# Patient Record
Sex: Female | Born: 1940 | Race: White | Hispanic: No | Smoking: Former smoker
Health system: Southern US, Community
[De-identification: ages and names within clinical notes are randomized; demographics above are authoritative.]

## PROBLEM LIST (undated history)

## (undated) DIAGNOSIS — I071 Rheumatic tricuspid insufficiency: Secondary | ICD-10-CM

## (undated) DIAGNOSIS — R059 Cough, unspecified: Secondary | ICD-10-CM

## (undated) DIAGNOSIS — J42 Unspecified chronic bronchitis: Secondary | ICD-10-CM

## (undated) DIAGNOSIS — I4819 Other persistent atrial fibrillation: Secondary | ICD-10-CM

## (undated) DIAGNOSIS — M1990 Unspecified osteoarthritis, unspecified site: Secondary | ICD-10-CM

## (undated) DIAGNOSIS — R05 Cough: Secondary | ICD-10-CM

## (undated) DIAGNOSIS — M810 Age-related osteoporosis without current pathological fracture: Secondary | ICD-10-CM

## (undated) DIAGNOSIS — R0600 Dyspnea, unspecified: Secondary | ICD-10-CM

## (undated) DIAGNOSIS — J449 Chronic obstructive pulmonary disease, unspecified: Secondary | ICD-10-CM

## (undated) DIAGNOSIS — T7840XA Allergy, unspecified, initial encounter: Secondary | ICD-10-CM

## (undated) DIAGNOSIS — J8410 Pulmonary fibrosis, unspecified: Secondary | ICD-10-CM

## (undated) DIAGNOSIS — I10 Essential (primary) hypertension: Secondary | ICD-10-CM

## (undated) HISTORY — DX: Allergy, unspecified, initial encounter: T78.40XA

## (undated) HISTORY — DX: Pulmonary fibrosis, unspecified: J84.10

## (undated) HISTORY — DX: Essential (primary) hypertension: I10

## (undated) HISTORY — DX: Age-related osteoporosis without current pathological fracture: M81.0

## (undated) HISTORY — PX: VARICOSE VEIN SURGERY: SHX832

## (undated) HISTORY — DX: Other persistent atrial fibrillation: I48.19

## (undated) HISTORY — DX: Rheumatic tricuspid insufficiency: I07.1

## (undated) HISTORY — PX: EYE SURGERY: SHX253

## (undated) HISTORY — PX: CAPSULOTOMY: SHX379

## (undated) HISTORY — PX: DILATION AND CURETTAGE OF UTERUS: SHX78

## (undated) HISTORY — PX: BREAST SURGERY: SHX581

---

## 1994-10-01 HISTORY — PX: BREAST EXCISIONAL BIOPSY: SUR124

## 2001-06-17 ENCOUNTER — Other Ambulatory Visit: Admission: RE | Admit: 2001-06-17 | Discharge: 2001-06-17 | Payer: Self-pay | Admitting: Family Medicine

## 2006-02-15 ENCOUNTER — Ambulatory Visit: Payer: Self-pay | Admitting: General Surgery

## 2006-02-15 HISTORY — PX: COLONOSCOPY: SHX174

## 2006-02-19 ENCOUNTER — Ambulatory Visit: Payer: Self-pay | Admitting: Family Medicine

## 2006-09-11 ENCOUNTER — Ambulatory Visit: Payer: Self-pay | Admitting: Specialist

## 2007-10-29 ENCOUNTER — Ambulatory Visit: Payer: Self-pay | Admitting: Internal Medicine

## 2007-11-13 ENCOUNTER — Ambulatory Visit: Payer: Self-pay | Admitting: Internal Medicine

## 2007-11-28 ENCOUNTER — Ambulatory Visit: Payer: Self-pay | Admitting: Internal Medicine

## 2008-01-26 ENCOUNTER — Ambulatory Visit: Payer: Self-pay | Admitting: Internal Medicine

## 2008-03-09 ENCOUNTER — Ambulatory Visit: Payer: Self-pay | Admitting: Family Medicine

## 2008-07-27 ENCOUNTER — Ambulatory Visit: Payer: Self-pay | Admitting: Internal Medicine

## 2009-04-28 ENCOUNTER — Ambulatory Visit: Payer: Self-pay | Admitting: Family Medicine

## 2010-09-05 ENCOUNTER — Ambulatory Visit: Payer: Self-pay | Admitting: Family Medicine

## 2011-10-05 DIAGNOSIS — M25519 Pain in unspecified shoulder: Secondary | ICD-10-CM | POA: Diagnosis not present

## 2011-10-05 DIAGNOSIS — M533 Sacrococcygeal disorders, not elsewhere classified: Secondary | ICD-10-CM | POA: Diagnosis not present

## 2011-10-05 DIAGNOSIS — M719 Bursopathy, unspecified: Secondary | ICD-10-CM | POA: Diagnosis not present

## 2011-10-05 DIAGNOSIS — M67919 Unspecified disorder of synovium and tendon, unspecified shoulder: Secondary | ICD-10-CM | POA: Diagnosis not present

## 2011-10-05 DIAGNOSIS — S20229A Contusion of unspecified back wall of thorax, initial encounter: Secondary | ICD-10-CM | POA: Diagnosis not present

## 2011-10-15 ENCOUNTER — Ambulatory Visit: Payer: Self-pay | Admitting: Family Medicine

## 2011-10-15 DIAGNOSIS — Z1231 Encounter for screening mammogram for malignant neoplasm of breast: Secondary | ICD-10-CM | POA: Diagnosis not present

## 2011-10-25 DIAGNOSIS — R05 Cough: Secondary | ICD-10-CM | POA: Diagnosis not present

## 2011-10-25 DIAGNOSIS — J309 Allergic rhinitis, unspecified: Secondary | ICD-10-CM | POA: Diagnosis not present

## 2011-10-25 DIAGNOSIS — J8410 Pulmonary fibrosis, unspecified: Secondary | ICD-10-CM | POA: Diagnosis not present

## 2011-10-31 DIAGNOSIS — R0602 Shortness of breath: Secondary | ICD-10-CM | POA: Diagnosis not present

## 2011-11-02 DIAGNOSIS — M6281 Muscle weakness (generalized): Secondary | ICD-10-CM | POA: Diagnosis not present

## 2011-11-02 DIAGNOSIS — M67919 Unspecified disorder of synovium and tendon, unspecified shoulder: Secondary | ICD-10-CM | POA: Diagnosis not present

## 2011-11-02 DIAGNOSIS — M7530 Calcific tendinitis of unspecified shoulder: Secondary | ICD-10-CM | POA: Diagnosis not present

## 2011-11-02 DIAGNOSIS — M25519 Pain in unspecified shoulder: Secondary | ICD-10-CM | POA: Diagnosis not present

## 2011-11-09 DIAGNOSIS — M719 Bursopathy, unspecified: Secondary | ICD-10-CM | POA: Diagnosis not present

## 2011-11-09 DIAGNOSIS — M25519 Pain in unspecified shoulder: Secondary | ICD-10-CM | POA: Diagnosis not present

## 2011-11-09 DIAGNOSIS — M6281 Muscle weakness (generalized): Secondary | ICD-10-CM | POA: Diagnosis not present

## 2011-11-09 DIAGNOSIS — M25619 Stiffness of unspecified shoulder, not elsewhere classified: Secondary | ICD-10-CM | POA: Diagnosis not present

## 2011-11-13 DIAGNOSIS — J019 Acute sinusitis, unspecified: Secondary | ICD-10-CM | POA: Diagnosis not present

## 2011-11-13 DIAGNOSIS — R05 Cough: Secondary | ICD-10-CM | POA: Diagnosis not present

## 2011-11-13 DIAGNOSIS — J309 Allergic rhinitis, unspecified: Secondary | ICD-10-CM | POA: Diagnosis not present

## 2011-11-16 DIAGNOSIS — M6281 Muscle weakness (generalized): Secondary | ICD-10-CM | POA: Diagnosis not present

## 2011-11-16 DIAGNOSIS — M25519 Pain in unspecified shoulder: Secondary | ICD-10-CM | POA: Diagnosis not present

## 2011-11-16 DIAGNOSIS — M25619 Stiffness of unspecified shoulder, not elsewhere classified: Secondary | ICD-10-CM | POA: Diagnosis not present

## 2011-11-20 DIAGNOSIS — M67919 Unspecified disorder of synovium and tendon, unspecified shoulder: Secondary | ICD-10-CM | POA: Diagnosis not present

## 2011-11-20 DIAGNOSIS — M25519 Pain in unspecified shoulder: Secondary | ICD-10-CM | POA: Diagnosis not present

## 2011-11-20 DIAGNOSIS — M6281 Muscle weakness (generalized): Secondary | ICD-10-CM | POA: Diagnosis not present

## 2011-11-20 DIAGNOSIS — M25619 Stiffness of unspecified shoulder, not elsewhere classified: Secondary | ICD-10-CM | POA: Diagnosis not present

## 2011-11-20 DIAGNOSIS — M719 Bursopathy, unspecified: Secondary | ICD-10-CM | POA: Diagnosis not present

## 2011-11-23 DIAGNOSIS — M25619 Stiffness of unspecified shoulder, not elsewhere classified: Secondary | ICD-10-CM | POA: Diagnosis not present

## 2011-11-23 DIAGNOSIS — M67919 Unspecified disorder of synovium and tendon, unspecified shoulder: Secondary | ICD-10-CM | POA: Diagnosis not present

## 2011-11-23 DIAGNOSIS — M25519 Pain in unspecified shoulder: Secondary | ICD-10-CM | POA: Diagnosis not present

## 2011-11-23 DIAGNOSIS — M6281 Muscle weakness (generalized): Secondary | ICD-10-CM | POA: Diagnosis not present

## 2011-11-30 DIAGNOSIS — J309 Allergic rhinitis, unspecified: Secondary | ICD-10-CM | POA: Diagnosis not present

## 2011-11-30 DIAGNOSIS — J069 Acute upper respiratory infection, unspecified: Secondary | ICD-10-CM | POA: Diagnosis not present

## 2011-11-30 DIAGNOSIS — R05 Cough: Secondary | ICD-10-CM | POA: Diagnosis not present

## 2011-12-11 DIAGNOSIS — J309 Allergic rhinitis, unspecified: Secondary | ICD-10-CM | POA: Diagnosis not present

## 2011-12-12 DIAGNOSIS — M7530 Calcific tendinitis of unspecified shoulder: Secondary | ICD-10-CM | POA: Diagnosis not present

## 2011-12-12 DIAGNOSIS — M6281 Muscle weakness (generalized): Secondary | ICD-10-CM | POA: Diagnosis not present

## 2011-12-12 DIAGNOSIS — M25519 Pain in unspecified shoulder: Secondary | ICD-10-CM | POA: Diagnosis not present

## 2011-12-12 DIAGNOSIS — M719 Bursopathy, unspecified: Secondary | ICD-10-CM | POA: Diagnosis not present

## 2011-12-12 DIAGNOSIS — M67919 Unspecified disorder of synovium and tendon, unspecified shoulder: Secondary | ICD-10-CM | POA: Diagnosis not present

## 2012-01-24 DIAGNOSIS — J309 Allergic rhinitis, unspecified: Secondary | ICD-10-CM | POA: Diagnosis not present

## 2012-01-24 DIAGNOSIS — J8410 Pulmonary fibrosis, unspecified: Secondary | ICD-10-CM | POA: Diagnosis not present

## 2012-01-24 DIAGNOSIS — R05 Cough: Secondary | ICD-10-CM | POA: Diagnosis not present

## 2012-02-14 DIAGNOSIS — J309 Allergic rhinitis, unspecified: Secondary | ICD-10-CM | POA: Diagnosis not present

## 2012-03-12 DIAGNOSIS — J309 Allergic rhinitis, unspecified: Secondary | ICD-10-CM | POA: Diagnosis not present

## 2012-03-31 DIAGNOSIS — R05 Cough: Secondary | ICD-10-CM | POA: Diagnosis not present

## 2012-03-31 DIAGNOSIS — J309 Allergic rhinitis, unspecified: Secondary | ICD-10-CM | POA: Diagnosis not present

## 2012-03-31 DIAGNOSIS — J019 Acute sinusitis, unspecified: Secondary | ICD-10-CM | POA: Diagnosis not present

## 2012-04-08 DIAGNOSIS — J019 Acute sinusitis, unspecified: Secondary | ICD-10-CM | POA: Diagnosis not present

## 2012-04-08 DIAGNOSIS — R05 Cough: Secondary | ICD-10-CM | POA: Diagnosis not present

## 2012-04-24 DIAGNOSIS — J309 Allergic rhinitis, unspecified: Secondary | ICD-10-CM | POA: Diagnosis not present

## 2012-05-06 DIAGNOSIS — J309 Allergic rhinitis, unspecified: Secondary | ICD-10-CM | POA: Diagnosis not present

## 2012-06-27 DIAGNOSIS — Z23 Encounter for immunization: Secondary | ICD-10-CM | POA: Diagnosis not present

## 2012-08-20 DIAGNOSIS — J309 Allergic rhinitis, unspecified: Secondary | ICD-10-CM | POA: Diagnosis not present

## 2012-08-20 DIAGNOSIS — J8410 Pulmonary fibrosis, unspecified: Secondary | ICD-10-CM | POA: Diagnosis not present

## 2012-09-11 DIAGNOSIS — M25559 Pain in unspecified hip: Secondary | ICD-10-CM | POA: Diagnosis not present

## 2012-09-11 DIAGNOSIS — G5600 Carpal tunnel syndrome, unspecified upper limb: Secondary | ICD-10-CM | POA: Diagnosis not present

## 2012-09-11 DIAGNOSIS — M545 Low back pain: Secondary | ICD-10-CM | POA: Diagnosis not present

## 2012-09-11 DIAGNOSIS — M19049 Primary osteoarthritis, unspecified hand: Secondary | ICD-10-CM | POA: Diagnosis not present

## 2012-10-14 DIAGNOSIS — J309 Allergic rhinitis, unspecified: Secondary | ICD-10-CM | POA: Diagnosis not present

## 2012-10-14 DIAGNOSIS — R05 Cough: Secondary | ICD-10-CM | POA: Diagnosis not present

## 2012-10-14 DIAGNOSIS — J069 Acute upper respiratory infection, unspecified: Secondary | ICD-10-CM | POA: Diagnosis not present

## 2012-10-16 DIAGNOSIS — H35379 Puckering of macula, unspecified eye: Secondary | ICD-10-CM | POA: Diagnosis not present

## 2012-10-31 DIAGNOSIS — M545 Low back pain: Secondary | ICD-10-CM | POA: Diagnosis not present

## 2012-10-31 DIAGNOSIS — M25549 Pain in joints of unspecified hand: Secondary | ICD-10-CM | POA: Diagnosis not present

## 2012-10-31 DIAGNOSIS — G5600 Carpal tunnel syndrome, unspecified upper limb: Secondary | ICD-10-CM | POA: Diagnosis not present

## 2012-11-07 DIAGNOSIS — H35379 Puckering of macula, unspecified eye: Secondary | ICD-10-CM | POA: Diagnosis not present

## 2012-11-20 ENCOUNTER — Encounter: Payer: Self-pay | Admitting: Rheumatology

## 2012-11-20 ENCOUNTER — Ambulatory Visit: Payer: Self-pay | Admitting: Family Medicine

## 2012-11-20 DIAGNOSIS — Z1231 Encounter for screening mammogram for malignant neoplasm of breast: Secondary | ICD-10-CM | POA: Diagnosis not present

## 2012-11-28 DIAGNOSIS — M19049 Primary osteoarthritis, unspecified hand: Secondary | ICD-10-CM | POA: Diagnosis not present

## 2012-11-29 ENCOUNTER — Encounter: Payer: Self-pay | Admitting: Rheumatology

## 2013-02-09 DIAGNOSIS — H35379 Puckering of macula, unspecified eye: Secondary | ICD-10-CM | POA: Diagnosis not present

## 2013-02-12 DIAGNOSIS — H35379 Puckering of macula, unspecified eye: Secondary | ICD-10-CM | POA: Diagnosis not present

## 2013-02-25 ENCOUNTER — Ambulatory Visit: Payer: Self-pay | Admitting: Internal Medicine

## 2013-02-25 DIAGNOSIS — R918 Other nonspecific abnormal finding of lung field: Secondary | ICD-10-CM | POA: Diagnosis not present

## 2013-02-25 DIAGNOSIS — J8410 Pulmonary fibrosis, unspecified: Secondary | ICD-10-CM | POA: Diagnosis not present

## 2013-02-25 DIAGNOSIS — J309 Allergic rhinitis, unspecified: Secondary | ICD-10-CM | POA: Diagnosis not present

## 2013-02-25 DIAGNOSIS — R062 Wheezing: Secondary | ICD-10-CM | POA: Diagnosis not present

## 2013-02-25 DIAGNOSIS — J069 Acute upper respiratory infection, unspecified: Secondary | ICD-10-CM | POA: Diagnosis not present

## 2013-03-04 DIAGNOSIS — J020 Streptococcal pharyngitis: Secondary | ICD-10-CM | POA: Diagnosis not present

## 2013-03-04 DIAGNOSIS — J069 Acute upper respiratory infection, unspecified: Secondary | ICD-10-CM | POA: Diagnosis not present

## 2013-03-25 DIAGNOSIS — M779 Enthesopathy, unspecified: Secondary | ICD-10-CM | POA: Diagnosis not present

## 2013-03-25 DIAGNOSIS — M79609 Pain in unspecified limb: Secondary | ICD-10-CM | POA: Diagnosis not present

## 2013-04-02 DIAGNOSIS — M24873 Other specific joint derangements of unspecified ankle, not elsewhere classified: Secondary | ICD-10-CM | POA: Diagnosis not present

## 2013-04-02 DIAGNOSIS — M24876 Other specific joint derangements of unspecified foot, not elsewhere classified: Secondary | ICD-10-CM | POA: Diagnosis not present

## 2013-04-02 DIAGNOSIS — M76829 Posterior tibial tendinitis, unspecified leg: Secondary | ICD-10-CM | POA: Diagnosis not present

## 2013-04-02 DIAGNOSIS — M79609 Pain in unspecified limb: Secondary | ICD-10-CM | POA: Diagnosis not present

## 2013-04-16 DIAGNOSIS — M659 Synovitis and tenosynovitis, unspecified: Secondary | ICD-10-CM | POA: Diagnosis not present

## 2013-04-16 DIAGNOSIS — M79609 Pain in unspecified limb: Secondary | ICD-10-CM | POA: Diagnosis not present

## 2013-05-27 DIAGNOSIS — Z23 Encounter for immunization: Secondary | ICD-10-CM | POA: Diagnosis not present

## 2013-05-27 DIAGNOSIS — Z0000 Encounter for general adult medical examination without abnormal findings: Secondary | ICD-10-CM | POA: Diagnosis not present

## 2013-06-08 DIAGNOSIS — I10 Essential (primary) hypertension: Secondary | ICD-10-CM | POA: Diagnosis not present

## 2013-06-08 DIAGNOSIS — H35379 Puckering of macula, unspecified eye: Secondary | ICD-10-CM | POA: Diagnosis not present

## 2013-06-08 DIAGNOSIS — J309 Allergic rhinitis, unspecified: Secondary | ICD-10-CM | POA: Diagnosis not present

## 2013-06-08 DIAGNOSIS — M810 Age-related osteoporosis without current pathological fracture: Secondary | ICD-10-CM | POA: Diagnosis not present

## 2013-06-09 ENCOUNTER — Ambulatory Visit: Payer: Self-pay | Admitting: Family Medicine

## 2013-06-09 DIAGNOSIS — M810 Age-related osteoporosis without current pathological fracture: Secondary | ICD-10-CM | POA: Diagnosis not present

## 2013-06-18 LAB — LIPID PANEL
Cholesterol: 179 mg/dL (ref 0–200)
HDL: 66 mg/dL (ref 35–70)
LDL Cholesterol: 94 mg/dL
LDL/HDL RATIO: 1.4
TRIGLYCERIDES: 93 mg/dL (ref 40–160)

## 2013-06-18 LAB — BASIC METABOLIC PANEL
BUN: 13 mg/dL (ref 4–21)
Creatinine: 0.7 mg/dL (ref 0.5–1.1)
Glucose: 90 mg/dL
POTASSIUM: 4.6 mmol/L (ref 3.4–5.3)
Sodium: 137 mmol/L (ref 137–147)

## 2013-06-18 LAB — CBC AND DIFFERENTIAL
HCT: 45 % (ref 36–46)
HEMOGLOBIN: 15.1 g/dL (ref 12.0–16.0)
Neutrophils Absolute: 6 /uL
Platelets: 300 10*3/uL (ref 150–399)
WBC: 9.1 10^3/mL

## 2013-06-18 LAB — HEPATIC FUNCTION PANEL
ALK PHOS: 35 U/L (ref 25–125)
ALT: 8 U/L (ref 7–35)
AST: 18 U/L (ref 13–35)
BILIRUBIN, TOTAL: 1 mg/dL

## 2013-06-18 LAB — TSH: TSH: 2.15 u[IU]/mL (ref 0.41–5.90)

## 2013-07-16 ENCOUNTER — Ambulatory Visit: Payer: Self-pay | Admitting: Ophthalmology

## 2013-07-16 DIAGNOSIS — Z79899 Other long term (current) drug therapy: Secondary | ICD-10-CM | POA: Diagnosis not present

## 2013-07-16 DIAGNOSIS — Z01812 Encounter for preprocedural laboratory examination: Secondary | ICD-10-CM | POA: Diagnosis not present

## 2013-07-16 DIAGNOSIS — Z01810 Encounter for preprocedural cardiovascular examination: Secondary | ICD-10-CM | POA: Diagnosis not present

## 2013-07-16 DIAGNOSIS — I10 Essential (primary) hypertension: Secondary | ICD-10-CM | POA: Diagnosis not present

## 2013-07-16 DIAGNOSIS — Z23 Encounter for immunization: Secondary | ICD-10-CM | POA: Diagnosis not present

## 2013-07-16 LAB — POTASSIUM: Potassium: 4.4 mmol/L (ref 3.5–5.1)

## 2013-07-29 ENCOUNTER — Ambulatory Visit: Payer: Self-pay | Admitting: Ophthalmology

## 2013-07-29 DIAGNOSIS — H35379 Puckering of macula, unspecified eye: Secondary | ICD-10-CM | POA: Diagnosis not present

## 2013-07-29 DIAGNOSIS — M810 Age-related osteoporosis without current pathological fracture: Secondary | ICD-10-CM | POA: Diagnosis not present

## 2013-07-29 DIAGNOSIS — M129 Arthropathy, unspecified: Secondary | ICD-10-CM | POA: Diagnosis not present

## 2013-07-29 DIAGNOSIS — Z79899 Other long term (current) drug therapy: Secondary | ICD-10-CM | POA: Diagnosis not present

## 2013-07-29 DIAGNOSIS — I10 Essential (primary) hypertension: Secondary | ICD-10-CM | POA: Diagnosis not present

## 2013-07-29 DIAGNOSIS — H3581 Retinal edema: Secondary | ICD-10-CM | POA: Diagnosis not present

## 2013-07-29 DIAGNOSIS — J8410 Pulmonary fibrosis, unspecified: Secondary | ICD-10-CM | POA: Diagnosis not present

## 2013-08-07 DIAGNOSIS — H35379 Puckering of macula, unspecified eye: Secondary | ICD-10-CM | POA: Diagnosis not present

## 2013-09-07 DIAGNOSIS — J309 Allergic rhinitis, unspecified: Secondary | ICD-10-CM | POA: Diagnosis not present

## 2013-09-07 DIAGNOSIS — J8410 Pulmonary fibrosis, unspecified: Secondary | ICD-10-CM | POA: Diagnosis not present

## 2013-09-14 DIAGNOSIS — H35379 Puckering of macula, unspecified eye: Secondary | ICD-10-CM | POA: Diagnosis not present

## 2013-10-14 DIAGNOSIS — J309 Allergic rhinitis, unspecified: Secondary | ICD-10-CM | POA: Diagnosis not present

## 2013-10-14 DIAGNOSIS — R05 Cough: Secondary | ICD-10-CM | POA: Diagnosis not present

## 2013-10-14 DIAGNOSIS — R059 Cough, unspecified: Secondary | ICD-10-CM | POA: Diagnosis not present

## 2013-10-14 DIAGNOSIS — J069 Acute upper respiratory infection, unspecified: Secondary | ICD-10-CM | POA: Diagnosis not present

## 2013-10-14 DIAGNOSIS — J42 Unspecified chronic bronchitis: Secondary | ICD-10-CM | POA: Diagnosis not present

## 2013-11-13 DIAGNOSIS — H353190 Nonexudative age-related macular degeneration, unspecified eye, stage unspecified: Secondary | ICD-10-CM | POA: Diagnosis not present

## 2014-01-13 DIAGNOSIS — M1990 Unspecified osteoarthritis, unspecified site: Secondary | ICD-10-CM | POA: Diagnosis not present

## 2014-01-13 DIAGNOSIS — J309 Allergic rhinitis, unspecified: Secondary | ICD-10-CM | POA: Diagnosis not present

## 2014-02-16 DIAGNOSIS — H3581 Retinal edema: Secondary | ICD-10-CM | POA: Diagnosis not present

## 2014-03-03 ENCOUNTER — Ambulatory Visit: Payer: Self-pay | Admitting: Family Medicine

## 2014-03-03 DIAGNOSIS — Z1231 Encounter for screening mammogram for malignant neoplasm of breast: Secondary | ICD-10-CM | POA: Diagnosis not present

## 2014-03-08 DIAGNOSIS — J309 Allergic rhinitis, unspecified: Secondary | ICD-10-CM | POA: Diagnosis not present

## 2014-03-08 DIAGNOSIS — J8410 Pulmonary fibrosis, unspecified: Secondary | ICD-10-CM | POA: Diagnosis not present

## 2014-03-12 DIAGNOSIS — H2510 Age-related nuclear cataract, unspecified eye: Secondary | ICD-10-CM | POA: Diagnosis not present

## 2014-07-08 DIAGNOSIS — J209 Acute bronchitis, unspecified: Secondary | ICD-10-CM | POA: Diagnosis not present

## 2014-07-08 DIAGNOSIS — J301 Allergic rhinitis due to pollen: Secondary | ICD-10-CM | POA: Diagnosis not present

## 2014-07-08 DIAGNOSIS — J8410 Pulmonary fibrosis, unspecified: Secondary | ICD-10-CM | POA: Diagnosis not present

## 2014-07-19 ENCOUNTER — Ambulatory Visit: Payer: Self-pay | Admitting: Internal Medicine

## 2014-07-19 DIAGNOSIS — R0602 Shortness of breath: Secondary | ICD-10-CM | POA: Diagnosis not present

## 2014-07-19 DIAGNOSIS — J209 Acute bronchitis, unspecified: Secondary | ICD-10-CM | POA: Diagnosis not present

## 2014-07-19 DIAGNOSIS — R918 Other nonspecific abnormal finding of lung field: Secondary | ICD-10-CM | POA: Diagnosis not present

## 2014-07-19 DIAGNOSIS — J8410 Pulmonary fibrosis, unspecified: Secondary | ICD-10-CM | POA: Diagnosis not present

## 2014-07-19 DIAGNOSIS — R05 Cough: Secondary | ICD-10-CM | POA: Diagnosis not present

## 2014-07-26 DIAGNOSIS — J209 Acute bronchitis, unspecified: Secondary | ICD-10-CM | POA: Diagnosis not present

## 2014-07-26 DIAGNOSIS — J8410 Pulmonary fibrosis, unspecified: Secondary | ICD-10-CM | POA: Diagnosis not present

## 2014-07-26 DIAGNOSIS — Z23 Encounter for immunization: Secondary | ICD-10-CM | POA: Diagnosis not present

## 2014-07-26 DIAGNOSIS — J181 Lobar pneumonia, unspecified organism: Secondary | ICD-10-CM | POA: Diagnosis not present

## 2014-08-09 ENCOUNTER — Ambulatory Visit: Payer: Self-pay | Admitting: Internal Medicine

## 2014-08-09 DIAGNOSIS — M8588 Other specified disorders of bone density and structure, other site: Secondary | ICD-10-CM | POA: Diagnosis not present

## 2014-08-09 DIAGNOSIS — J189 Pneumonia, unspecified organism: Secondary | ICD-10-CM | POA: Diagnosis not present

## 2014-08-09 DIAGNOSIS — R062 Wheezing: Secondary | ICD-10-CM | POA: Diagnosis not present

## 2014-08-19 DIAGNOSIS — H2513 Age-related nuclear cataract, bilateral: Secondary | ICD-10-CM | POA: Diagnosis not present

## 2014-08-19 DIAGNOSIS — H3581 Retinal edema: Secondary | ICD-10-CM | POA: Diagnosis not present

## 2014-10-04 DIAGNOSIS — J301 Allergic rhinitis due to pollen: Secondary | ICD-10-CM | POA: Diagnosis not present

## 2014-10-04 DIAGNOSIS — R05 Cough: Secondary | ICD-10-CM | POA: Diagnosis not present

## 2014-10-04 DIAGNOSIS — J8410 Pulmonary fibrosis, unspecified: Secondary | ICD-10-CM | POA: Diagnosis not present

## 2014-10-21 DIAGNOSIS — Z23 Encounter for immunization: Secondary | ICD-10-CM | POA: Diagnosis not present

## 2014-10-21 DIAGNOSIS — R0602 Shortness of breath: Secondary | ICD-10-CM | POA: Diagnosis not present

## 2014-10-21 DIAGNOSIS — R05 Cough: Secondary | ICD-10-CM | POA: Diagnosis not present

## 2014-10-21 DIAGNOSIS — J181 Lobar pneumonia, unspecified organism: Secondary | ICD-10-CM | POA: Diagnosis not present

## 2014-11-04 DIAGNOSIS — J8410 Pulmonary fibrosis, unspecified: Secondary | ICD-10-CM | POA: Diagnosis not present

## 2014-11-04 DIAGNOSIS — R05 Cough: Secondary | ICD-10-CM | POA: Diagnosis not present

## 2014-11-04 DIAGNOSIS — R0602 Shortness of breath: Secondary | ICD-10-CM | POA: Diagnosis not present

## 2015-01-05 DIAGNOSIS — J8410 Pulmonary fibrosis, unspecified: Secondary | ICD-10-CM | POA: Diagnosis not present

## 2015-01-05 DIAGNOSIS — J0191 Acute recurrent sinusitis, unspecified: Secondary | ICD-10-CM | POA: Diagnosis not present

## 2015-01-05 DIAGNOSIS — J301 Allergic rhinitis due to pollen: Secondary | ICD-10-CM | POA: Diagnosis not present

## 2015-01-10 ENCOUNTER — Ambulatory Visit
Admit: 2015-01-10 | Disposition: A | Discharge status: Home / Self Care | Payer: Self-pay | Attending: Internal Medicine | Admitting: Internal Medicine

## 2015-01-10 DIAGNOSIS — R0602 Shortness of breath: Secondary | ICD-10-CM | POA: Diagnosis not present

## 2015-01-10 DIAGNOSIS — R918 Other nonspecific abnormal finding of lung field: Secondary | ICD-10-CM | POA: Diagnosis not present

## 2015-01-10 DIAGNOSIS — J8410 Pulmonary fibrosis, unspecified: Secondary | ICD-10-CM | POA: Diagnosis not present

## 2015-01-10 DIAGNOSIS — R05 Cough: Secondary | ICD-10-CM | POA: Diagnosis not present

## 2015-01-17 DIAGNOSIS — J8410 Pulmonary fibrosis, unspecified: Secondary | ICD-10-CM | POA: Diagnosis not present

## 2015-01-17 DIAGNOSIS — J181 Lobar pneumonia, unspecified organism: Secondary | ICD-10-CM | POA: Diagnosis not present

## 2015-01-17 DIAGNOSIS — J301 Allergic rhinitis due to pollen: Secondary | ICD-10-CM | POA: Diagnosis not present

## 2015-01-17 DIAGNOSIS — R0602 Shortness of breath: Secondary | ICD-10-CM | POA: Diagnosis not present

## 2015-01-21 NOTE — Op Note (Signed)
PATIENT NAME:  Karen Henson, Karen Henson MR#:  [id number] DATE OF BIRTH:  1941/05/12  DATE OF PROCEDURE:  07/29/2013  PROCEDURE PERFORMED: 1.  Pars plana vitrectomy of the left eye.  2.  Internal limiting membrane peel of the left eye.   PREOPERATIVE DIAGNOSES: 1.  Epiretinal membrane.  2.  Retinal edema.   POSTOPERATIVE DIAGNOSES: 1.  Epiretinal membrane.  2.  Retinal edema.    PRIMARY SURGEON: Teresa Pelton. Starling Manns, M.D.   ANESTHESIA: Retrobulbar block of the left eye with monitored anesthesia care.   COMPLICATIONS: None.   ESTIMATED BLOOD LOSS: Less than 1 mL.   INDICATIONS FOR PROCEDURE: The patient presented to my office with slowly decreasing vision in the left eye to 20/50 or worse. The patient noted increasing difficulty with reading. Examination revealed an epiretinal membrane with associated retinal edema. Risks, benefits, and alternatives of the above procedure were discussed and the patient wished to proceed.   DETAILS OF PROCEDURE: After informed consent was obtained the patient was brought into the operative suite at [facility]. The patient was placed in supine position. A small dose of Alfenta and was given to the patient by anesthesia. A retrobulbar block was performed on the left eye by the primary surgeon without any complications. The left eye was prepped and draped in sterile manner. After lid speculum was inserted, a 25-gauge trocar was placed inferotemporally through displaced conjunctiva 4 mm beyond the limbus in an oblique fashion. The infusion cannula was turned on and inserted through the trocar and secured in position with Steri-Strips. Two more trocars were placed in a similar fashion superotemporally and superonasally. The vitreous cutter and light pipe were introduced into the eye and a core vitrectomy was performed. Vitreous face was engaged using suction and elevated off of the retina. The vitreous face was removed and the peripheral vitreous was  trimmed for 360 degrees indocyanine green was injected onto the posterior pole and removed within 30 seconds. An internal limiting membrane peel was performed for 360 degrees around the fovea for a total diameter of two disk diameters. A scleral depressed exam was performed for 360 degrees and no signs of any breaks, tears, or retinal detachment could be identified. A complete air-fluid exchange was performed. The trocars were removed and the wounds were noted to be airtight. Pressure in the eye was confirmed to be approximately 15 mmHg 5 mg of dexamethasone was given into the inferior fornix and the lid speculum was removed. The eye was cleaned and TobraDex was placed on the eye. A patch and shield were placed over the eye and the patient was taken to postanesthesia care with instructions to remain face down for today and then head up after that.    ____________________________ Teresa Pelton. Starling Manns, MD mfa:dp D: 07/29/2013 07:59:34 ET T: 07/29/2013 08:08:21 ET JOB#: [id number]  cc: Teresa Pelton. Starling Manns, MD, <Dictator> Coralee Rud MD ELECTRONICALLY SIGNED 09/02/2013 8:36

## 2015-02-02 DIAGNOSIS — R0602 Shortness of breath: Secondary | ICD-10-CM | POA: Diagnosis not present

## 2015-03-21 DIAGNOSIS — H2513 Age-related nuclear cataract, bilateral: Secondary | ICD-10-CM | POA: Diagnosis not present

## 2015-03-21 DIAGNOSIS — H35372 Puckering of macula, left eye: Secondary | ICD-10-CM | POA: Diagnosis not present

## 2015-05-14 DIAGNOSIS — I10 Essential (primary) hypertension: Secondary | ICD-10-CM | POA: Diagnosis not present

## 2015-05-14 DIAGNOSIS — W19XXXA Unspecified fall, initial encounter: Secondary | ICD-10-CM | POA: Diagnosis not present

## 2015-05-14 DIAGNOSIS — Z6823 Body mass index (BMI) 23.0-23.9, adult: Secondary | ICD-10-CM | POA: Diagnosis not present

## 2015-05-14 DIAGNOSIS — M7989 Other specified soft tissue disorders: Secondary | ICD-10-CM | POA: Diagnosis not present

## 2015-05-14 DIAGNOSIS — S6991XA Unspecified injury of right wrist, hand and finger(s), initial encounter: Secondary | ICD-10-CM | POA: Diagnosis not present

## 2015-05-14 DIAGNOSIS — J8410 Pulmonary fibrosis, unspecified: Secondary | ICD-10-CM | POA: Diagnosis not present

## 2015-05-14 DIAGNOSIS — M79641 Pain in right hand: Secondary | ICD-10-CM | POA: Diagnosis not present

## 2015-06-16 DIAGNOSIS — J8410 Pulmonary fibrosis, unspecified: Secondary | ICD-10-CM | POA: Diagnosis not present

## 2015-06-16 DIAGNOSIS — R0602 Shortness of breath: Secondary | ICD-10-CM | POA: Diagnosis not present

## 2015-08-01 DIAGNOSIS — Z23 Encounter for immunization: Secondary | ICD-10-CM | POA: Diagnosis not present

## 2015-08-22 ENCOUNTER — Other Ambulatory Visit: Payer: Self-pay | Admitting: Family Medicine

## 2015-08-31 DIAGNOSIS — J8410 Pulmonary fibrosis, unspecified: Secondary | ICD-10-CM | POA: Diagnosis not present

## 2015-08-31 DIAGNOSIS — J301 Allergic rhinitis due to pollen: Secondary | ICD-10-CM | POA: Diagnosis not present

## 2015-08-31 DIAGNOSIS — J209 Acute bronchitis, unspecified: Secondary | ICD-10-CM | POA: Diagnosis not present

## 2015-09-28 DIAGNOSIS — J8410 Pulmonary fibrosis, unspecified: Secondary | ICD-10-CM | POA: Diagnosis not present

## 2015-09-28 DIAGNOSIS — R0602 Shortness of breath: Secondary | ICD-10-CM | POA: Diagnosis not present

## 2015-11-09 DIAGNOSIS — M810 Age-related osteoporosis without current pathological fracture: Secondary | ICD-10-CM | POA: Insufficient documentation

## 2015-11-09 DIAGNOSIS — J309 Allergic rhinitis, unspecified: Secondary | ICD-10-CM | POA: Insufficient documentation

## 2015-11-09 DIAGNOSIS — I10 Essential (primary) hypertension: Secondary | ICD-10-CM | POA: Insufficient documentation

## 2015-11-09 DIAGNOSIS — J8410 Pulmonary fibrosis, unspecified: Secondary | ICD-10-CM | POA: Insufficient documentation

## 2015-11-09 DIAGNOSIS — M1990 Unspecified osteoarthritis, unspecified site: Secondary | ICD-10-CM | POA: Insufficient documentation

## 2015-11-09 DIAGNOSIS — K5790 Diverticulosis of intestine, part unspecified, without perforation or abscess without bleeding: Secondary | ICD-10-CM | POA: Insufficient documentation

## 2015-11-28 DIAGNOSIS — R0602 Shortness of breath: Secondary | ICD-10-CM | POA: Diagnosis not present

## 2015-11-28 DIAGNOSIS — J301 Allergic rhinitis due to pollen: Secondary | ICD-10-CM | POA: Diagnosis not present

## 2015-11-28 DIAGNOSIS — R05 Cough: Secondary | ICD-10-CM | POA: Diagnosis not present

## 2015-11-28 DIAGNOSIS — J0191 Acute recurrent sinusitis, unspecified: Secondary | ICD-10-CM | POA: Diagnosis not present

## 2015-12-01 ENCOUNTER — Other Ambulatory Visit: Payer: Self-pay | Admitting: Internal Medicine

## 2015-12-01 ENCOUNTER — Ambulatory Visit
Admission: RE | Admit: 2015-12-01 | Discharge: 2015-12-01 | Disposition: A | Discharge status: Home / Self Care | Payer: Medicare Other | Source: Ambulatory Visit | Attending: Internal Medicine | Admitting: Internal Medicine

## 2015-12-01 ENCOUNTER — Ambulatory Visit: Admission: RE | Admit: 2015-12-01 | Payer: Medicare Other | Source: Ambulatory Visit

## 2015-12-01 DIAGNOSIS — J209 Acute bronchitis, unspecified: Secondary | ICD-10-CM | POA: Diagnosis not present

## 2015-12-01 DIAGNOSIS — R059 Cough, unspecified: Secondary | ICD-10-CM

## 2015-12-01 DIAGNOSIS — R05 Cough: Secondary | ICD-10-CM

## 2015-12-08 DIAGNOSIS — R0602 Shortness of breath: Secondary | ICD-10-CM | POA: Diagnosis not present

## 2015-12-08 DIAGNOSIS — J301 Allergic rhinitis due to pollen: Secondary | ICD-10-CM | POA: Diagnosis not present

## 2015-12-08 DIAGNOSIS — J8410 Pulmonary fibrosis, unspecified: Secondary | ICD-10-CM | POA: Diagnosis not present

## 2015-12-08 DIAGNOSIS — J209 Acute bronchitis, unspecified: Secondary | ICD-10-CM | POA: Diagnosis not present

## 2015-12-20 DIAGNOSIS — J8410 Pulmonary fibrosis, unspecified: Secondary | ICD-10-CM | POA: Diagnosis not present

## 2015-12-20 DIAGNOSIS — J301 Allergic rhinitis due to pollen: Secondary | ICD-10-CM | POA: Diagnosis not present

## 2015-12-20 DIAGNOSIS — J069 Acute upper respiratory infection, unspecified: Secondary | ICD-10-CM | POA: Diagnosis not present

## 2015-12-20 DIAGNOSIS — R0602 Shortness of breath: Secondary | ICD-10-CM | POA: Diagnosis not present

## 2015-12-30 ENCOUNTER — Encounter: Payer: Self-pay | Admitting: Family Medicine

## 2015-12-30 DIAGNOSIS — R05 Cough: Secondary | ICD-10-CM | POA: Diagnosis not present

## 2016-01-03 DIAGNOSIS — R0602 Shortness of breath: Secondary | ICD-10-CM | POA: Diagnosis not present

## 2016-01-03 DIAGNOSIS — J8410 Pulmonary fibrosis, unspecified: Secondary | ICD-10-CM | POA: Diagnosis not present

## 2016-01-03 DIAGNOSIS — J301 Allergic rhinitis due to pollen: Secondary | ICD-10-CM | POA: Diagnosis not present

## 2016-01-30 ENCOUNTER — Ambulatory Visit (INDEPENDENT_AMBULATORY_CARE_PROVIDER_SITE_OTHER): Payer: Medicare Other | Admitting: Family Medicine

## 2016-01-30 ENCOUNTER — Encounter: Payer: Self-pay | Admitting: Family Medicine

## 2016-01-30 VITALS — BP 120/86 | HR 68 | Temp 98.1°F | Resp 16 | Ht 62.0 in | Wt 132.0 lb

## 2016-01-30 DIAGNOSIS — Z0000 Encounter for general adult medical examination without abnormal findings: Secondary | ICD-10-CM

## 2016-01-30 DIAGNOSIS — N3001 Acute cystitis with hematuria: Secondary | ICD-10-CM

## 2016-01-30 DIAGNOSIS — J302 Other seasonal allergic rhinitis: Secondary | ICD-10-CM

## 2016-01-30 DIAGNOSIS — Z1231 Encounter for screening mammogram for malignant neoplasm of breast: Secondary | ICD-10-CM

## 2016-01-30 DIAGNOSIS — I10 Essential (primary) hypertension: Secondary | ICD-10-CM

## 2016-01-30 DIAGNOSIS — Z1211 Encounter for screening for malignant neoplasm of colon: Secondary | ICD-10-CM | POA: Diagnosis not present

## 2016-01-30 DIAGNOSIS — Z780 Asymptomatic menopausal state: Secondary | ICD-10-CM

## 2016-01-30 NOTE — Progress Notes (Signed)
Patient ID: Karen Henson, female   DOB: 1941/03/18, 75 y.o.   MRN: [id number]       Patient: Karen Henson, Female    DOB: June 10, 1941, 70 y.o.   MRN: [id number] Visit Date: 01/30/2016  Today's Provider: Margarita Rana, MD   Chief Complaint  Patient presents with  . Medicare Wellness  . Urinary Tract Infection   Subjective:    Annual wellness visit Karen Henson is a 75 y.o. female. She feels well. She reports exercising daily. She reports she is sleeping well.  05/27/13 AWE 03/03/14 Mammogram-BI-RADS 1 06/09/13 BMD-osteoporosis 02/15/06 Colonoscopy-diverticulosis  ----------------------------------------------------------- Urinary Tract Infection: Patient complains of burning with urination She has had symptoms for 3 days. Patient also complains of none. Patient denies back pain, fever, stomach ache and vaginal discharge. Patient does not have a history of recurrent UTI.  Patient does not have a history of pyelonephritis.  Patient reports she took OTC AZO and reports mild improvement with symptoms.  Review of Systems  Constitutional: Negative.   Eyes: Negative.   Respiratory: Positive for cough and shortness of breath.   Cardiovascular: Negative.   Gastrointestinal: Negative.   Endocrine: Negative.   Genitourinary: Positive for dysuria and urgency.  Musculoskeletal: Negative.   Skin: Negative.   Allergic/Immunologic: Negative.   Neurological: Negative.   Hematological: Negative.   Psychiatric/Behavioral: Negative.     Social History   Social History  . Marital Status: Widowed    Spouse Name: N/A  . Number of Children: N/A  . Years of Education: N/A   Occupational History  . Not on file.   Social History Main Topics  . Smoking status: Never Smoker   . Smokeless tobacco: Never Used  . Alcohol Use: Yes     Comment: ocassional  . Drug Use: No  . Sexual Activity: Not on file   Other Topics Concern  . Not on file   Social History Narrative    History  reviewed. No pertinent past medical history.   Patient Active Problem List   Diagnosis Date Noted  . Allergic rhinitis 11/09/2015  . 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 BP (high blood pressure) 11/09/2015  . 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 OP (osteoporosis) 11/09/2015    Past Surgical History  Procedure Laterality Date  . Dilation and curettage of uterus    . Varicose vein surgery    . Breast lumpectomy Left     Her family history includes Emphysema in her father; Hypertension in her mother; Stroke in her brother and sister.    Previous Medications   LISINOPRIL-HYDROCHLOROTHIAZIDE (PRINZIDE,ZESTORETIC) 10-12.5 MG TABLET    TAKE 1 TABLET BY MOUTH ONCE DAILY.   LORATADINE (CLARITIN) 10 MG TABLET    Take by mouth.    Patient Care Team: Margarita Rana, MD as PCP - General (Family Medicine)     Objective:   Vitals: BP 120/86 mmHg  Pulse 68  Temp(Src) 98.1 F (36.7 C) (Oral)  Resp 16  Ht 5\' 2"  (1.575 m)  Wt 132 lb (59.875 kg)  BMI 24.14 kg/m2  SpO2 96%  Physical Exam  Constitutional: She is oriented to person, place, and time. She appears well-developed and well-nourished.  HENT:  Head: Normocephalic and atraumatic.  Right Ear: Tympanic membrane, external ear and ear canal normal.  Left Ear: Tympanic membrane, external ear and ear canal normal.  Nose: Nose normal.  Mouth/Throat: Uvula is midline, oropharynx is clear and moist and mucous membranes are normal.  Eyes: Conjunctivae, EOM and lids are normal. Pupils are equal, round, and reactive to light.  Neck: Trachea normal and normal range of motion. Neck supple. Carotid bruit is not present. No thyroid mass and no thyromegaly present.  Cardiovascular: Normal rate, regular rhythm and normal heart sounds.   Pulmonary/Chest: Effort normal and breath sounds normal.  Abdominal: Soft. Normal appearance and bowel sounds are normal. There is no hepatosplenomegaly. There is no  tenderness.  Genitourinary: No breast swelling, tenderness or discharge.  Musculoskeletal: Normal range of motion.  Lymphadenopathy:    She has no cervical adenopathy.    She has no axillary adenopathy.  Neurological: She is alert and oriented to person, place, and time. She has normal strength. No cranial nerve deficit.  Skin: Skin is warm, dry and intact.  Psychiatric: She has a normal mood and affect. Her speech is normal and behavior is normal. Judgment and thought content normal. Cognition and memory are normal.    Activities of Daily Living In your present state of health, do you have any difficulty performing the following activities: 01/30/2016  Hearing? N  Vision? N  Difficulty concentrating or making decisions? N  Walking or climbing stairs? N  Dressing or bathing? N  Doing errands, shopping? N    Fall Risk Assessment Fall Risk  01/30/2016  Falls in the past year? No     Depression Screen PHQ 2/9 Scores 01/30/2016  PHQ - 2 Score 0    Cognitive Testing - 6-CIT  Correct? Score   What year is it? yes 0 0 or 4  What month is it? yes 0 0 or 3  Memorize:    Pia Mau,  43,  [address],  [place],      What time is it? (within 1 hour) yes 0 0 or 3  Count backwards from 20 yes 0 0, 2, or 4  Name the months of the year yes 0 0, 2, or 4  Repeat name & address above yes 2 0, 2, 4, 6, 8, or 10       TOTAL SCORE  2/28   Interpretation:  Normal  Normal (0-7) Abnormal (8-28)       Assessment & Plan:     Annual Wellness Visit  Reviewed patient's Family Medical History Reviewed and updated list of patient's medical providers Assessment of cognitive impairment was done Assessed patient's functional ability Established a written schedule for health screening [place] Completed and Reviewed  Exercise Activities and Dietary recommendations Goals    None      Immunization History  Administered Date(s) Administered  . Influenza-Unspecified  08/01/2015  . Pneumococcal Conjugate-13 08/01/2015  . Td 02/26/2008  . Zoster 03/17/2009        1. Medicare annual wellness visit, subsequent Stable. Patient advised to continue eating healthy and exercise daily.  2. Encounter for screening mammogram for breast cancer - MM DIGITAL SCREENING BILATERAL; Future  3. Essential hypertension - Comprehensive metabolic panel - TSH  4. Other seasonal allergic rhinitis - CBC with Differential/Platelet  5. Acute cystitis with hematuria New problem. Patient advised to continue Azo. F/U pending lab report. - Urine culture  6. Colon cancer screening - Ambulatory referral to Gastroenterology  7. Postmenopausal - DG Bone Density; Future   Patient seen and examined by Dr. Jerrell Belfast, and note scribed by Philbert Riser. Dimas, CMA.   I have reviewed the document for accuracy and completeness and I agree with above. Jerrell Belfast, MD  Margarita Rana, MD     ------------------------------------------------------------------------------------------------------------

## 2016-01-31 ENCOUNTER — Encounter: Payer: Self-pay | Admitting: *Deleted

## 2016-02-01 ENCOUNTER — Telehealth: Payer: Self-pay

## 2016-02-01 DIAGNOSIS — N3090 Cystitis, unspecified without hematuria: Secondary | ICD-10-CM

## 2016-02-01 LAB — CBC WITH DIFFERENTIAL/PLATELET
BASOS ABS: 0 10*3/uL (ref 0.0–0.2)
Basos: 0 %
EOS (ABSOLUTE): 0.1 10*3/uL (ref 0.0–0.4)
Eos: 2 %
HEMATOCRIT: 42 % (ref 34.0–46.6)
HEMOGLOBIN: 13.7 g/dL (ref 11.1–15.9)
Immature Grans (Abs): 0 10*3/uL (ref 0.0–0.1)
Immature Granulocytes: 0 %
LYMPHS ABS: 2.2 10*3/uL (ref 0.7–3.1)
Lymphs: 26 %
MCH: 31.5 pg (ref 26.6–33.0)
MCHC: 32.6 g/dL (ref 31.5–35.7)
MCV: 97 fL (ref 79–97)
MONOS ABS: 1 10*3/uL — AB (ref 0.1–0.9)
Monocytes: 12 %
NEUTROS PCT: 60 %
Neutrophils Absolute: 5.1 10*3/uL (ref 1.4–7.0)
Platelets: 337 10*3/uL (ref 150–379)
RBC: 4.35 x10E6/uL (ref 3.77–5.28)
RDW: 14.6 % (ref 12.3–15.4)
WBC: 8.5 10*3/uL (ref 3.4–10.8)

## 2016-02-01 LAB — COMPREHENSIVE METABOLIC PANEL
A/G RATIO: 1.8 (ref 1.2–2.2)
ALBUMIN: 4.3 g/dL (ref 3.5–4.8)
ALK PHOS: 42 IU/L (ref 39–117)
ALT: 6 IU/L (ref 0–32)
AST: 15 IU/L (ref 0–40)
BILIRUBIN TOTAL: 0.4 mg/dL (ref 0.0–1.2)
BUN / CREAT RATIO: 15 (ref 12–28)
BUN: 12 mg/dL (ref 8–27)
CHLORIDE: 97 mmol/L (ref 96–106)
CO2: 24 mmol/L (ref 18–29)
Calcium: 9.5 mg/dL (ref 8.7–10.3)
Creatinine, Ser: 0.78 mg/dL (ref 0.57–1.00)
GFR calc Af Amer: 87 mL/min/{1.73_m2} (ref >59)
GFR calc non Af Amer: 75 mL/min/{1.73_m2} (ref >59)
GLOBULIN, TOTAL: 2.4 g/dL (ref 1.5–4.5)
Glucose: 95 mg/dL (ref 65–99)
POTASSIUM: 3.9 mmol/L (ref 3.5–5.2)
SODIUM: 138 mmol/L (ref 134–144)
Total Protein: 6.7 g/dL (ref 6.0–8.5)

## 2016-02-01 LAB — URINE CULTURE

## 2016-02-01 LAB — TSH: TSH: 1.76 u[IU]/mL (ref 0.450–4.500)

## 2016-02-01 NOTE — Telephone Encounter (Signed)
LMTCB 02/01/2016  Thanks,   -Mickel Baas

## 2016-02-01 NOTE — Telephone Encounter (Signed)
-----   Message from Margarita Rana, MD sent at 02/01/2016  1:59 PM EDT ----- Does have infection. Sensitive to [place]. Please notify patient and send in rx to pharmacy of choice.   Macrobid 100 mg bid for 7 days. Thanks.

## 2016-02-01 NOTE — Telephone Encounter (Signed)
Pt is returning call.  CB#[phone]/MW

## 2016-02-01 NOTE — Telephone Encounter (Signed)
-----   Message from Margarita Rana, MD sent at 02/01/2016  9:45 AM EDT ----- Labs stable.  Awaiting urine culture. Thanks.

## 2016-02-02 MED ORDER — NITROFURANTOIN MONOHYD MACRO 100 MG PO CAPS: 100.0000 mg | ORAL_CAPSULE | Freq: Two times a day (BID) | ORAL | Status: DC

## 2016-02-15 ENCOUNTER — Encounter: Payer: Self-pay | Admitting: General Surgery

## 2016-02-16 ENCOUNTER — Encounter: Payer: Self-pay | Admitting: General Surgery

## 2016-02-16 ENCOUNTER — Ambulatory Visit (INDEPENDENT_AMBULATORY_CARE_PROVIDER_SITE_OTHER): Payer: Medicare Other | Admitting: General Surgery

## 2016-02-16 VITALS — BP 132/66 | HR 78 | Resp 12 | Ht 62.0 in | Wt 132.0 lb

## 2016-02-16 DIAGNOSIS — Z1211 Encounter for screening for malignant neoplasm of colon: Secondary | ICD-10-CM | POA: Diagnosis not present

## 2016-02-16 MED ORDER — POLYETHYLENE GLYCOL 3350 17 GM/SCOOP PO POWD
ORAL | Status: DC
Start: 2016-02-16 — End: 2016-03-21

## 2016-02-16 NOTE — Patient Instructions (Addendum)
Colonoscopy A colonoscopy is an exam to look at the entire large intestine (colon). This exam can help find problems such as tumors, polyps, inflammation, and areas of bleeding. The exam takes about 1 hour.  LET [facility] CARE PROVIDER KNOW ABOUT:   Any allergies you have.  All medicines you are taking, including vitamins, herbs, eye drops, creams, and over-the-counter medicines.  Previous problems you or members of your family have had with the use of anesthetics.  Any blood disorders you have.  Previous surgeries you have had.  Medical conditions you have. RISKS AND COMPLICATIONS  Generally, this is a safe procedure. However, as with any procedure, complications can occur. Possible complications include:  Bleeding.  Tearing or rupture of the colon wall.  Reaction to medicines given during the exam.  Infection (rare). BEFORE THE PROCEDURE   Ask your health care provider about changing or stopping your regular medicines.  You may be prescribed an oral bowel prep. This involves drinking a large amount of medicated liquid, starting the day before your procedure. The liquid will cause you to have multiple loose stools until your stool is almost clear or light green. This cleans out your colon in preparation for the procedure.  Do not eat or drink anything else once you have started the bowel prep, unless your health care provider tells you it is safe to do so.  Arrange for someone to drive you home after the procedure. PROCEDURE   You will be given medicine to help you relax (sedative).  You will lie on your side with your knees bent.  A long, flexible tube with a light and camera on the end (colonoscope) will be inserted through the rectum and into the colon. The camera sends video back to a computer screen as it moves through the colon. The colonoscope also releases carbon dioxide gas to inflate the colon. This helps your health care provider see the area better.  During  the exam, your health care provider may take a small tissue sample (biopsy) to be examined under a microscope if any abnormalities are found.  The exam is finished when the entire colon has been viewed. AFTER THE PROCEDURE   Do not drive for 24 hours after the exam.  You may have a small amount of blood in your stool.  You may pass moderate amounts of gas and have mild abdominal cramping or bloating. This is caused by the gas used to inflate your colon during the exam.  Ask when your test results will be ready and how you will get your results. Make sure you get your test results.   This information is not intended to replace advice given to you by your health care provider. Make sure you discuss any questions you have with your health care provider.   Document Released: 09/14/2000 Document Revised: 07/08/2013 Document Reviewed: 05/25/2013 Elsevier Interactive Patient Education [employer].  Patient has been scheduled for a colonoscopy on 03-21-16 at [facility].

## 2016-02-16 NOTE — Progress Notes (Signed)
Patient ID: LUN BREIT, female   DOB: Jul 07, 1941, 75 y.o.   MRN: [id number]  Chief Complaint  Patient presents with  . Colonoscopy    HPI Karen Henson is a 75 y.o. female here today for a evaluation of a screening colonoscopy. Patient last colonoscopy was done in 02/15/2006. She states no GI problems at this time. Moves her bowels daily.   The patient has been widowed since her last visit. HPI  Past Medical History  Diagnosis Date  . 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 Hypertension     Past Surgical History  Procedure Laterality Date  . Dilation and curettage of uterus    . Varicose vein surgery    . Breast lumpectomy Left   . Colonoscopy  02/15/2006    Family History  Problem Relation Age of Onset  . Hypertension Mother   . Emphysema Father   . Stroke Sister   . Stroke Brother     Social History Social History  Substance Use Topics  . Smoking status: Never Smoker   . Smokeless tobacco: Never Used  . Alcohol Use: Yes     Comment: ocassional    No Known Allergies  Current Outpatient Prescriptions  Medication Sig Dispense Refill  . lisinopril-hydrochlorothiazide (PRINZIDE,ZESTORETIC) 10-12.5 MG tablet TAKE 1 TABLET BY MOUTH ONCE DAILY. 30 tablet 11  . loratadine (CLARITIN) 10 MG tablet Take by mouth.    . polyethylene glycol powder (GLYCOLAX/MIRALAX) powder 255 grams one bottle for colonoscopy prep 255 g 0   No current facility-administered medications for this visit.    Review of Systems Review of Systems  Constitutional: Negative.   Respiratory: Negative.   Cardiovascular: Negative.     Blood pressure 132/66, pulse 78, resp. rate 12, height 5\' 2"  (1.575 m), weight 132 lb (59.875 kg).  Physical Exam Physical Exam  Constitutional: She is oriented to person, place, and time. She appears well-developed and well-nourished.  Eyes: Conjunctivae are normal. No scleral icterus.  Neck: Neck supple.  Cardiovascular: Normal rate, regular rhythm and normal heart  sounds.   Pulmonary/Chest: Effort normal and breath sounds normal.  Abdominal: Soft. Bowel sounds are normal. There is no tenderness.  Lymphadenopathy:    She has no cervical adenopathy.  Neurological: She is alert and oriented to person, place, and time.  Skin: Skin is warm and dry.    Data Reviewed 01/30/2016 laboratory studies reviewed. CBC and comp and some metabolic panel normal. Urinalysis showed Escherichia coli UTI treated with Macrobid. She shouldn't presently reporting no symptoms.  Assessment    Candidate for screening colonoscopy.    Plan        Colonoscopy with possible biopsy/polypectomy prn: Information regarding the procedure, including its potential risks and complications (including but not limited to perforation of the bowel, which may require emergency surgery to repair, and bleeding) was verbally given to the patient. Educational information regarding lower intestinal endoscopy was given to the patient. Written instructions for how to complete the bowel prep using Miralax were provided. The importance of drinking ample fluids to avoid dehydration as a result of the prep emphasized.  Patient has been scheduled for a colonoscopy on 03-21-16 at [facility].   PCP:  Margarita Rana This information has been scribed by Gaspar Cola CMA.    Karen Henson 02/17/2016, 7:08 AM

## 2016-02-17 DIAGNOSIS — Z1211 Encounter for screening for malignant neoplasm of colon: Secondary | ICD-10-CM | POA: Insufficient documentation

## 2016-02-20 ENCOUNTER — Ambulatory Visit
Admission: RE | Admit: 2016-02-20 | Discharge: 2016-02-20 | Disposition: A | Discharge status: Home / Self Care | Payer: Medicare Other | Source: Ambulatory Visit | Attending: Family Medicine | Admitting: Family Medicine

## 2016-02-20 ENCOUNTER — Other Ambulatory Visit: Payer: Self-pay | Admitting: Family Medicine

## 2016-02-20 DIAGNOSIS — M818 Other osteoporosis without current pathological fracture: Secondary | ICD-10-CM | POA: Insufficient documentation

## 2016-02-20 DIAGNOSIS — Z1231 Encounter for screening mammogram for malignant neoplasm of breast: Secondary | ICD-10-CM

## 2016-02-20 DIAGNOSIS — M810 Age-related osteoporosis without current pathological fracture: Secondary | ICD-10-CM | POA: Diagnosis not present

## 2016-02-20 DIAGNOSIS — Z780 Asymptomatic menopausal state: Secondary | ICD-10-CM | POA: Insufficient documentation

## 2016-03-02 ENCOUNTER — Ambulatory Visit (INDEPENDENT_AMBULATORY_CARE_PROVIDER_SITE_OTHER): Payer: Medicare Other | Admitting: Family Medicine

## 2016-03-02 ENCOUNTER — Encounter: Payer: Self-pay | Admitting: Family Medicine

## 2016-03-02 VITALS — BP 110/70 | HR 60 | Temp 97.6°F | Resp 16 | Ht 62.0 in | Wt 131.0 lb

## 2016-03-02 DIAGNOSIS — M810 Age-related osteoporosis without current pathological fracture: Secondary | ICD-10-CM | POA: Diagnosis not present

## 2016-03-02 MED ORDER — ALENDRONATE SODIUM 70 MG PO TABS: 70.0000 mg | ORAL_TABLET | ORAL | Status: DC

## 2016-03-02 NOTE — Progress Notes (Signed)
Patient ID: Karen Henson, female   DOB: 24-Aug-1941, 75 y.o.   MRN: [id number]       Patient: Karen Henson Female    DOB: 1940-11-06   75 y.o.   MRN: [id number] Visit Date: 03/02/2016  Today's Provider: Margarita Rana, MD   Chief Complaint  Patient presents with  . Osteoporosis   Subjective:    HPI Patient here to follow up on dexa scan result. BMD was done 02/20/2016.  Osteoporosis: Patient complains of osteoporosis. She was diagnosed with osteoporosis by bone density scan in 02/20/2016. Patient denies history of fracture.The cause of osteoporosis is felt to be due to thin, post-menopausal.    She is not currently being treated with calcium and vitamin D supplementation.  She is not currently being treated with bisphosphonates  Osteoporosis Risk Factors  Nonmodifiable Personal Hx of fracture as an adult: no Hx of fracture in first-degree relative: yes - mother Caucasian race: yes Advanced age: yes Female sex: yes Dementia: no Poor health/frailty: no  Potentially modifiable: Tobacco use: no Low body weight (<127 lbs): no Estrogen deficiency  early menopause (age <45) or bilateral ovariectomy: yes  prolonged premenopausal amenorrhea (>1 yr): no Low calcium intake (lifelong): no Alcoholism: no Recurrent falls: no Inadequate physical activity: no  Current calcium and Vit D intake: Dietary sources: well balanced diet. Supplements: none    No Known Allergies Previous Medications   LISINOPRIL-HYDROCHLOROTHIAZIDE (PRINZIDE,ZESTORETIC) 10-12.5 MG TABLET    TAKE 1 TABLET BY MOUTH ONCE DAILY.   LORATADINE (CLARITIN) 10 MG TABLET    Take by mouth.   POLYETHYLENE GLYCOL POWDER (GLYCOLAX/MIRALAX) POWDER    255 grams one bottle for colonoscopy prep    Review of Systems  Constitutional: Negative.   Cardiovascular: Negative.   Musculoskeletal: Negative.     Social History  Substance Use Topics  . Smoking status: Never Smoker   . Smokeless tobacco: Never Used  .  Alcohol Use: Yes     Comment: ocassional   Objective:   BP 110/70 mmHg  Pulse 60  Temp(Src) 97.6 F (36.4 C) (Oral)  Resp 16  Ht 5\' 2"  (1.575 m)  Wt 131 lb (59.421 kg)  BMI 23.95 kg/m2  Physical Exam  Constitutional: She is oriented to person, place, and time. She appears well-developed and well-nourished.  Neurological: She is alert and oriented to person, place, and time.  Psychiatric: She has a normal mood and affect. Her behavior is normal. Judgment and thought content normal.      Assessment & Plan:     1. OP (osteoporosis) Recurrent problem. Patient started on alendronate 70 mg as below. Discussed risks and benefits of the medication.   Has been on previously years ago and tolerated it years ago.   - alendronate (FOSAMAX) 70 MG tablet; Take 1 tablet (70 mg total) by mouth every 7 (seven) days. Take with a full glass of water on an empty stomach.  Dispense: 4 tablet; Refill: 11    Patient seen and examined by Dr. Jerrell Belfast, and note scribed by Philbert Riser. Dimas, CMA.  I have reviewed the document for accuracy and completeness and I agree with above. - Jerrell Belfast, MD   Margarita Rana, MD  [facility]

## 2016-03-14 ENCOUNTER — Telehealth: Payer: Self-pay | Admitting: *Deleted

## 2016-03-14 NOTE — Telephone Encounter (Signed)
Patient contacted today and medication list was reviewed with the patient. This patient states she has not started on the Fosamax yet but Dr. Venia Minks did prescribe to her. All other medications are the same.  Also, patient reports she has picked Miralax prescription.   We will proceed with colonoscopy as scheduled for 03-21-16 at [facility].   Patient instructed to call the office should she have further questions.

## 2016-03-21 ENCOUNTER — Encounter: Payer: Self-pay | Admitting: *Deleted

## 2016-03-21 ENCOUNTER — Ambulatory Visit
Admission: RE | Admit: 2016-03-21 | Discharge: 2016-03-21 | Disposition: A | Discharge status: Home / Self Care | Payer: Medicare Other | Source: Ambulatory Visit | Attending: General Surgery | Admitting: General Surgery

## 2016-03-21 ENCOUNTER — Encounter
Admission: RE | Disposition: A | Discharge status: Home / Self Care | Payer: Self-pay | Source: Ambulatory Visit | Attending: General Surgery

## 2016-03-21 ENCOUNTER — Ambulatory Visit: Payer: Medicare Other | Admitting: Anesthesiology

## 2016-03-21 DIAGNOSIS — J8410 Pulmonary fibrosis, unspecified: Secondary | ICD-10-CM | POA: Diagnosis not present

## 2016-03-21 DIAGNOSIS — D123 Benign neoplasm of transverse colon: Secondary | ICD-10-CM | POA: Insufficient documentation

## 2016-03-21 DIAGNOSIS — K635 Polyp of colon: Secondary | ICD-10-CM | POA: Diagnosis not present

## 2016-03-21 DIAGNOSIS — Z79899 Other long term (current) drug therapy: Secondary | ICD-10-CM | POA: Insufficient documentation

## 2016-03-21 DIAGNOSIS — Z1211 Encounter for screening for malignant neoplasm of colon: Secondary | ICD-10-CM | POA: Insufficient documentation

## 2016-03-21 DIAGNOSIS — Z9889 Other specified postprocedural states: Secondary | ICD-10-CM | POA: Diagnosis not present

## 2016-03-21 DIAGNOSIS — K5730 Diverticulosis of large intestine without perforation or abscess without bleeding: Secondary | ICD-10-CM | POA: Diagnosis not present

## 2016-03-21 DIAGNOSIS — I10 Essential (primary) hypertension: Secondary | ICD-10-CM | POA: Diagnosis not present

## 2016-03-21 DIAGNOSIS — K5790 Diverticulosis of intestine, part unspecified, without perforation or abscess without bleeding: Secondary | ICD-10-CM | POA: Diagnosis not present

## 2016-03-21 HISTORY — PX: COLONOSCOPY WITH PROPOFOL: SHX5780

## 2016-03-21 SURGERY — COLONOSCOPY WITH PROPOFOL
Anesthesia: General

## 2016-03-21 MED ORDER — PROPOFOL 10 MG/ML IV BOLUS
INTRAVENOUS | Status: DC | PRN
  Administered 2016-03-21: 60 mg via INTRAVENOUS

## 2016-03-21 MED ORDER — PROPOFOL 500 MG/50ML IV EMUL
INTRAVENOUS | Status: DC | PRN
  Administered 2016-03-21: 150 ug/kg/min via INTRAVENOUS

## 2016-03-21 MED ORDER — SODIUM CHLORIDE 0.9 % IV SOLN
INTRAVENOUS | Status: DC
  Administered 2016-03-21: 1000 mL via INTRAVENOUS

## 2016-03-21 MED ORDER — EPHEDRINE SULFATE 50 MG/ML IJ SOLN
INTRAMUSCULAR | Status: DC | PRN
  Administered 2016-03-21: 10 mg via INTRAVENOUS

## 2016-03-21 MED ORDER — LIDOCAINE HCL (CARDIAC) 20 MG/ML IV SOLN
INTRAVENOUS | Status: DC | PRN
  Administered 2016-03-21: 30 mg via INTRAVENOUS

## 2016-03-21 NOTE — Transfer of Care (Signed)
Immediate Anesthesia Transfer of Care Note  Patient: Karen Henson  Procedure(s) Performed: Procedure(s): COLONOSCOPY WITH PROPOFOL (N/A)  Patient Location: Endoscopy Unit  Anesthesia Type:General  Level of Consciousness: sedated  Airway & Oxygen Therapy: Patient Spontanous Breathing and Patient connected to nasal cannula oxygen  Post-op Assessment: Report given to RN and Post -op Vital signs reviewed and stable  Post vital signs: Reviewed and stable  Last Vitals:  Filed Vitals:   03/21/16 1005 03/21/16 1107  BP: 123/93 81/55  Pulse: 70 86  Temp: 36.1 C 35.8 C  Resp: 16 15    Last Pain: There were no vitals filed for this visit.       Complications: No apparent anesthesia complications

## 2016-03-21 NOTE — Anesthesia Preprocedure Evaluation (Signed)
Anesthesia Evaluation  Patient identified by MRN, date of birth, ID band Patient awake    Reviewed: Allergy & Precautions, H&P , NPO status , Patient's Chart, lab work & pertinent test results, reviewed documented beta blocker date and time   History of Anesthesia Complications Negative for: history of anesthetic complications  Airway Mallampati: III  TM Distance: >3 FB Neck ROM: full    Dental no notable dental hx. (+) Teeth Intact Permanent bridge:   Pulmonary neg shortness of breath, neg sleep apnea, neg COPD, neg recent URI,  Pulmonary fibrosis   Pulmonary exam normal breath sounds clear to auscultation       Cardiovascular Exercise Tolerance: Good hypertension, (-) angina(-) CAD, (-) Past MI, (-) Cardiac Stents and (-) CABG Normal cardiovascular exam(-) dysrhythmias (-) Valvular Problems/Murmurs Rhythm:regular Rate:Normal     Neuro/Psych negative neurological ROS  negative psych ROS   GI/Hepatic negative GI ROS, Neg liver ROS,   Endo/Other  negative endocrine ROS  Renal/GU negative Renal ROS  negative genitourinary   Musculoskeletal   Abdominal   Peds  Hematology negative hematology ROS (+)   Anesthesia Other Findings Past Medical History:   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[facility])                                          Hypertension                                                 Reproductive/Obstetrics negative OB ROS                             Anesthesia Physical Anesthesia Plan  ASA: II  Anesthesia Plan: General   Post-op Pain Management:    Induction:   Airway Management Planned:   Additional Equipment:   Intra-op Plan:   Post-operative Plan:   Informed Consent: I have reviewed the patients History and Physical, chart, labs and discussed the procedure including the risks, benefits and alternatives for the proposed anesthesia with the patient or authorized representative who has  indicated his/her understanding and acceptance.   Dental Advisory Given  Plan Discussed with: Anesthesiologist, CRNA and Surgeon  Anesthesia Plan Comments:         Anesthesia Quick Evaluation

## 2016-03-21 NOTE — Op Note (Addendum)
[facility] Gastroenterology Patient Name: Karen Henson Procedure Date: 03/21/2016 10:29 AM MRN: [id number] Account #: [id number] Date of Birth: 09/18/1941 Admit Type: Outpatient Age: 75 Room: [facility] ENDO ROOM 1 Gender: Female Note Status: Finalized Procedure:            Colonoscopy Indications:          Screening for colorectal malignant neoplasm Providers:            Robert Bellow, MD Referring MD:         Jerrell Belfast, MD (Referring MD) Medicines:            Monitored Anesthesia Care Complications:        No immediate complications. Procedure:            Pre-Anesthesia Assessment:                       - Prior to the procedure, a History and Physical was                        performed, and patient medications, allergies and                        sensitivities were reviewed. The patient's tolerance of                        previous anesthesia was reviewed.                       - The risks and benefits of the procedure and the                        sedation options and risks were discussed with the                        patient. All questions were answered and informed                        consent was obtained.                       After obtaining informed consent, the colonoscope was                        passed under direct vision. Throughout the procedure,                        the patient's blood pressure, pulse, and oxygen                        saturations were monitored continuously. The                        Colonoscope was introduced through the anus and                        advanced to the the cecum, identified by appendiceal                        orifice and ileocecal valve. The colonoscopy was  performed without difficulty. The patient tolerated the                        procedure well. The quality of the bowel preparation                        was excellent. Findings:      Many medium-mouthed  diverticula were found in the sigmoid colon.      A 6 mm polyp was found in the transverse colon. The polyp was sessile.       Biopsies were taken with a cold forceps for histology.      The retroflexed view of the distal rectum and anal verge was normal and       showed no anal or rectal abnormalities. Impression:           - Diverticulosis in the sigmoid colon.                       - One 6 mm polyp in the transverse colon. Biopsied.                       - The distal rectum and anal verge are normal on                        retroflexion view. Recommendation:       - Telephone endoscopist for pathology results in 1 week. Procedure Code(s):    --- Professional ---                       [id number], Colonoscopy, flexible; with biopsy, single or                        multiple Diagnosis Code(s):    --- Professional ---                       Z12.11, Encounter for screening for malignant neoplasm                        of colon                       D12.3, Benign neoplasm of transverse colon (hepatic                        flexure or splenic flexure)                       K57.30, Diverticulosis of large intestine without                        perforation or abscess without bleeding CPT copyright 2016 American Medical Association. All rights reserved. The codes documented in this report are preliminary and upon coder review may  be revised to meet current compliance requirements. Robert Bellow, MD 03/21/2016 11:07:41 AM This report has been signed electronically. Number of Addenda: 0 Note Initiated On: 03/21/2016 10:29 AM Scope Withdrawal Time: 0 hours 18 minutes 22 seconds  Total Procedure Duration: 0 hours 28 minutes 19 seconds       [facility] [place]

## 2016-03-21 NOTE — H&P (Signed)
Karen Henson [id number] [birth year]     HPI: 76 y/o woman for screening colonoscopy. Reports feeling well. Tolerated prep without difficulty.   Prescriptions prior to admission  Medication Sig Dispense Refill Last Dose  . lisinopril-hydrochlorothiazide (PRINZIDE,ZESTORETIC) 10-12.5 MG tablet TAKE 1 TABLET BY MOUTH ONCE DAILY. 30 tablet 11 03/21/2016 at 0600  . alendronate (FOSAMAX) 70 MG tablet Take 1 tablet (70 mg total) by mouth every 7 (seven) days. Take with a full glass of water on an empty stomach. 4 tablet 11   . loratadine (CLARITIN) 10 MG tablet Take by mouth.   Taking  . polyethylene glycol powder (GLYCOLAX/MIRALAX) powder 255 grams one bottle for colonoscopy prep 255 g 0 Taking   No Known Allergies Past Medical History  Diagnosis Date  . 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 Hypertension    Past Surgical History  Procedure Laterality Date  . Dilation and curettage of uterus    . Varicose vein surgery    . Breast lumpectomy Left   . Colonoscopy  02/15/2006  . Breast biopsy Left 1996    neg   Social History   Social History  . Marital Status: Widowed    Spouse Name: N/A  . Number of Children: N/A  . Years of Education: N/A   Occupational History  . Not on file.   Social History Main Topics  . Smoking status: Never Smoker   . Smokeless tobacco: Never Used  . Alcohol Use: Yes     Comment: ocassional  . Drug Use: No  . Sexual Activity: Not on file   Other Topics Concern  . Not on file   Social History Narrative   Social History   Social History Narrative     ROS: Negative.     PE: HEENT: Negative. Lungs: Clear. Cardio: RR.  Assessment/Plan:  Proceed with planned endoscopy.  Robert Bellow 03/21/2016

## 2016-03-22 ENCOUNTER — Encounter: Payer: Self-pay | Admitting: General Surgery

## 2016-03-22 NOTE — Anesthesia Postprocedure Evaluation (Signed)
Anesthesia Post Note  Patient: Karen Henson  Procedure(s) Performed: Procedure(s) (LRB): COLONOSCOPY WITH PROPOFOL (N/A)  Patient location during evaluation: PACU Anesthesia Type: General Level of consciousness: awake and alert Pain management: pain level controlled Vital Signs Assessment: post-procedure vital signs reviewed and stable Respiratory status: spontaneous breathing, nonlabored ventilation, respiratory function stable and patient connected to nasal cannula oxygen Cardiovascular status: blood pressure returned to baseline and stable Postop Assessment: no signs of nausea or vomiting Anesthetic complications: no    Last Vitals:  Filed Vitals:   03/21/16 1120 03/21/16 1130  BP: 94/65 117/74  Pulse: 73 66  Temp:    Resp: 16 15    Last Pain:  Filed Vitals:   03/22/16 0815  PainSc: 0-No pain                 Martha Clan

## 2016-04-24 LAB — SURGICAL PATHOLOGY

## 2016-04-25 DIAGNOSIS — J309 Allergic rhinitis, unspecified: Secondary | ICD-10-CM | POA: Diagnosis not present

## 2016-04-25 DIAGNOSIS — J8410 Pulmonary fibrosis, unspecified: Secondary | ICD-10-CM | POA: Diagnosis not present

## 2016-05-02 DIAGNOSIS — R0602 Shortness of breath: Secondary | ICD-10-CM | POA: Diagnosis not present

## 2016-05-30 ENCOUNTER — Encounter: Payer: Self-pay | Admitting: Family Medicine

## 2016-05-30 DIAGNOSIS — Z86010 Personal history of colonic polyps: Secondary | ICD-10-CM | POA: Insufficient documentation

## 2016-06-20 DIAGNOSIS — H35372 Puckering of macula, left eye: Secondary | ICD-10-CM | POA: Diagnosis not present

## 2016-07-31 DIAGNOSIS — Z23 Encounter for immunization: Secondary | ICD-10-CM | POA: Diagnosis not present

## 2016-09-26 ENCOUNTER — Other Ambulatory Visit: Payer: Self-pay | Admitting: *Deleted

## 2016-09-26 MED ORDER — LISINOPRIL-HYDROCHLOROTHIAZIDE 10-12.5 MG PO TABS: 1.0000 | ORAL_TABLET | Freq: Every day | ORAL | 6 refills | Status: DC

## 2016-10-30 ENCOUNTER — Ambulatory Visit (INDEPENDENT_AMBULATORY_CARE_PROVIDER_SITE_OTHER): Payer: Medicare Other | Admitting: Physician Assistant

## 2016-10-30 ENCOUNTER — Encounter: Payer: Self-pay | Admitting: Physician Assistant

## 2016-10-30 VITALS — BP 110/70 | HR 67 | Resp 16 | Wt 132.0 lb

## 2016-10-30 DIAGNOSIS — J0110 Acute frontal sinusitis, unspecified: Secondary | ICD-10-CM

## 2016-10-30 DIAGNOSIS — N766 Ulceration of vulva: Secondary | ICD-10-CM

## 2016-10-30 MED ORDER — AMOXICILLIN-POT CLAVULANATE 875-125 MG PO TABS: 1.0000 | ORAL_TABLET | Freq: Two times a day (BID) | ORAL | 0 refills | Status: AC

## 2016-10-30 NOTE — Progress Notes (Signed)
Patient: Karen Henson Female    DOB: 03-Sep-1941   76 y.o.   MRN: [id number] Visit Date: 10/30/2016  Today's Provider: Trinna Post, PA-C   Chief Complaint  Patient presents with  . genital sore  . URI   Subjective:    HPI Patient is here today with c/o of a lesion in genital area for six months now. She reports that it is uncomfortable with her clothes and sitting. It itches and burns, worse with urination. No history of STIs, HPV. Not sexually active currently, was with husband who passed away three years ago. No new lotions or creams. No fever, chills, swollen lymph nodes. No drainage or fever. No vaginal discharge or bleeding. Treatments tried OTC ointment and Vaseline.  Upper Respiratory Infection: Patient complains of symptoms of a URI. Symptoms include congestion and cough. Onset of symptoms was last week, unchanged since that time. She also c/o nasal congestion, no  fever and post nasal drip, sneezing,postnasal drip, sinus pressure for the past 1 week . No ear pain,SOB, chest tightness, or wheezing, sore throat, vomiting or diarrhea. 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       No Known Allergies   Current Outpatient Prescriptions:  .  lisinopril-hydrochlorothiazide (PRINZIDE,ZESTORETIC) 10-12.5 MG tablet, Take 1 tablet by mouth daily., Disp: 30 tablet, Rfl: 6 .  loratadine (CLARITIN) 10 MG tablet, Take by mouth., Disp: , Rfl:  .  alendronate (FOSAMAX) 70 MG tablet, Take 1 tablet (70 mg total) by mouth every 7 (seven) days. Take with a full glass of water on an empty stomach. (Patient not taking: Reported on 10/30/2016), Disp: 4 tablet, Rfl: 11  Review of Systems  HENT: Positive for congestion, postnasal drip, rhinorrhea, sinus pain, sinus pressure and sneezing. Negative for ear pain and sore throat.   Respiratory: Positive for cough. Negative for chest tightness, shortness of breath and wheezing.   Cardiovascular: Negative for chest pain,  palpitations and leg swelling.  Gastrointestinal: Negative for diarrhea and vomiting.  Genitourinary: Positive for genital sores.  Neurological: Negative for headaches.    Social History  Substance Use Topics  . Smoking status: Never Smoker  . Smokeless tobacco: Never Used  . Alcohol use Yes     Comment: ocassional   Objective:   BP 110/70 (BP Location: Left Arm, Patient Position: Sitting, Cuff Size: Normal)   Pulse 67   Resp 16   Wt 132 lb (59.9 kg)   BMI 24.14 kg/m   Physical Exam  Constitutional: She is oriented to person, place, and time. She appears well-developed and well-nourished. No distress.  HENT:  Right Ear: External ear normal.  Left Ear: External ear normal.  Nose: Right sinus exhibits maxillary sinus tenderness and frontal sinus tenderness. Left sinus exhibits maxillary sinus tenderness and frontal sinus tenderness.  Mouth/Throat: Oropharynx is clear and moist. No oropharyngeal exudate, posterior oropharyngeal edema or posterior oropharyngeal erythema.  Tms opaque bilaterally   Eyes: Conjunctivae are normal. Right eye exhibits no discharge. Left eye exhibits no discharge.  Neck: Neck supple.  Cardiovascular: Normal rate and regular rhythm.   Pulmonary/Chest: Effort normal and breath sounds normal.  Genitourinary:    No labial fusion. There is rash, tenderness and lesion on the right labia. There is no injury on the right labia. There is rash, tenderness and lesion on the left labia. There is no injury on the left labia. No erythema, tenderness or bleeding in the vagina. No foreign body in the vagina.  No signs of injury around the vagina. No vaginal discharge found.  Lymphadenopathy:    She has cervical adenopathy.       Right: No inguinal adenopathy present.       Left: No inguinal adenopathy present.  Neurological: She is alert and oriented to person, place, and time.  Skin: Skin is warm and dry. She is not diaphoretic.  Psychiatric: She has a normal mood  and affect. Her behavior is normal.        Assessment & Plan:     1. Ulcer of genital labia  Present constantly for six months. Does not appear vesicular, appears more irritative but 2/2 length of symptoms will culture as below. Not single unified lesion so did not test for syphilis. If negative, will refer to gynecology.  - Herpes simplex virus culture  2. Acute non-recurrent frontal sinusitis  Treat as below.  - amoxicillin-clavulanate (AUGMENTIN) 875-125 MG tablet; Take 1 tablet by mouth 2 (two) times daily.  Dispense: 20 tablet; Refill: 0  Return if symptoms worsen or fail to improve.   Patient Instructions  Sinusitis, Adult Sinusitis is soreness and inflammation of your sinuses. Sinuses are hollow spaces in the bones around your face. They are located:  Around your eyes.  In the middle of your forehead.  Behind your nose.  In your cheekbones. Your sinuses and nasal passages are lined with a stringy fluid (mucus). Mucus normally drains out of your sinuses. When your nasal tissues get inflamed or swollen, the mucus can get trapped or blocked so air cannot flow through your sinuses. This lets bacteria, viruses, and funguses grow, and that leads to infection. Follow these instructions at home: Medicines  Take, use, or apply over-the-counter and prescription medicines only as told by your doctor. These may include nasal sprays.  If you were prescribed an antibiotic medicine, take it as told by your doctor. Do not stop taking the antibiotic even if you start to feel better. Hydrate and Humidify  Drink enough water to keep your pee (urine) clear or pale yellow.  Use a cool mist humidifier to keep the humidity level in your home above 50%.  Breathe in steam for 10-15 minutes, 3-4 times a day or as told by your doctor. You can do this in the bathroom while a hot shower is running.  Try not to spend time in cool or dry air. Rest  Rest as much as possible.  Sleep with  your head raised (elevated).  Make sure to get enough sleep each night. General instructions  Put a warm, moist washcloth on your face 3-4 times a day or as told by your doctor. This will help with discomfort.  Wash your hands often with soap and water. If there is no soap and water, use hand sanitizer.  Do not smoke. Avoid being around people who are smoking (secondhand smoke).  Keep all follow-up visits as told by your doctor. This is important. Contact a doctor if:  You have a fever.  Your symptoms get worse.  Your symptoms do not get better within 10 days. Get help right away if:  You have a very bad headache.  You cannot stop throwing up (vomiting).  You have pain or swelling around your face or eyes.  You have trouble seeing.  You feel confused.  Your neck is stiff.  You have trouble breathing. This information is not intended to replace advice given to you by your health care provider. Make sure you discuss any questions  you have with your health care provider. Document Released: 03/05/2008 Document Revised: 05/13/2016 Document Reviewed: 07/13/2015 Elsevier Interactive Patient Education  2017 [employer].   The entirety of the information documented in the History of Present Illness, Review of Systems and Physical Exam were personally obtained by me. Portions of this information were initially documented by Lakeeta Dobosz and reviewed by me for thoroughness and accuracy.            Trinna Post, PA-C  [facility]

## 2016-10-30 NOTE — Patient Instructions (Signed)

## 2016-11-02 ENCOUNTER — Telehealth: Payer: Self-pay

## 2016-11-02 DIAGNOSIS — N898 Other specified noninflammatory disorders of vagina: Secondary | ICD-10-CM

## 2016-11-02 LAB — HERPES SIMPLEX VIRUS CULTURE

## 2016-11-02 NOTE — Telephone Encounter (Signed)
-----   Message from Trinna Post, [place] sent at 11/02/2016  8:29 AM EST ----- Herpes Simplex culture was negative, would like patient to see gynecology for further evaluation 2/2 length of symptoms. Does she prefer anybody specific or is the [facility] system okay>

## 2016-11-02 NOTE — Telephone Encounter (Signed)
LMTCB 11/02/2016  Thanks,   -Mickel Baas

## 2016-11-02 NOTE — Telephone Encounter (Signed)
Have sent in gynecology referral within [place] system. She should hear from Parke Poisson about this in about one week.

## 2016-11-02 NOTE — Telephone Encounter (Signed)
Pt advised.  She was okay with who you recommend.   Thanks,   -Mickel Baas

## 2016-11-06 DIAGNOSIS — R0602 Shortness of breath: Secondary | ICD-10-CM | POA: Diagnosis not present

## 2016-11-06 DIAGNOSIS — R05 Cough: Secondary | ICD-10-CM | POA: Diagnosis not present

## 2016-11-06 DIAGNOSIS — J0191 Acute recurrent sinusitis, unspecified: Secondary | ICD-10-CM | POA: Diagnosis not present

## 2016-12-04 ENCOUNTER — Ambulatory Visit (INDEPENDENT_AMBULATORY_CARE_PROVIDER_SITE_OTHER): Payer: Medicare Other | Admitting: Obstetrics and Gynecology

## 2016-12-04 ENCOUNTER — Encounter: Payer: Self-pay | Admitting: Obstetrics and Gynecology

## 2016-12-04 VITALS — BP 125/82 | HR 83 | Ht 62.0 in | Wt 128.0 lb

## 2016-12-04 DIAGNOSIS — L292 Pruritus vulvae: Secondary | ICD-10-CM | POA: Diagnosis not present

## 2016-12-04 DIAGNOSIS — N905 Atrophy of vulva: Secondary | ICD-10-CM | POA: Diagnosis not present

## 2016-12-04 DIAGNOSIS — N763 Subacute and chronic vulvitis: Secondary | ICD-10-CM | POA: Diagnosis not present

## 2016-12-04 NOTE — Patient Instructions (Addendum)
1. Vulvar biopsy is taken today 2. Return in 1 week for follow-up on results  VULVAR BIOPSY POST-PROCEDURE INSTRUCTIONS  1. You may take Ibuprofen, Aleve or Tylenol for pain if needed.    2. You may have a small amount of spotting.  You should wear a mini pad for the next few days.  3. You may use some topical Neosporin ointment if you would like (over the counter is fine).  4. You need to call if you have redness around the biopsy site, if there is any unusual draining, if the bleeding is heavy, or if you are concerned.  5. Shower or bathe as normal. May use hair dryer to blow dry perineum after showering  We will discuss the results at your follow-up appointment if needed.

## 2016-12-04 NOTE — Progress Notes (Signed)
GYN ENCOUNTER NOTE  Subjective:       Karen Henson is a 76 y.o. G53P2002 female is here for gynecologic evaluation of the following issues:  1. Vulvar lesion  Patient presents in referral from [facility] family practice for evaluation of vulvar lesion with itching. Symptoms began approximately 6 months ago. Patient has used over-the-counter antibiotic ointment as well as Vaseline without relief of symptoms. She feels that it is intermittently red and burning in nature. Bowel and bladder function are normal.  Patient is postmenopausal; no history of HRT therapy. No history of abnormal Pap smears. Patient is not sexually active; husband of 67 years is deceased 3 years..     Gynecologic History No LMP recorded. Patient is postmenopausal. Contraception: abstinence and post menopausal status   Obstetric History OB History  Gravida Para Term Preterm AB Living  2 2 2     2   SAB TAB Ectopic Multiple Live Births          2    # Outcome Date GA Lbr Len/2nd Weight Sex Delivery Anes PTL Lv  2 Term 1961   7 lb 4 oz (3.289 kg) F Vag-Spont   LIV  1 Term 1959   7 lb 8 oz (3.402 kg) F Vag-Spont   LIV      Past Medical History:  Diagnosis Date  . Hypertension   . Lung fibrosis [facility])     Past Surgical History:  Procedure Laterality Date  . BREAST BIOPSY Left 1996   neg  . BREAST LUMPECTOMY Left   . COLONOSCOPY  02/15/2006  . COLONOSCOPY WITH PROPOFOL N/A 03/21/2016   Procedure: COLONOSCOPY WITH PROPOFOL;  Surgeon: Robert Bellow, MD;  Location: [facility] ENDOSCOPY;  Service: Endoscopy;  Laterality: N/A;  . DILATION AND CURETTAGE OF UTERUS    . VARICOSE VEIN SURGERY      Current Outpatient Prescriptions on File Prior to Visit  Medication Sig Dispense Refill  . lisinopril-hydrochlorothiazide (PRINZIDE,ZESTORETIC) 10-12.5 MG tablet Take 1 tablet by mouth daily. 30 tablet 6  . loratadine (CLARITIN) 10 MG tablet Take by mouth.     No current facility-administered medications on file prior  to visit.     No Known Allergies  Social History   Social History  . Marital status: Widowed    Spouse name: N/A  . Number of children: N/A  . Years of education: N/A   Occupational History  . Not on file.   Social History Main Topics  . Smoking status: Never Smoker  . Smokeless tobacco: Never Used  . Alcohol use Yes     Comment: ocassional  . Drug use: No  . Sexual activity: No   Other Topics Concern  . Not on file   Social History Narrative  . No narrative on file    Family History  Problem Relation Age of Onset  . Hypertension Mother   . Emphysema Father   . Stroke Sister   . Stroke Brother   . Diabetes Maternal Aunt   . Diabetes Maternal Grandmother   . Breast cancer Neg Hx   . Ovarian cancer Neg Hx   . Colon cancer Neg Hx     The following portions of the patient's history were reviewed and updated as appropriate: allergies, current medications, past family history, past medical history, past social history, past surgical history and problem list.  Review of Systems Review of Systems - Per history of present illness  Objective:   BP 125/82   Pulse 83  Ht 5\' 2"  (1.575 m)   Wt 128 lb (58.1 kg)   BMI 23.41 kg/m  CONSTITUTIONAL: Well-developed, well-nourished female in no acute distress.  HENT:  Normocephalic, atraumatic.  NECK: Not examined SKIN: Skin is warm and dry. No rash noted. Not diaphoretic. No erythema. No pallor. [place]: Alert and oriented to person, place, and time. PSYCHIATRIC: Normal mood and affect. Normal behavior. Normal judgment and thought content. CARDIOVASCULAR:Not Examined RESPIRATORY: Not Examined BREASTS: Not Examined ABDOMEN: Soft, non distended; Non tender.  No Organomegaly. 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 No tenderness.  No cyanosis, clubbing, or edema.  PROCEDURE: Vulvar biopsy Verbal consent is obtained. The left labia majora biopsy site is cleansed with Betadine. 2 cc of 1% lidocaine without epinephrine is injected locally for anesthesia. 3.5 mm punch biopsy is taken; specimen sent to pathology. Figure-of-eight 3-0 Vicryl repeat suture is used for hemostasis area and blood loss was minimal. Complications are none. Procedure was well-tolerated.   Assessment:   1. Vulvar itching  2. Vulvar atrophy     Plan:   1. Vulvar biopsy-3.5 mm punch 2. Local wound care instructions given 3. Return in 1 week for follow-up and further management planning  Brayton Mars, MD  Note: This dictation was prepared with Dragon dictation along with smaller phrase technology. Any transcriptional errors that result from this process are unintentional.

## 2016-12-07 LAB — PATHOLOGY

## 2016-12-12 ENCOUNTER — Encounter: Payer: Self-pay | Admitting: Obstetrics and Gynecology

## 2016-12-12 ENCOUNTER — Ambulatory Visit (INDEPENDENT_AMBULATORY_CARE_PROVIDER_SITE_OTHER): Payer: Medicare Other | Admitting: Obstetrics and Gynecology

## 2016-12-12 VITALS — BP 122/76 | HR 77 | Ht 62.0 in | Wt 128.8 lb

## 2016-12-12 DIAGNOSIS — N905 Atrophy of vulva: Secondary | ICD-10-CM

## 2016-12-12 DIAGNOSIS — L292 Pruritus vulvae: Secondary | ICD-10-CM | POA: Diagnosis not present

## 2016-12-12 DIAGNOSIS — N763 Subacute and chronic vulvitis: Secondary | ICD-10-CM | POA: Diagnosis not present

## 2016-12-12 MED ORDER — CLOBETASOL PROPIONATE 0.05 % EX OINT: 1.0000 "application " | TOPICAL_OINTMENT | Freq: Two times a day (BID) | CUTANEOUS | 1 refills | Status: DC

## 2016-12-12 NOTE — Progress Notes (Signed)
GYN ENCOUNTER NOTE  Subjective:       Karen Henson is a 76 y.o. G60P2002 female is here for gynecologic evaluation of the following issues:  1. Follow-up s/p left labial biopsy on 12/04/16.  2. Vulvar pruritus.  Patient presents with continued vulvar itching and to review results of left labial punch biopsy performed on 12/04/16. Patient continues to use Vaseline without relief of symptoms. Patient experiences a "raw and burning" sensation in biopsy site. Patient has not needed analgesics for pain relief. Denies internal pain and symptoms of bladder/bowel dysfunction.  Patient is postmenopausal; no history of HRT or abnormal pap smears. Patient is not sexually active; widow x3 years.   Gynecologic History No LMP recorded. Patient is postmenopausal. Contraception: abstinence; postmenopausal.   Obstetric History OB History  Gravida Para Term Preterm AB Living  2 2 2     2   SAB TAB Ectopic Multiple Live Births          2    # Outcome Date GA Lbr Len/2nd Weight Sex Delivery Anes PTL Lv  2 Term 1961   7 lb 4 oz (3.289 kg) F Vag-Spont   LIV  1 Term 1959   7 lb 8 oz (3.402 kg) F Vag-Spont   LIV      Past Medical History:  Diagnosis Date  . Hypertension   . 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 BREAST BIOPSY Left 1996   neg  . BREAST LUMPECTOMY Left   . COLONOSCOPY  02/15/2006  . COLONOSCOPY WITH PROPOFOL N/A 03/21/2016   Procedure: COLONOSCOPY WITH PROPOFOL;  Surgeon: Robert Bellow, MD;  Location: [facility] ENDOSCOPY;  Service: Endoscopy;  Laterality: N/A;  . DILATION AND CURETTAGE OF UTERUS    . VARICOSE VEIN SURGERY     ALSO ON AUGMENTIN for dental abscess.  Current Outpatient Prescriptions on File Prior to Visit  Medication Sig Dispense Refill  . lisinopril-hydrochlorothiazide (PRINZIDE,ZESTORETIC) 10-12.5 MG tablet Take 1 tablet by mouth daily. 30 tablet 6  . loratadine (CLARITIN) 10 MG tablet Take by mouth.     No current  facility-administered medications on file prior to visit.     No Known Allergies  Social History   Social History  . Marital status: Widowed    Spouse name: N/A  . Number of children: N/A  . Years of education: N/A   Occupational History  . Not on file.   Social History Main Topics  . Smoking status: Never Smoker  . Smokeless tobacco: Never Used  . Alcohol use Yes     Comment: ocassional  . Drug use: No  . Sexual activity: No   Other Topics Concern  . Not on file   Social History Narrative  . No narrative on file    Family History  Problem Relation Age of Onset  . Hypertension Mother   . Emphysema Father   . Stroke Sister   . Stroke Brother   . Diabetes Maternal Aunt   . Diabetes Maternal Grandmother   . Breast cancer Neg Hx   . Ovarian cancer Neg Hx   . Colon cancer Neg Hx     The following portions of the patient's history were reviewed and updated as appropriate: allergies, current medications, past family history, past medical history, past social history, past surgical history and problem list.  Review of Systems Review of Systems - General ROS: Positive for occasional hot flashes; Negative for - chills, fatigue, fever,  malaise or night sweats Hematological and Lymphatic ROS: Negative for - bleeding problems Pulmonary: Positive for chronic, occasional nonproductive cough; Negative for- Shortness of breath, chest pain Gastrointestinal ROS: negative for - abdominal pain, blood in stools, change in bowel habits and nausea/vomiting Musculoskeletal ROS: negative for - joint pain, muscle pain or muscular weakness Genito-Urinary ROS: Positive for - vulvar itching, raw and burning sensation at biopsy site; Negative for pelvic pain, genital discharge, dysuria, hematuria, incontinence  Objective:   BP 122/76   Pulse 77   Ht 5\' 2"  (1.575 m)   Wt 128 lb 12.8 oz (58.4 kg)   BMI 23.56 kg/m  CONSTITUTIONAL: Well-developed, well-nourished female in no acute  distress.  HENT:  Normocephalic, atraumatic. PERRLA. Oropharynx without erythema, inflammation, exudates. Uvula midline.    NECK: Anterior cervical lymphadenopathy. Normal range of motion, supple, no masses.  Normal thyroid.  SKIN: Skin is warm and dry. No rash noted. Not diaphoretic. No erythema. No pallor. [place]: Alert and oriented to person, place, and time. PSYCHIATRIC: Normal mood and affect. Normal behavior. Normal judgment and thought content. CARDIOVASCULAR: RRR RESPIRATORY: Clear to auscultation bilaterally BREASTS: Not Examined ABDOMEN: Soft, non distended; Non tender.  No Organomegaly. PELVIC:  External Genitalia: Atrophy present. Biopsy site with residual suture; no significant induration or erythema or drainage  BUS: Normal  Vagina: Atrophy present  Cervix: Normal  Uterus: Not examined  Adnexa: Normal  RV: Normal   Bladder: Nontender MUSCULOSKELETAL: Normal range of motion. No tenderness.  No cyanosis, clubbing, or edema.     Assessment:   1. Vulvar atrophy  2. Vulvar itching  3. Chronic vulvitis, biopsy verified without definitive pathology seen   Plan:   1. Counseled patient about left labial punch biopsy result: "benign vulvar tissue with scant chronic inflammation."  2. Prescribed Temovate .05% ointment BID x2 weeks, then once daily x4 weeks. Counseled patient to thoroughly massage ointment into sites of vulvar irritation.   3. Patient to return to office for follow-up in 6 weeks.   A total of 15 minutes were spent face-to-face with the patient during this encounter and over half of that time dealt with counseling and coordination of care.  Lynann Beaver, PA-S [employer] Brayton Mars, MD    I have seen, interviewed, and examined the patient in conjunction with the [facility].A. student and affirm the  physical exam findings,  Diagnosis, and management plan. Sterling Ucci A. Naydeen Speirs, MD, FACOG   Note: This dictation was prepared with  Dragon dictation along with smaller phrase technology. Any transcriptional errors that result from this process are unintentional.

## 2016-12-12 NOTE — Patient Instructions (Signed)
1. Apply Temovate ointment 0.05% topically twice a day for 2 weeks, then once a day for 4 weeks 2. Return in 6 weeks for follow-up 3. Consider blow drying the perineum after bathing with a hair dryer

## 2017-01-15 DIAGNOSIS — J069 Acute upper respiratory infection, unspecified: Secondary | ICD-10-CM | POA: Diagnosis not present

## 2017-01-15 DIAGNOSIS — J309 Allergic rhinitis, unspecified: Secondary | ICD-10-CM | POA: Diagnosis not present

## 2017-01-23 ENCOUNTER — Encounter: Payer: Medicare Other | Admitting: Obstetrics and Gynecology

## 2017-02-05 DIAGNOSIS — J301 Allergic rhinitis due to pollen: Secondary | ICD-10-CM | POA: Diagnosis not present

## 2017-02-05 DIAGNOSIS — R05 Cough: Secondary | ICD-10-CM | POA: Diagnosis not present

## 2017-02-05 DIAGNOSIS — R0602 Shortness of breath: Secondary | ICD-10-CM | POA: Diagnosis not present

## 2017-02-05 DIAGNOSIS — J8410 Pulmonary fibrosis, unspecified: Secondary | ICD-10-CM | POA: Diagnosis not present

## 2017-02-06 ENCOUNTER — Encounter: Payer: Medicare Other | Admitting: Obstetrics and Gynecology

## 2017-02-12 ENCOUNTER — Other Ambulatory Visit: Payer: Self-pay | Admitting: Obstetrics and Gynecology

## 2017-02-12 DIAGNOSIS — Z1231 Encounter for screening mammogram for malignant neoplasm of breast: Secondary | ICD-10-CM

## 2017-02-13 ENCOUNTER — Ambulatory Visit (INDEPENDENT_AMBULATORY_CARE_PROVIDER_SITE_OTHER): Payer: Medicare Other | Admitting: Obstetrics and Gynecology

## 2017-02-13 ENCOUNTER — Encounter: Payer: Self-pay | Admitting: Obstetrics and Gynecology

## 2017-02-13 VITALS — BP 128/81 | HR 72 | Ht 62.0 in | Wt 125.6 lb

## 2017-02-13 DIAGNOSIS — N763 Subacute and chronic vulvitis: Secondary | ICD-10-CM

## 2017-02-13 DIAGNOSIS — L292 Pruritus vulvae: Secondary | ICD-10-CM

## 2017-02-13 NOTE — Progress Notes (Signed)
Chief complaint: 1. Chronic vulvitis  Patient presents for 6 week follow-up. She has now completed a course of Temovate ointment 0.05% topically twice a day for 2 weeks, followed by once a day for 4 weeks. She now is only using the medication on occasion. Symptomatology has completely resolved.  Biopsy previously showed chronic inflammation, slight  Past medical history, past surgical history, problem list, medications, and allergies are reviewed  OBJECTIVE: BP 128/81   Pulse 72   Ht 5\' 2"  (1.575 m)   Wt 125 lb 9.6 oz (57 kg)   BMI 22.97 kg/m  Pleasant female in no acute distress Pelvic exam:   External Genitalia: Vulvar atrophy with agglutination of labia minora to the labia majora; minimal hyperemia noted along the right labia majora; no leukoplakia; no epithelial skin breakdown; no ulceration. Biopsy site not identifiable             BUS: Normal             Vagina: Atrophy present  ASSESSMENT: 1. Chronic vulvitis, symptoms resolved following steroid therapy  PLAN: 1. May use Temovate ointment 0.05% topically once or twice a week if vulvar itching recurs. 2. Should symptoms return significant irritation, return for follow-up  A total of 15 minutes were spent face-to-face with the patient during this encounter and over half of that time dealt with counseling and coordination of care.  Brayton Mars, MD  Note: This dictation was prepared with Dragon dictation along with smaller phrase technology. Any transcriptional errors that result from this process are unintentional.

## 2017-02-13 NOTE — Patient Instructions (Signed)
1. May continue to use Temovate ointment once or twice a week topically to the vulva as needed if symptoms recur. 2. Follow-up as needed

## 2017-03-12 ENCOUNTER — Ambulatory Visit
Admission: RE | Admit: 2017-03-12 | Discharge: 2017-03-12 | Disposition: A | Discharge status: Home / Self Care | Payer: Medicare Other | Source: Ambulatory Visit | Attending: Obstetrics and Gynecology | Admitting: Obstetrics and Gynecology

## 2017-03-12 DIAGNOSIS — Z1231 Encounter for screening mammogram for malignant neoplasm of breast: Secondary | ICD-10-CM | POA: Diagnosis not present

## 2017-03-22 ENCOUNTER — Other Ambulatory Visit: Payer: Self-pay | Admitting: Family Medicine

## 2017-05-01 DIAGNOSIS — R0602 Shortness of breath: Secondary | ICD-10-CM | POA: Diagnosis not present

## 2017-06-05 ENCOUNTER — Encounter: Payer: Self-pay | Admitting: Physician Assistant

## 2017-07-15 ENCOUNTER — Encounter: Payer: Medicare Other | Admitting: Family Medicine

## 2017-07-23 ENCOUNTER — Ambulatory Visit (INDEPENDENT_AMBULATORY_CARE_PROVIDER_SITE_OTHER): Payer: Medicare Other

## 2017-07-23 VITALS — BP 128/86 | HR 68 | Temp 97.9°F | Ht 62.0 in | Wt 128.8 lb

## 2017-07-23 DIAGNOSIS — Z23 Encounter for immunization: Secondary | ICD-10-CM | POA: Diagnosis not present

## 2017-07-23 DIAGNOSIS — Z0000 Encounter for general adult medical examination without abnormal findings: Secondary | ICD-10-CM

## 2017-07-23 NOTE — Patient Instructions (Signed)
Karen Henson , Thank you for taking time to come for your Medicare Wellness Visit. I appreciate your ongoing commitment to your health goals. Please review the following plan we discussed and let me know if I can assist you in the future.   Screening recommendations/referrals: Colonoscopy: Up to date Mammogram: Up to date Bone Density: Up to date Recommended yearly ophthalmology/optometry visit for glaucoma screening and checkup Recommended yearly dental visit for hygiene and checkup  Vaccinations: Influenza vaccine: completed Pneumococcal vaccine: completed series Tdap vaccine: Up to date Shingles vaccine: completed 03/2009  Advanced directives: Please bring a copy of your POA (Power of [place]) and/or Living Will to your next appointment.   Conditions/risks identified: None.  Next appointment: 08/05/17 @ 9:00 AM   Preventive Care 65 Years and Older, Female Preventive care refers to lifestyle choices and visits with your health care provider that can promote health and wellness. What does preventive care include?  A yearly physical exam. This is also called an annual well check.  Dental exams once or twice a year.  Routine eye exams. Ask your health care provider how often you should have your eyes checked.  Personal lifestyle choices, including:  Daily care of your teeth and gums.  Regular physical activity.  Eating a healthy diet.  Avoiding tobacco and drug use.  Limiting alcohol use.  Practicing safe sex.  Taking low-dose aspirin every day.  Taking vitamin and mineral supplements as recommended by your health care provider. What happens during an annual well check? The services and screenings done by your health care provider during your annual well check will depend on your age, overall health, lifestyle risk factors, and family history of disease. Counseling  Your health care provider may ask you questions about your:  Alcohol use.  Tobacco use.  Drug  use.  Emotional well-being.  Home and relationship well-being.  Sexual activity.  Eating habits.  History of falls.  Memory and ability to understand (cognition).  Work and work Statistician.  Reproductive health. Screening  You may have the following tests or measurements:  Height, weight, and BMI.  Blood pressure.  Lipid and cholesterol levels. These may be checked every 5 years, or more frequently if you are over 57 years old.  Skin check.  Lung cancer screening. You may have this screening every year starting at age 54 if you have a 30-pack-year history of smoking and currently smoke or have quit within the past 15 years.  Fecal occult blood test (FOBT) of the stool. You may have this test every year starting at age 68.  Flexible sigmoidoscopy or colonoscopy. You may have a sigmoidoscopy every 5 years or a colonoscopy every 10 years starting at age 69.  Hepatitis C blood test.  Hepatitis B blood test.  Sexually transmitted disease (STD) testing.  Diabetes screening. This is done by checking your blood sugar (glucose) after you have not eaten for a while (fasting). You may have this done every 1-3 years.  Bone density scan. This is done to screen for osteoporosis. You may have this done starting at age 40.  Mammogram. This may be done every 1-2 years. Talk to your health care provider about how often you should have regular mammograms. Talk with your health care provider about your test results, treatment options, and if necessary, the need for more tests. Vaccines  Your health care provider may recommend certain vaccines, such as:  Influenza vaccine. This is recommended every year.  Tetanus, diphtheria, and acellular pertussis (Tdap,  Td) vaccine. You may need a Td booster every 10 years.  Zoster vaccine. You may need this after age 50.  Pneumococcal 13-valent conjugate (PCV13) vaccine. One dose is recommended after age 66.  Pneumococcal polysaccharide  (PPSV23) vaccine. One dose is recommended after age 37. Talk to your health care provider about which screenings and vaccines you need and how often you need them. This information is not intended to replace advice given to you by your health care provider. Make sure you discuss any questions you have with your health care provider. Document Released: 10/14/2015 Document Revised: 06/06/2016 Document Reviewed: 07/19/2015 Elsevier Interactive Patient Education  2017 [place] Prevention in the Home Falls can cause injuries. They can happen to people of all ages. There are many things you can do to make your home safe and to help prevent falls. What can I do on the outside of my home?  Regularly fix the edges of walkways and driveways and fix any cracks.  Remove anything that might make you trip as you walk through a door, such as a raised step or threshold.  Trim any bushes or trees on the path to your home.  Use bright outdoor lighting.  Clear any walking paths of anything that might make someone trip, such as rocks or tools.  Regularly check to see if handrails are loose or broken. Make sure that both sides of any steps have handrails.  Any raised decks and porches should have guardrails on the edges.  Have any leaves, snow, or ice cleared regularly.  Use sand or salt on walking paths during winter.  Clean up any spills in your garage right away. This includes oil or grease spills. What can I do in the bathroom?  Use night lights.  Install grab bars by the toilet and in the tub and shower. Do not use towel bars as grab bars.  Use non-skid mats or decals in the tub or shower.  If you need to sit down in the shower, use a plastic, non-slip stool.  Keep the floor dry. Clean up any water that spills on the floor as soon as it happens.  Remove soap buildup in the tub or shower regularly.  Attach bath mats securely with double-sided non-slip rug tape.  Do not have  throw rugs and other things on the floor that can make you trip. What can I do in the bedroom?  Use night lights.  Make sure that you have a light by your bed that is easy to reach.  Do not use any sheets or blankets that are too big for your bed. They should not hang down onto the floor.  Have a firm chair that has side arms. You can use this for support while you get dressed.  Do not have throw rugs and other things on the floor that can make you trip. What can I do in the kitchen?  Clean up any spills right away.  Avoid walking on wet floors.  Keep items that you use a lot in easy-to-reach places.  If you need to reach something above you, use a strong step stool that has a grab bar.  Keep electrical cords out of the way.  Do not use floor polish or wax that makes floors slippery. If you must use wax, use non-skid floor wax.  Do not have throw rugs and other things on the floor that can make you trip. What can I do with my stairs?  Do not  leave any items on the stairs.  Make sure that there are handrails on both sides of the stairs and use them. Fix handrails that are broken or loose. Make sure that handrails are as long as the stairways.  Check any carpeting to make sure that it is firmly attached to the stairs. Fix any carpet that is loose or worn.  Avoid having throw rugs at the top or bottom of the stairs. If you do have throw rugs, attach them to the floor with carpet tape.  Make sure that you have a light switch at the top of the stairs and the bottom of the stairs. If you do not have them, ask someone to add them for you. What else can I do to help prevent falls?  Wear shoes that:  Do not have high heels.  Have rubber bottoms.  Are comfortable and fit you well.  Are closed at the toe. Do not wear sandals.  If you use a stepladder:  Make sure that it is fully opened. Do not climb a closed stepladder.  Make sure that both sides of the stepladder are  locked into place.  Ask someone to hold it for you, if possible.  Clearly mark and make sure that you can see:  Any grab bars or handrails.  First and last steps.  Where the edge of each step is.  Use tools that help you move around (mobility aids) if they are needed. These include:  Canes.  Walkers.  Scooters.  Crutches.  Turn on the lights when you go into a dark area. Replace any light bulbs as soon as they burn out.  Set up your furniture so you have a clear path. Avoid moving your furniture around.  If any of your floors are uneven, fix them.  If there are any pets around you, be aware of where they are.  Review your medicines with your doctor. Some medicines can make you feel dizzy. This can increase your chance of falling. Ask your doctor what other things that you can do to help prevent falls. This information is not intended to replace advice given to you by your health care provider. Make sure you discuss any questions you have with your health care provider. Document Released: 07/14/2009 Document Revised: 02/23/2016 Document Reviewed: 10/22/2014 Elsevier Interactive Patient Education  2017 [employer].

## 2017-07-23 NOTE — Progress Notes (Signed)
Subjective:   Karen Henson is a 76 y.o. female who presents for Medicare Annual (Subsequent) preventive examination.  Review of Systems:  N/A  Cardiac Risk Factors include: advanced age (>83men, >54 women);hypertension     Objective:     Vitals: BP 128/86 (BP Location: Left Arm)   Pulse 68   Temp 97.9 F (36.6 C) (Oral)   Ht 5\' 2"  (1.575 m)   Wt 128 lb 12.8 oz (58.4 kg)   BMI 23.56 kg/m   Body mass index is 23.56 kg/m.   Tobacco History  Smoking Status  . Never Smoker  Smokeless Tobacco  . Never Used     Counseling given: Not Answered   Past Medical History:  Diagnosis Date  . Hypertension   . Lung fibrosis [facility])    Past Surgical History:  Procedure Laterality Date  . BREAST BIOPSY Left 1996   neg  . BREAST LUMPECTOMY Left   . COLONOSCOPY  02/15/2006  . COLONOSCOPY WITH PROPOFOL N/A 03/21/2016   Procedure: COLONOSCOPY WITH PROPOFOL;  Surgeon: Robert Bellow, MD;  Location: [facility] ENDOSCOPY;  Service: Endoscopy;  Laterality: N/A;  . DILATION AND CURETTAGE OF UTERUS    . VARICOSE VEIN SURGERY     Family History  Problem Relation Age of Onset  . Hypertension Mother   . Emphysema Father   . Stroke Sister   . Stroke Brother   . Diabetes Maternal Aunt   . Diabetes Maternal Grandmother   . Lung cancer Grandchild   . Breast cancer Neg Hx   . Ovarian cancer Neg Hx   . Colon cancer Neg Hx    History  Sexual Activity  . Sexual activity: No    Outpatient Encounter Prescriptions as of 07/23/2017  Medication Sig  . lisinopril-hydrochlorothiazide (PRINZIDE,ZESTORETIC) 10-12.5 MG tablet TAKE 1 TABLET BY MOUTH ONCE DAILY  . loratadine (CLARITIN) 10 MG tablet Take 10 mg by mouth daily.   . clobetasol ointment (TEMOVATE) 7.35 % Apply 1 application topically 2 (two) times daily. (Patient not taking: Reported on 07/23/2017)   No facility-administered encounter medications on file as of 07/23/2017.     Activities of Daily Living In your present state of  health, do you have any difficulty performing the following activities: 07/23/2017  Hearing? N  Vision? N  Difficulty concentrating or making decisions? N  Walking or climbing stairs? N  Dressing or bathing? N  Doing errands, shopping? N  Preparing Food and eating ? N  Using the Toilet? N  In the past six months, have you accidently leaked urine? N  Do you have problems with loss of bowel control? N  Managing your Medications? N  Managing your Finances? N  Housekeeping or managing your Housekeeping? N  Some recent data might be hidden    Patient Care Team: Virginia Crews, MD as PCP - General (Family Medicine) Bary Castilla, Forest Gleason, MD (General Surgery) Allyne Gee, MD as Consulting Physician (Internal Medicine)    Assessment:     Exercise Activities and Dietary recommendations Current Exercise Habits: Home exercise routine, Type of exercise: walking, Time (Minutes): 30, Frequency (Times/Week): 3, Weekly Exercise (Minutes/Week): 90, Intensity: Mild  Goals    None     Fall Risk Fall Risk  07/23/2017 01/30/2016  Falls in the past year? No No   Depression Screen PHQ 2/9 Scores 07/23/2017 01/30/2016  PHQ - 2 Score 0 0     Cognitive Function: Pt declined screening today.  Immunization History  Administered Date(s) Administered  . Influenza, High Dose Seasonal PF 07/23/2017  . Influenza-Unspecified 08/01/2015  . Pneumococcal Conjugate-13 08/01/2015  . Pneumococcal Polysaccharide-23 07/26/2014  . Td 02/26/2008  . Zoster 03/17/2009   Screening Tests Health Maintenance  Topic Date Due  . TETANUS/TDAP  02/25/2018  . COLONOSCOPY  03/22/2021  . INFLUENZA VACCINE  Completed  . DEXA SCAN  Completed  . PNA vac Low Risk Adult  Completed      Plan:  I have personally reviewed and addressed the Medicare Annual Wellness questionnaire and have noted the following in the patient's chart:  A. Medical and social history B. Use of alcohol, tobacco or illicit drugs    C. Current medications and supplements D. Functional ability and status E.  Nutritional status F.  Physical activity G. Advance directives H. List of other physicians I.  Hospitalizations, surgeries, and ER visits in previous 12 months J.  [place] such as hearing and vision if needed, cognitive and depression L. Referrals and appointments - none  In addition, I have reviewed and discussed with patient certain preventive protocols, quality metrics, and best practice recommendations. A written personalized care plan for preventive services as well as general preventive health recommendations were provided to patient.  See attached scanned questionnaire for additional information.   Signed,  Fabio Neighbors, LPN Nurse Health Advisor   MD Recommendations: None.

## 2017-07-24 ENCOUNTER — Other Ambulatory Visit: Payer: Self-pay | Admitting: Family Medicine

## 2017-08-05 ENCOUNTER — Other Ambulatory Visit: Payer: Self-pay | Admitting: Family Medicine

## 2017-08-05 ENCOUNTER — Encounter: Payer: Self-pay | Admitting: Family Medicine

## 2017-08-05 ENCOUNTER — Ambulatory Visit (INDEPENDENT_AMBULATORY_CARE_PROVIDER_SITE_OTHER): Payer: Medicare Other | Admitting: Family Medicine

## 2017-08-05 VITALS — BP 124/74 | HR 66 | Temp 97.4°F | Resp 16 | Ht 62.0 in | Wt 127.8 lb

## 2017-08-05 DIAGNOSIS — M810 Age-related osteoporosis without current pathological fracture: Secondary | ICD-10-CM | POA: Diagnosis not present

## 2017-08-05 DIAGNOSIS — I10 Essential (primary) hypertension: Secondary | ICD-10-CM | POA: Diagnosis not present

## 2017-08-05 DIAGNOSIS — R053 Chronic cough: Secondary | ICD-10-CM

## 2017-08-05 DIAGNOSIS — Z0000 Encounter for general adult medical examination without abnormal findings: Secondary | ICD-10-CM

## 2017-08-05 DIAGNOSIS — R05 Cough: Secondary | ICD-10-CM | POA: Diagnosis not present

## 2017-08-05 DIAGNOSIS — Z86010 Personal history of colonic polyps: Secondary | ICD-10-CM

## 2017-08-05 LAB — CBC WITH DIFFERENTIAL/PLATELET
BASOS ABS: 18 {cells}/uL (ref 0–200)
Basophils Relative: 0.3 %
Eosinophils Absolute: 189 cells/uL (ref 15–500)
Eosinophils Relative: 3.1 %
HEMATOCRIT: 42.2 % (ref 35.0–45.0)
Hemoglobin: 14.2 g/dL (ref 11.7–15.5)
LYMPHS ABS: 1354 {cells}/uL (ref 850–3900)
MCH: 31.5 pg (ref 27.0–33.0)
MCHC: 33.6 g/dL (ref 32.0–36.0)
MCV: 93.6 fL (ref 80.0–100.0)
MPV: 10.3 fL (ref 7.5–12.5)
Monocytes Relative: 13.2 %
NEUTROS PCT: 61.2 %
Neutro Abs: 3733 cells/uL (ref 1500–7800)
PLATELETS: 297 10*3/uL (ref 140–400)
RBC: 4.51 10*6/uL (ref 3.80–5.10)
RDW: 13.2 % (ref 11.0–15.0)
TOTAL LYMPHOCYTE: 22.2 %
WBC: 6.1 10*3/uL (ref 3.8–10.8)
WBCMIX: 805 {cells}/uL (ref 200–950)

## 2017-08-05 LAB — LIPID PANEL
CHOLESTEROL: 180 mg/dL (ref <200)
HDL: 60 mg/dL (ref >50)
LDL CHOLESTEROL (CALC): 98 mg/dL
Non-HDL Cholesterol (Calc): 120 mg/dL (calc) (ref <130)
TRIGLYCERIDES: 119 mg/dL (ref <150)
Total CHOL/HDL Ratio: 3 (calc) (ref <5.0)

## 2017-08-05 LAB — COMPLETE METABOLIC PANEL WITH GFR
AG Ratio: 1.9 (calc) (ref 1.0–2.5)
ALBUMIN MSPROF: 4.4 g/dL (ref 3.6–5.1)
ALKALINE PHOSPHATASE (APISO): 27 U/L — AB (ref 33–130)
ALT: 6 U/L (ref 6–29)
AST: 13 U/L (ref 10–35)
BILIRUBIN TOTAL: 0.8 mg/dL (ref 0.2–1.2)
BUN: 16 mg/dL (ref 7–25)
CHLORIDE: 99 mmol/L (ref 98–110)
CO2: 29 mmol/L (ref 20–32)
CREATININE: 0.77 mg/dL (ref 0.60–0.93)
Calcium: 10.1 mg/dL (ref 8.6–10.4)
GFR, EST AFRICAN AMERICAN: 88 mL/min/{1.73_m2} (ref >=60)
GFR, Est Non African American: 76 mL/min/{1.73_m2} (ref >=60)
GLOBULIN: 2.3 g/dL (ref 1.9–3.7)
GLUCOSE: 87 mg/dL (ref 65–99)
Potassium: 4.7 mmol/L (ref 3.5–5.3)
SODIUM: 135 mmol/L (ref 135–146)
TOTAL PROTEIN: 6.7 g/dL (ref 6.1–8.1)

## 2017-08-05 MED ORDER — LOSARTAN POTASSIUM-HCTZ 50-12.5 MG PO TABS: 1.0000 | ORAL_TABLET | Freq: Every day | ORAL | 3 refills | Status: DC

## 2017-08-05 NOTE — Addendum Note (Signed)
Addended by: Brennan Bailey on: 08/05/2017 01:20 PM   Modules accepted: Orders

## 2017-08-05 NOTE — Assessment & Plan Note (Signed)
Switch lisinopril to losartan as above to see if this helps

## 2017-08-05 NOTE — Assessment & Plan Note (Signed)
Next colonoscopy in 2022

## 2017-08-05 NOTE — Assessment & Plan Note (Signed)
Discussed osteoporosis precautions, including regular Ca/Vit D supplement, regular weight bearing exercise, maintaining a healthy weight, avoiding tobacco, fall precautions Discussed other non-bisphosphonate medication options, patient not interested at this time Recheck BMD next year

## 2017-08-05 NOTE — Assessment & Plan Note (Signed)
Well controlled Switch lisinopril to losartan in setting of chronic cough to see if this helps at all (though if it is all related to pulmonary fibrosis, this may not help) F/u in 6 months Check CMP today

## 2017-08-05 NOTE — Progress Notes (Signed)
Patient: Karen Henson, Female    DOB: 1941-04-05, 76 y.o.   MRN: [id number] Visit Date: 08/05/2017  Today's Provider: Lavon Paganini, MD   Chief Complaint  Patient presents with  . Annual Exam   Subjective:    Annual physical exam Karen Henson is a 76 y.o. female who presents today for health maintenance and complete physical. She feels well. She reports exercising about 2 days a week for about 30 minutes. She reports she is sleeping well.  ----------------------------------------------------------------- Colonoscopy- 03/22/2016 tubular adenoma repeat 5 years Mammogram- 03/12/2017 normal BMD- 02/20/2016 osteoporosis - patient previously took Fosamax but had to discontinue after developing hip pain, gastritis.  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   HENT: Negative.   Eyes: Negative.   Respiratory: Negative.   Cardiovascular: Negative.   Gastrointestinal: Negative.   Endocrine: Negative.   Genitourinary: Negative.   Musculoskeletal: Negative.   Skin: Negative.   Allergic/Immunologic: Negative.   Neurological: Negative.   Hematological: Negative.   Psychiatric/Behavioral: Negative.     Social History      She  reports that she has quit smoking. Her smoking use included cigarettes. She smoked 0.25 packs per day. she has never used smokeless tobacco. She reports that she drinks alcohol. She reports that she does not use drugs.       Social History   Socioeconomic History  . Marital status: Widowed    Spouse  name: None  . Number of children: 2  . Years of education: None  . Highest education level: None  Social Needs  . Financial resource strain: None  . Food insecurity - worry: None  . Food insecurity - inability: None  . Transportation needs - medical: None  . Transportation needs - non-medical: None  Occupational History  . Occupation: retired  Tobacco Use  . Smoking status: Former Smoker    Packs/day: 0.25    Types: Cigarettes  . Smokeless tobacco: Never Used  . Tobacco comment: smoked a pack a week, quit about 30 years ago  Substance and Sexual Activity  . Alcohol use: Yes    Comment: ocassional- monthly  . Drug use: No  . Sexual activity: No  Other Topics Concern  . None  Social History Narrative  . None    Past Medical History:  Diagnosis Date  . Hypertension   . Lung fibrosis [facility])      Patient Active Problem List   Diagnosis Date Noted  . 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 History of adenomatous polyp of colon 05/30/2016  . Allergic rhinitis 11/09/2015  . 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 BP (high blood pressure) 11/09/2015  . Fibrosis lung ([place]) 11/09/2015  . 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 OP (osteoporosis) 11/09/2015    Past Surgical History:  Procedure Laterality Date  . BREAST BIOPSY Left 1996   neg  . BREAST LUMPECTOMY Left   . COLONOSCOPY  02/15/2006  . DILATION AND CURETTAGE OF UTERUS    . VARICOSE VEIN SURGERY  Family History        Family Status  Relation Name Status  . Mother  Deceased at age 67  . Father  Deceased at age 72  . Sister  Deceased  . Brother  Deceased  . Mat Aunt  (Not Specified)  . [employer]  (Not Specified)  . [employer]  . Neg Hx  (Not Specified)        Her family history includes Diabetes in her maternal aunt and maternal grandmother; Emphysema in her father; Hypertension in her mother; Lung cancer in her grandchild; Stroke in her brother and sister.     No Known  Allergies   Current Outpatient Medications:  .  lisinopril-hydrochlorothiazide (PRINZIDE,ZESTORETIC) 10-12.5 MG tablet, TAKE 1 TABLET BY MOUTH ONCE DAILY, Disp: 30 tablet, Rfl: 1 .  loratadine (CLARITIN) 10 MG tablet, Take 10 mg by mouth daily. , Disp: , Rfl:  .  clobetasol ointment (TEMOVATE) 2.95 %, Apply 1 application topically 2 (two) times daily. (Patient not taking: Reported on 07/23/2017), Disp: 30 g, Rfl: 1 .  montelukast (SINGULAIR) 10 MG tablet, , Disp: , Rfl:    Patient Care Team: Virginia Crews, MD as PCP - General (Family Medicine) Bary Castilla, Forest Gleason, MD (General Surgery) Allyne Gee, MD as Consulting Physician (Internal Medicine)      Objective:   Vitals: BP 124/74 (BP Location: Left Arm, Patient Position: Sitting, Cuff Size: Normal)   Pulse 66   Temp (!) 97.4 F (36.3 C) (Oral)   Resp 16   Ht 5\' 2"  (1.575 m)   Wt 127 lb 12.8 oz (58 kg)   BMI 23.37 kg/m    Vitals:   08/05/17 0920  BP: 124/74  Pulse: 66  Resp: 16  Temp: (!) 97.4 F (36.3 C)  TempSrc: Oral  Weight: 127 lb 12.8 oz (58 kg)  Height: 5\' 2"  (1.575 m)     Physical Exam  Constitutional: She is oriented to person, place, and time. She appears well-developed and well-nourished.  HENT:  Head: Normocephalic and atraumatic.  Right Ear: External ear normal.  Left Ear: External ear normal.  Nose: Nose normal.  Mouth/Throat: Oropharynx is clear and moist. No oropharyngeal exudate.  Eyes: Conjunctivae and EOM are normal. Pupils are equal, round, and reactive to light.  Neck: Neck supple. No thyromegaly present.  Cardiovascular: Normal rate, regular rhythm, normal heart sounds and intact distal pulses.  No murmur heard. Pulmonary/Chest: Effort normal and breath sounds normal. No respiratory distress. She has no wheezes. She has no rales.  Breasts: not examined, given recent normal mammogram.  Abdominal: Soft. Bowel sounds are normal. She exhibits no distension. There is no tenderness. There is  no rebound and no guarding.  Musculoskeletal: She exhibits no edema or deformity.  Lymphadenopathy:    She has no cervical adenopathy.  Neurological: She is alert and oriented to person, place, and time. No cranial nerve deficit.  Skin: Skin is warm and dry. No rash noted.  Psychiatric: She has a normal mood and affect. Her behavior is normal.  Vitals reviewed.    Depression Screen PHQ 2/9 Scores 07/23/2017 01/30/2016  PHQ - 2 Score 0 0      Assessment & Plan:     Routine Health Maintenance and Physical Exam  Exercise Activities and Dietary recommendations Goals    None      Immunization History  Administered Date(s) Administered  . Influenza, High Dose Seasonal PF 07/23/2017  . Influenza-Unspecified 08/01/2015  . Pneumococcal Conjugate-13 08/01/2015  .  Pneumococcal Polysaccharide-23 07/26/2014  . Td 02/26/2008  . Zoster 03/17/2009    Health Maintenance  Topic Date Due  . Samul Dada  02/25/2018  . COLONOSCOPY  03/22/2021  . INFLUENZA VACCINE  Completed  . DEXA SCAN  Completed  . PNA vac Low Risk Adult  Completed     Discussed health benefits of physical activity, and encouraged her to engage in regular exercise appropriate for her age and condition.    Problem List Items Addressed This Visit      Cardiovascular and Mediastinum   BP (high blood pressure)    Well controlled Switch lisinopril to losartan in setting of chronic cough to see if this helps at all (though if it is all related to pulmonary fibrosis, this may not help) F/u in 6 months Check CMP today      Relevant Medications   losartan-hydrochlorothiazide (HYZAAR) 50-12.5 MG tablet   Other Relevant Orders   COMPLETE METABOLIC PANEL WITH GFR     Musculoskeletal and Integument   OP (osteoporosis)    Discussed osteoporosis precautions, including regular Ca/Vit D supplement, regular weight bearing exercise, maintaining a healthy weight, avoiding tobacco, fall precautions Discussed other  non-bisphosphonate medication options, patient not interested at this time Recheck BMD next year        Other   History of adenomatous polyp of colon    Next colonoscopy in 2022      Chronic cough    Switch lisinopril to losartan as above to see if this helps       Other Visit Diagnoses    Healthcare maintenance    -  Primary   Relevant Orders   Lipid panel   COMPLETE METABOLIC PANEL WITH GFR   CBC w/Diff/Platelet      Return in about 6 months (around 02/02/2018) for BP f/u.  --------------------------------------------------------------------  The entirety of the information documented in the History of Present Illness, Review of Systems and Physical Exam were personally obtained by me. Portions of this information were initially documented by [place], [place] and reviewed by me for thoroughness and accuracy.    Lavon Paganini, MD  [facility]

## 2017-08-05 NOTE — Patient Instructions (Signed)
Preventive Care 65 Years and Older, Female Preventive care refers to lifestyle choices and visits with your health care provider that can promote health and wellness. What does preventive care include?  A yearly physical exam. This is also called an annual well check.  Dental exams once or twice a year.  Routine eye exams. Ask your health care provider how often you should have your eyes checked.  Personal lifestyle choices, including: ? Daily care of your teeth and gums. ? Regular physical activity. ? Eating a healthy diet. ? Avoiding tobacco and drug use. ? Limiting alcohol use. ? Practicing safe sex. ? Taking low-dose aspirin every day. ? Taking vitamin and mineral supplements as recommended by your health care provider. What happens during an annual well check? The services and screenings done by your health care provider during your annual well check will depend on your age, overall health, lifestyle risk factors, and family history of disease. Counseling Your health care provider may ask you questions about your:  Alcohol use.  Tobacco use.  Drug use.  Emotional well-being.  Home and relationship well-being.  Sexual activity.  Eating habits.  History of falls.  Memory and ability to understand (cognition).  Work and work environment.  Reproductive health.  Screening You may have the following tests or measurements:  Height, weight, and BMI.  Blood pressure.  Lipid and cholesterol levels. These may be checked every 5 years, or more frequently if you are over 50 years old.  Skin check.  Lung cancer screening. You may have this screening every year starting at age 55 if you have a 30-pack-year history of smoking and currently smoke or have quit within the past 15 years.  Fecal occult blood test (FOBT) of the stool. You may have this test every year starting at age 50.  Flexible sigmoidoscopy or colonoscopy. You may have a sigmoidoscopy every 5 years or  a colonoscopy every 10 years starting at age 50.  Hepatitis C blood test.  Hepatitis B blood test.  Sexually transmitted disease (STD) testing.  Diabetes screening. This is done by checking your blood sugar (glucose) after you have not eaten for a while (fasting). You may have this done every 1-3 years.  Bone density scan. This is done to screen for osteoporosis. You may have this done starting at age 75.  Mammogram. This may be done every 1-2 years. Talk to your health care provider about how often you should have regular mammograms.  Talk with your health care provider about your test results, treatment options, and if necessary, the need for more tests. Vaccines Your health care provider may recommend certain vaccines, such as:  Influenza vaccine. This is recommended every year.  Tetanus, diphtheria, and acellular pertussis (Tdap, Td) vaccine. You may need a Td booster every 10 years.  Varicella vaccine. You may need this if you have not been vaccinated.  Zoster vaccine. You may need this after age 60.  Measles, mumps, and rubella (MMR) vaccine. You may need at least one dose of MMR if you were born in 1957 or later. You may also need a second dose.  Pneumococcal 13-valent conjugate (PCV13) vaccine. One dose is recommended after age 75.  Pneumococcal polysaccharide (PPSV23) vaccine. One dose is recommended after age 75.  Meningococcal vaccine. You may need this if you have certain conditions.  Hepatitis A vaccine. You may need this if you have certain conditions or if you travel or work in places where you may be exposed to hepatitis   A.  Hepatitis B vaccine. You may need this if you have certain conditions or if you travel or work in places where you may be exposed to hepatitis B.  Haemophilus influenzae type b (Hib) vaccine. You may need this if you have certain conditions.  Talk to your health care provider about which screenings and vaccines you need and how often you  need them. This information is not intended to replace advice given to you by your health care provider. Make sure you discuss any questions you have with your health care provider. Document Released: 10/14/2015 Document Revised: 06/06/2016 Document Reviewed: 07/19/2015 Elsevier Interactive Patient Education  2017 [employer].

## 2017-08-06 ENCOUNTER — Telehealth: Payer: Self-pay

## 2017-08-06 MED ORDER — LOSARTAN POTASSIUM-HCTZ 50-12.5 MG PO TABS: 1.0000 | ORAL_TABLET | Freq: Every day | ORAL | 3 refills | Status: DC

## 2017-08-06 NOTE — Telephone Encounter (Signed)
Pt advised of results. 

## 2017-08-06 NOTE — Telephone Encounter (Signed)
-----   Message from Virginia Crews, MD sent at 08/06/2017  8:08 AM EST ----- Cholesterol is at goal.  Continue Zocor.  Normal kidney function, liver function, electrolytes, Blood counts.  Virginia Crews, MD, MPH [facility] 08/06/2017 8:08 AM

## 2017-08-06 NOTE — Addendum Note (Signed)
Addended by: Brennan Bailey on: 08/06/2017 08:03 AM   Modules accepted: Orders

## 2017-08-26 DIAGNOSIS — H2513 Age-related nuclear cataract, bilateral: Secondary | ICD-10-CM | POA: Diagnosis not present

## 2017-10-03 ENCOUNTER — Ambulatory Visit (INDEPENDENT_AMBULATORY_CARE_PROVIDER_SITE_OTHER): Payer: Medicare Other | Admitting: Internal Medicine

## 2017-10-03 ENCOUNTER — Other Ambulatory Visit: Payer: Self-pay

## 2017-10-03 ENCOUNTER — Other Ambulatory Visit: Payer: Self-pay | Admitting: Nurse Practitioner

## 2017-10-03 ENCOUNTER — Encounter: Payer: Self-pay | Admitting: Internal Medicine

## 2017-10-03 VITALS — BP 132/82 | HR 70 | Resp 16 | Ht 62.0 in | Wt 130.0 lb

## 2017-10-03 DIAGNOSIS — R05 Cough: Secondary | ICD-10-CM

## 2017-10-03 DIAGNOSIS — J301 Allergic rhinitis due to pollen: Secondary | ICD-10-CM

## 2017-10-03 DIAGNOSIS — J84112 Idiopathic pulmonary fibrosis: Secondary | ICD-10-CM | POA: Diagnosis not present

## 2017-10-03 DIAGNOSIS — J209 Acute bronchitis, unspecified: Secondary | ICD-10-CM

## 2017-10-03 DIAGNOSIS — J440 Chronic obstructive pulmonary disease with acute lower respiratory infection: Secondary | ICD-10-CM | POA: Diagnosis not present

## 2017-10-03 DIAGNOSIS — R059 Cough, unspecified: Secondary | ICD-10-CM

## 2017-10-03 MED ORDER — HYDROCOD POLST-CPM POLST ER 10-8 MG/5ML PO SUER: 5.0000 mL | Freq: Two times a day (BID) | ORAL | 0 refills | Status: DC | PRN

## 2017-10-03 MED ORDER — PREDNISONE 10 MG PO TABS: ORAL_TABLET | ORAL | 0 refills | Status: DC

## 2017-10-03 MED ORDER — HYDROCOD POLST-CPM POLST ER 10-8 MG/5ML PO SUER: 5.0000 mL | Freq: Two times a day (BID) | ORAL | Status: DC

## 2017-10-03 MED ORDER — BUDESONIDE-FORMOTEROL FUMARATE 80-4.5 MCG/ACT IN AERO: 2.0000 | INHALATION_SPRAY | Freq: Two times a day (BID) | RESPIRATORY_TRACT | 3 refills | Status: DC

## 2017-10-03 MED ORDER — LEVOFLOXACIN 500 MG PO TABS: 500.0000 mg | ORAL_TABLET | Freq: Every day | ORAL | 0 refills | Status: DC

## 2017-10-03 NOTE — Progress Notes (Signed)
[facility] [place], [place] [address]  Pulmonary Sleep Medicine  Office Visit Note  Patient Name: Karen Henson DOB: [birth year] MRN [id number]  Date of Service: 10/03/2017     Complaints/HPI:  She presents with cough which is been going on for several days.  Patient has noted some sputum production.  There is no hemoptysis noted pressure she denies having any chest pain.  There is no congestion noted.  Patient has had no fevers but has felt feverish.  Patient states that she has no nausea no vomiting or diarrhea.  She has noted postnasal drip.  Her cough is worse than usual.  She states that typically it is a dry cough now it is more weight.  She was on lisinopril and her primary care physician had changed that to losartan.  Current Medication: Outpatient Encounter Medications as of 10/03/2017  Medication Sig Note  . loratadine (CLARITIN) 10 MG tablet Take 10 mg by mouth daily.  11/09/2015: Received from: [facility]  . losartan-hydrochlorothiazide (HYZAAR) 50-12.5 MG tablet Take 1 tablet daily by mouth.   . montelukast (SINGULAIR) 10 MG tablet    . clobetasol ointment (TEMOVATE) 0.96 % Apply 1 application topically 2 (two) times daily. (Patient not taking: Reported on 07/23/2017)    No facility-administered encounter medications on file as of 10/03/2017.     Surgical History: Past Surgical History:  Procedure Laterality Date  . BREAST BIOPSY Left 1996   neg  . BREAST LUMPECTOMY Left   . COLONOSCOPY  02/15/2006  . COLONOSCOPY WITH PROPOFOL N/A 03/21/2016   Procedure: COLONOSCOPY WITH PROPOFOL;  Surgeon: Robert Bellow, MD;  Location: [facility] ENDOSCOPY;  Service: Endoscopy;  Laterality: N/A;  . DILATION AND CURETTAGE OF UTERUS    . VARICOSE VEIN SURGERY      Medical History: Past Medical History:  Diagnosis Date  . Hypertension   . 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[place])     Family History: Family History  Problem Relation Age of Onset  .  Hypertension Mother   . Emphysema Father   . Stroke Sister   . Stroke Brother   . Diabetes Maternal Aunt   . Diabetes Maternal Grandmother   . Lung cancer Grandchild   . Breast cancer Neg Hx   . Ovarian cancer Neg Hx   . Colon cancer Neg Hx     Social History: Social History   Socioeconomic History  . Marital status: Widowed    Spouse name: Not on file  . Number of children: 2  . Years of education: Not on file  . Highest education level: Not on file  Social Needs  . Financial resource strain: Not on file  . Food insecurity - worry: Not on file  . Food insecurity - inability: Not on file  . Transportation needs - medical: Not on file  . Transportation needs - non-medical: Not on file  Occupational History  . Occupation: retired  Tobacco Use  . Smoking status: Former Smoker    Packs/day: 0.25    Types: Cigarettes  . Smokeless tobacco: Never Used  . Tobacco comment: smoked a pack a week, quit about 30 years ago  Substance and Sexual Activity  . Alcohol use: Yes    Comment: ocassional- monthly  . Drug use: No  . Sexual activity: No  Other Topics Concern  . Not on file  Social History Narrative  . Not on file     ROS  General: (-) fever, (-) chills, (-) night sweats, (-)  weakness, Skin: (-) rashes, (-) itching,. Eyes: (-) visual changes, (-) redness, (-) itching, (-) double or blurred vision. Nose and Sinuses: (-) nasal stuffiness or itchiness, (-) postnasal drip, (-) nosebleeds, (-) sinus trouble. Mouth and Throat: (-) sore throat, (-) hoarseness. Neck: (-) swollen glands, (-) enlarged thyroid, (-) neck pain. Respiratory: + cough with sputum, (-) bloody sputum, +shortness of breath, (-) wheezing. Cardiovascular: (-) ankle swelling, (-) chest pain. Lymphatic: (-) lymph node enlargement, (-) lymph node tenderness. Neurologic: (-) numbness, (-) tingling,(-) dizziness. Psychiatric: (-) anxiety, (-) depression.  Vital Signs: Blood pressure 132/82, pulse 70, resp.  rate 16, height 5\' 2"  (1.575 m), weight 130 lb (59 kg), SpO2 93 %.  Examination: General Appearance: The patient is well-developed, well-nourished, and in no distress. Skin: Gross inspection of skin demonstrates no evidence of abnormality. Head: Patient's head is normocephalic, no gross deformities. Eyes: no gross deformities noted. ENT: ears appear grossly normal. Nasopharynx appears to be normal. Neck: Supple. No thyromegaly. No LAD. Respiratory: Lungs scattered rhonchi and dry crackles. Cardiovascular: Normal S1 and S2 without murmur or rub. Extremities: No cyanosis. pulses are equal. Neurologic: Alert and oriented. No involuntary movements.  LABS: Recent Results (from the past 2160 hour(s))  Lipid panel     Status: None   Collection Time: 08/05/17 10:11 AM  Result Value Ref Range   Cholesterol 180 <200 mg/dL   HDL 60 >50 mg/dL   Triglycerides 119 <150 mg/dL   LDL Cholesterol (Calc) 98 mg/dL (calc)    Comment: Reference range: <100 . Desirable range <100 mg/dL for primary prevention;   <70 mg/dL for patients with CHD or diabetic patients  with > or = 2 CHD risk factors. Marland Kitchen LDL-C is now calculated using the Martin-Hopkins  calculation, which is a validated novel method providing  better accuracy than the Friedewald equation in the  estimation of LDL-C.  Cresenciano Genre et al. Annamaria Helling. 7253;664(40): 2061-2068  (http://education.[employer].com/faq/FAQ164)    Total CHOL/HDL Ratio 3.0 <5.0 (calc)   Non-HDL Cholesterol (Calc) 120 <130 mg/dL (calc)    Comment: For patients with diabetes plus 1 major ASCVD risk  factor, treating to a non-HDL-C goal of <100 mg/dL  (LDL-C of <70 mg/dL) is considered a therapeutic  option.   COMPLETE METABOLIC PANEL WITH GFR     Status: Abnormal   Collection Time: 08/05/17 10:11 AM  Result Value Ref Range   Glucose, Bld 87 65 - 99 mg/dL    Comment: .            Fasting reference interval .    BUN 16 7 - 25 mg/dL   Creat 0.77 0.60 - 0.93 mg/dL     Comment: For patients >23 years of age, the reference limit for Creatinine is approximately 13% higher for people identified as African-American. .    GFR, Est Non African American 76 > OR = 60 mL/min/1.109m2   GFR, Est African American 88 > OR = 60 mL/min/1.30m2   BUN/Creatinine Ratio NOT APPLICABLE 6 - 22 (calc)   Sodium 135 135 - 146 mmol/L   Potassium 4.7 3.5 - 5.3 mmol/L   Chloride 99 98 - 110 mmol/L   CO2 29 20 - 32 mmol/L   Calcium 10.1 8.6 - 10.4 mg/dL   Total Protein 6.7 6.1 - 8.1 g/dL   Albumin 4.4 3.6 - 5.1 g/dL   Globulin 2.3 1.9 - 3.7 g/dL (calc)   AG Ratio 1.9 1.0 - 2.5 (calc)   Total Bilirubin 0.8 0.2 - 1.2 mg/dL  Alkaline phosphatase (APISO) 27 (L) 33 - 130 U/L   AST 13 10 - 35 U/L   ALT 6 6 - 29 U/L  CBC with Differential/Platelet     Status: None   Collection Time: 08/05/17 10:11 AM  Result Value Ref Range   WBC 6.1 3.8 - 10.8 Thousand/uL   RBC 4.51 3.80 - 5.10 Million/uL   Hemoglobin 14.2 11.7 - 15.5 g/dL   HCT 42.2 35.0 - 45.0 %   MCV 93.6 80.0 - 100.0 fL   MCH 31.5 27.0 - 33.0 pg   MCHC 33.6 32.0 - 36.0 g/dL   RDW 13.2 11.0 - 15.0 %   Platelets 297 140 - 400 Thousand/uL   MPV 10.3 7.5 - 12.5 fL   Neutro Abs 3,733 1,500 - 7,800 cells/uL   Lymphs Abs 1,354 850 - 3,900 cells/uL   WBC mixed population 805 200 - 950 cells/uL   Eosinophils Absolute 189 15 - 500 cells/uL   Basophils Absolute 18 0 - 200 cells/uL   Neutrophils Relative % 61.2 %   Total Lymphocyte 22.2 %   Monocytes Relative 13.2 %   Eosinophils Relative 3.1 %   Basophils Relative 0.3 %    Radiology: Mm Screening Breast Tomo Bilateral  Result Date: 03/12/2017 CLINICAL DATA:  Screening. EXAM: 2D DIGITAL SCREENING BILATERAL MAMMOGRAM WITH CAD AND ADJUNCT TOMO COMPARISON:  Previous exam(s). ACR Breast Density Category c: The breast tissue is heterogeneously dense, which may obscure small masses. FINDINGS: There are no findings suspicious for malignancy. Images were processed with CAD.  IMPRESSION: No mammographic evidence of malignancy. A result letter of this screening mammogram will be mailed directly to the patient. RECOMMENDATION: Screening mammogram in one year. (Code:SM-B-01Y) BI-RADS CATEGORY  1: Negative. Electronically Signed   By: Marin Olp M.D.   On: 03/12/2017 13:28    No results found.  No results found.    Assessment and Plan: Patient Active Problem List   Diagnosis Date Noted  . Chronic cough 08/05/2017  . 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 History of adenomatous polyp of colon 05/30/2016  . Allergic rhinitis 11/09/2015  . 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 BP (high blood pressure) 11/09/2015  . 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 OP (osteoporosis) 11/09/2015    1. Acute bronchitis   Patient with acute exacerbation Levaquin prescription was given today Also gave her a prednisone Dosepak Will also prescribe her Tussionex for the cough acutely  2. IPF  she has been stable as far as pulmonary fibrosis is concerned We will continue with supportive care  3. Allergic rhinitis  stable at this time she will continue with present management  4. Cough  this is a chronic problem and she will be continue to monitor Right now she is having an acute exacerbation over symptoms.   General Counseling: I have discussed the findings of the evaluation and examination with [place].  I have also discussed any further diagnostic evaluation thatmay be needed or ordered today. Tuere verbalizes understanding of the findings of todays visit. We also reviewed her medications today and discussed drug interactions and side effects including but not limited excessive drowsiness and altered mental states. We also discussed that there is always a risk not just to her but also people around her. she has been encouraged to call the office with any questions or concerns that should arise related to todays  visit.    Time spent: 3min  I have  personally obtained a history, examined the patient, evaluated laboratory and imaging results, formulated the assessment and plan and placed orders.    Allyne Gee, MD [facility] Pulmonary and Critical Care Sleep medicine

## 2017-10-03 NOTE — Patient Instructions (Signed)

## 2017-10-15 DIAGNOSIS — H2512 Age-related nuclear cataract, left eye: Secondary | ICD-10-CM | POA: Diagnosis not present

## 2017-10-21 ENCOUNTER — Encounter: Payer: Self-pay | Admitting: *Deleted

## 2017-10-24 ENCOUNTER — Telehealth: Payer: Self-pay

## 2017-10-24 NOTE — Telephone Encounter (Signed)
LMTCB Called to see if patient still wanted to get Shingrix, she is on our waiting list. Looks like patient has Medicare and this insurance has been covering this injection at the pharmacy only. Need to let patient know.-Anastasiya V Hopkins, RMA

## 2017-10-24 NOTE — Telephone Encounter (Signed)
Pt advised.

## 2017-10-31 ENCOUNTER — Encounter: Payer: Self-pay | Admitting: Emergency Medicine

## 2017-10-31 ENCOUNTER — Encounter
Admission: RE | Disposition: A | Discharge status: Home / Self Care | Payer: Self-pay | Source: Ambulatory Visit | Attending: Ophthalmology

## 2017-10-31 ENCOUNTER — Ambulatory Visit: Payer: Medicare Other | Admitting: Certified Registered Nurse Anesthetist

## 2017-10-31 ENCOUNTER — Ambulatory Visit
Admission: RE | Admit: 2017-10-31 | Discharge: 2017-10-31 | Disposition: A | Discharge status: Home / Self Care | Payer: Medicare Other | Source: Ambulatory Visit | Attending: Ophthalmology | Admitting: Ophthalmology

## 2017-10-31 DIAGNOSIS — Z7982 Long term (current) use of aspirin: Secondary | ICD-10-CM | POA: Diagnosis not present

## 2017-10-31 DIAGNOSIS — Z79899 Other long term (current) drug therapy: Secondary | ICD-10-CM | POA: Diagnosis not present

## 2017-10-31 DIAGNOSIS — I10 Essential (primary) hypertension: Secondary | ICD-10-CM | POA: Diagnosis not present

## 2017-10-31 DIAGNOSIS — Z87891 Personal history of nicotine dependence: Secondary | ICD-10-CM | POA: Diagnosis not present

## 2017-10-31 DIAGNOSIS — J309 Allergic rhinitis, unspecified: Secondary | ICD-10-CM | POA: Diagnosis not present

## 2017-10-31 DIAGNOSIS — E119 Type 2 diabetes mellitus without complications: Secondary | ICD-10-CM | POA: Diagnosis not present

## 2017-10-31 DIAGNOSIS — H2512 Age-related nuclear cataract, left eye: Secondary | ICD-10-CM | POA: Insufficient documentation

## 2017-10-31 HISTORY — PX: CATARACT EXTRACTION W/PHACO: SHX586

## 2017-10-31 HISTORY — DX: Cough, unspecified: R05.9

## 2017-10-31 HISTORY — DX: Cough: R05

## 2017-10-31 HISTORY — DX: Unspecified osteoarthritis, unspecified site: M19.90

## 2017-10-31 HISTORY — DX: Dyspnea, unspecified: R06.00

## 2017-10-31 SURGERY — PHACOEMULSIFICATION, CATARACT, WITH IOL INSERTION
Anesthesia: Monitor Anesthesia Care | Site: Eye | Laterality: Left | Wound class: Clean

## 2017-10-31 MED ORDER — SODIUM HYALURONATE 10 MG/ML IO SOLN
INTRAOCULAR | Status: DC | PRN
  Administered 2017-10-31: 0.55 mL via INTRAOCULAR

## 2017-10-31 MED ORDER — MOXIFLOXACIN HCL 0.5 % OP SOLN
OPHTHALMIC | Status: AC
  Filled 2017-10-31: qty 3

## 2017-10-31 MED ORDER — POVIDONE-IODINE 5 % OP SOLN
OPHTHALMIC | Status: AC
  Filled 2017-10-31: qty 30

## 2017-10-31 MED ORDER — MOXIFLOXACIN HCL 0.5 % OP SOLN: 1.0000 [drp] | OPHTHALMIC | Status: DC | PRN

## 2017-10-31 MED ORDER — CARBACHOL 0.01 % IO SOLN
INTRAOCULAR | Status: DC | PRN
  Administered 2017-10-31: 0.5 mL via INTRAOCULAR

## 2017-10-31 MED ORDER — LIDOCAINE HCL (PF) 4 % IJ SOLN
INTRAMUSCULAR | Status: AC
  Filled 2017-10-31: qty 5

## 2017-10-31 MED ORDER — NA CHONDROIT SULF-NA HYALURON 40-17 MG/ML IO SOLN
INTRAOCULAR | Status: AC
  Filled 2017-10-31: qty 1

## 2017-10-31 MED ORDER — TETRACAINE HCL 0.5 % OP SOLN
OPHTHALMIC | Status: DC | PRN
  Administered 2017-10-31: 1 [drp] via OPHTHALMIC

## 2017-10-31 MED ORDER — EPINEPHRINE PF 1 MG/ML IJ SOLN
INTRAMUSCULAR | Status: AC
  Filled 2017-10-31: qty 2

## 2017-10-31 MED ORDER — MIDAZOLAM HCL 2 MG/2ML IJ SOLN
INTRAMUSCULAR | Status: AC
  Filled 2017-10-31: qty 2

## 2017-10-31 MED ORDER — ONDANSETRON HCL 4 MG/2ML IJ SOLN: 4.0000 mg | Freq: Once | INTRAMUSCULAR | Status: DC | PRN

## 2017-10-31 MED ORDER — MOXIFLOXACIN HCL 0.5 % OP SOLN
OPHTHALMIC | Status: DC | PRN
  Administered 2017-10-31: 0.2 mL via OPHTHALMIC

## 2017-10-31 MED ORDER — LIDOCAINE HCL (PF) 4 % IJ SOLN
INTRAOCULAR | Status: DC | PRN
  Administered 2017-10-31: 4 mL via OPHTHALMIC

## 2017-10-31 MED ORDER — ARMC OPHTHALMIC DILATING DROPS
OPHTHALMIC | Status: AC
  Administered 2017-10-31: 1 via OPHTHALMIC
  Filled 2017-10-31: qty 0.4

## 2017-10-31 MED ORDER — FENTANYL CITRATE (PF) 100 MCG/2ML IJ SOLN: 25.0000 ug | INTRAMUSCULAR | Status: DC | PRN

## 2017-10-31 MED ORDER — SODIUM HYALURONATE 23 MG/ML IO SOLN
INTRAOCULAR | Status: DC | PRN
  Administered 2017-10-31: 0.6 mL via INTRAOCULAR

## 2017-10-31 MED ORDER — SODIUM CHLORIDE 0.9 % IV SOLN
INTRAVENOUS | Status: DC
  Administered 2017-10-31: 08:00:00 via INTRAVENOUS

## 2017-10-31 MED ORDER — EPINEPHRINE PF 1 MG/ML IJ SOLN
INTRAOCULAR | Status: DC | PRN
  Administered 2017-10-31: 08:00:00 via OPHTHALMIC

## 2017-10-31 MED ORDER — SODIUM HYALURONATE 23 MG/ML IO SOLN
INTRAOCULAR | Status: AC
  Filled 2017-10-31: qty 0.6

## 2017-10-31 MED ORDER — MIDAZOLAM HCL 2 MG/2ML IJ SOLN
INTRAMUSCULAR | Status: DC | PRN
  Administered 2017-10-31 (×2): 1 mg via INTRAVENOUS

## 2017-10-31 MED ORDER — POVIDONE-IODINE 5 % OP SOLN
OPHTHALMIC | Status: DC | PRN
  Administered 2017-10-31: 1 via OPHTHALMIC

## 2017-10-31 MED ORDER — ARMC OPHTHALMIC DILATING DROPS
1.0000 | OPHTHALMIC | Status: AC
Start: 2017-10-31 — End: 2017-10-31
  Administered 2017-10-31 (×3): 1 via OPHTHALMIC

## 2017-10-31 SURGICAL SUPPLY — 16 items
DISSECTOR HYDRO NUCLEUS 50X22 (MISCELLANEOUS) ×3 IMPLANT
GLOVE BIO SURGEON STRL SZ8 (GLOVE) ×3 IMPLANT
GLOVE BIOGEL M 6.5 STRL (GLOVE) ×3 IMPLANT
GLOVE SURG LX 7.5 STRW (GLOVE) ×2
GLOVE SURG LX STRL 7.5 STRW (GLOVE) ×1 IMPLANT
GOWN STRL REUS W/ TWL LRG LVL3 (GOWN DISPOSABLE) ×2 IMPLANT
GOWN STRL REUS W/TWL LRG LVL3 (GOWN DISPOSABLE) ×6
LABEL CATARACT MEDS ST (LABEL) ×3 IMPLANT
LENS IOL TECNIS ITEC 22.0 (Intraocular Lens) ×2 IMPLANT
PACK CATARACT (MISCELLANEOUS) ×3 IMPLANT
PACK CATARACT KING (MISCELLANEOUS) ×3 IMPLANT
PACK EYE AFTER SURG (MISCELLANEOUS) ×3 IMPLANT
SOL BSS BAG (MISCELLANEOUS) ×3
SOLUTION BSS BAG (MISCELLANEOUS) ×1 IMPLANT
WATER STERILE IRR 250ML POUR (IV SOLUTION) ×3 IMPLANT
WIPE NON LINTING 3.25X3.25 (MISCELLANEOUS) ×3 IMPLANT

## 2017-10-31 NOTE — Op Note (Signed)
OPERATIVE NOTE  Karen Henson [id number] 10/31/2017   PREOPERATIVE DIAGNOSIS:  Nuclear sclerotic cataract left eye.  H25.12   POSTOPERATIVE DIAGNOSIS:    Nuclear sclerotic cataract left eye.     PROCEDURE:  Phacoemusification with posterior chamber intraocular lens placement of the left eye   LENS:   Implant Name Type Inv. Item Serial No. Manufacturer Lot No. LRB No. Used  LENS IOL DIOP 22.0 - [id number] Intraocular Lens LENS IOL DIOP 22.0 [id number]  Left 1       PCB00 +22.0   ULTRASOUND TIME: 0 minutes 43.7 seconds.  CDE 8.57   SURGEON:  Benay Pillow, MD, MPH   ANESTHESIA:  Topical with tetracaine drops augmented with 1% preservative-free intracameral lidocaine.  ESTIMATED BLOOD LOSS: <1 mL   COMPLICATIONS:  None.   DESCRIPTION OF PROCEDURE:  The patient was identified in the holding room and transported to the operating room and placed in the supine position under the operating microscope.  The left eye was identified as the operative eye and it was prepped and draped in the usual sterile ophthalmic fashion.   A 1.0 millimeter clear-corneal paracentesis was made at the 5:00 position. 0.5 ml of preservative-free 1% lidocaine with epinephrine was injected into the anterior chamber.  The anterior chamber was filled with Healon 5 viscoelastic.  A 2.4 millimeter keratome was used to make a near-clear corneal incision at the 2:00 position.  A curvilinear capsulorrhexis was made with a cystotome and capsulorrhexis forceps.  Balanced salt solution was used to hydrodissect and hydrodelineate the nucleus.   Phacoemulsification was then used in stop and chop fashion to remove the lens nucleus and epinucleus.  The remaining cortex was then removed using the irrigation and aspiration handpiece. Healon was then placed into the capsular bag to distend it for lens placement.  A lens was then injected into the capsular bag.  The remaining viscoelastic was aspirated.   Wounds were  hydrated with balanced salt solution.  The anterior chamber was inflated to a physiologic pressure with balanced salt solution.  Intracameral vigamox 0.1 mL undiltued was injected into the eye and a drop placed onto the ocular surface.  No wound leaks were noted.  The patient was taken to the recovery room in stable condition without complications of anesthesia or surgery  Benay Pillow 10/31/2017, 8:42 AM

## 2017-10-31 NOTE — Anesthesia Preprocedure Evaluation (Signed)
Anesthesia Evaluation  Patient identified by MRN, date of birth, ID band Patient awake    Reviewed: Allergy & Precautions, H&P , NPO status , Patient's Chart, lab work & pertinent test results, reviewed documented beta blocker date and time   Airway Mallampati: II  TM Distance: >3 FB Neck ROM: full    Dental no notable dental hx. (+) Teeth Intact   Pulmonary neg pulmonary ROS, shortness of breath and with exertion, former smoker,    Pulmonary exam normal breath sounds clear to auscultation       Cardiovascular Exercise Tolerance: Good hypertension, negative cardio ROS   Rhythm:regular Rate:Normal     Neuro/Psych negative neurological ROS  negative psych ROS   GI/Hepatic negative GI ROS, Neg liver ROS,   Endo/Other  negative endocrine ROSdiabetes  Renal/GU      Musculoskeletal   Abdominal   Peds  Hematology negative hematology ROS (+)   Anesthesia Other Findings   Reproductive/Obstetrics negative OB ROS                             Anesthesia Physical Anesthesia Plan  ASA: III  Anesthesia Plan: MAC   Post-op Pain Management:    Induction:   PONV Risk Score and Plan:   Airway Management Planned:   Additional Equipment:   Intra-op Plan:   Post-operative Plan:   Informed Consent: I have reviewed the patients History and Physical, chart, labs and discussed the procedure including the risks, benefits and alternatives for the proposed anesthesia with the patient or authorized representative who has indicated his/her understanding and acceptance.     Plan Discussed with: CRNA  Anesthesia Plan Comments:         Anesthesia Quick Evaluation

## 2017-10-31 NOTE — Anesthesia Postprocedure Evaluation (Signed)
Anesthesia Post Note  Patient: Karen Henson  Procedure(s) Performed: CATARACT EXTRACTION PHACO AND INTRAOCULAR LENS PLACEMENT (IOC) (Left Eye)  Patient location during evaluation: Short Stay Anesthesia Type: MAC Level of consciousness: awake and alert, patient cooperative and oriented Pain management: satisfactory to patient Vital Signs Assessment: post-procedure vital signs reviewed and stable Respiratory status: spontaneous breathing and respiratory function stable Cardiovascular status: blood pressure returned to baseline Postop Assessment: no headache, no apparent nausea or vomiting and adequate PO intake Anesthetic complications: no     Last Vitals:  Vitals:   10/31/17 0707  BP: 135/86  Pulse: 62  Resp: 17  Temp: (!) 36.1 C  SpO2: 100%    Last Pain:  Vitals:   10/31/17 0707  TempSrc: Tympanic                 Eben Burow

## 2017-10-31 NOTE — Transfer of Care (Signed)
Immediate Anesthesia Transfer of Care Note  Patient: Karen Henson  Procedure(s) Performed: CATARACT EXTRACTION PHACO AND INTRAOCULAR LENS PLACEMENT (IOC) (Left Eye)  Patient Location: Short Stay  Anesthesia Type:MAC  Level of Consciousness: awake, alert , oriented and patient cooperative  Airway & Oxygen Therapy: Patient Spontanous Breathing  Post-op Assessment: Report given to RN and Post -op Vital signs reviewed and stable  Post vital signs: Reviewed and stable  Last Vitals:  Vitals:   10/31/17 0707  BP: 135/86  Pulse: 62  Resp: 17  Temp: (!) 36.1 C  SpO2: 100%    Last Pain:  Vitals:   10/31/17 0707  TempSrc: Tympanic         Complications: No apparent anesthesia complications

## 2017-10-31 NOTE — H&P (Signed)
The History and Physical notes are on paper, have been signed, and are to be scanned.   I have examined the patient and there are no changes to the H&P.   Benay Pillow 10/31/2017 8:11 AM

## 2017-10-31 NOTE — Anesthesia Post-op Follow-up Note (Signed)
Anesthesia QCDR form completed.        

## 2017-11-04 ENCOUNTER — Encounter: Payer: Self-pay | Admitting: Internal Medicine

## 2017-11-04 ENCOUNTER — Ambulatory Visit (INDEPENDENT_AMBULATORY_CARE_PROVIDER_SITE_OTHER): Payer: Medicare Other | Admitting: Internal Medicine

## 2017-11-04 VITALS — BP 139/92 | HR 59 | Ht 62.0 in | Wt 130.4 lb

## 2017-11-04 DIAGNOSIS — H25093 Other age-related incipient cataract, bilateral: Secondary | ICD-10-CM | POA: Diagnosis not present

## 2017-11-04 DIAGNOSIS — R059 Cough, unspecified: Secondary | ICD-10-CM

## 2017-11-04 DIAGNOSIS — R0602 Shortness of breath: Secondary | ICD-10-CM | POA: Insufficient documentation

## 2017-11-04 DIAGNOSIS — R05 Cough: Secondary | ICD-10-CM | POA: Diagnosis not present

## 2017-11-04 DIAGNOSIS — J84112 Idiopathic pulmonary fibrosis: Secondary | ICD-10-CM

## 2017-11-04 NOTE — Patient Instructions (Signed)
Pulmonary Fibrosis °Pulmonary fibrosis is a type of lung disease that causes scarring. Over time, the scar tissue (fibrosis) builds up in the air sacs of your lungs. This makes it hard for you to breathe. Less oxygen can get into your blood. °Scarring from pulmonary fibrosis gets worse over time. This damage is permanent. Having damaged lungs may make it more likely that you will have heart problems as well. °What are the causes? °Usually, the cause of pulmonary fibrosis is not known (idiopathic pulmonary fibrosis). However, pulmonary fibrosis can be caused by: °· Exposure to occupational and environmental toxins. These include asbestos, silica, and metal dusts. °· Inhaling moldy hay. This can cause an allergic reaction in the lung (farmer's lung) that can lead to pulmonary fibrosis. °· Inhaling toxic fumes. °· Certain medicines. These include drugs used in radiation therapy or used to treat seizures, heart problems, and some infections. °· Autoimmune diseases, such as rheumatoid arthritis, systemic sclerosis, and connective tissue diseases. °· Sarcoidosis. In this disease, areas of inflammatory cells (granulomas) form and most often affect the lungs. °· Genes. Some cases of pulmonary fibrosis may be passed down through families. ° °What increases the risk? °You may be at a higher risk for developing pulmonary fibrosis if: °· You have a family history of the disease. °· You have an autoimmune disease or another condition linked to pulmonary fibrosis. °· You are exposed to certain substances or fumes found in agricultural, farm, construction, or factory work. °· You take certain medicines. ° °What are the signs or symptoms? °Symptoms may include: °· Difficulty breathing that gets worse with activity. °· Dry, hacking cough. °· Rapid, shallow breathing during exercise or while at rest. °· Shortness of breath that gets worse (dyspnea). °· Bluish skin and lips. °· Loss of appetite. °· Loss of strength. °· Weight loss and  fatigue. °· Rounded and enlarged fingertips (clubbing). ° °How is this diagnosed? °Your health care provider may suspect pulmonary fibrosis based on your symptoms and medical history. Diagnosis may include a physical exam. Your health care provider will check for signs that strongly suggest that you have pulmonary fibrosis, such as: °· Blue skin around your fingernails or mouth from reduced oxygen. °· Clubbing around the ends of your fingers. °· A crackling sound when you breathe. ° °Your health care provider may also do tests to confirm the diagnosis. These may include: °· Looking inside your lungs with an instrument (bronchoscopy). °· Imaging studies of your lungs and heart using: °? X-rays. °? CT scan. °? Sound waves (echocardiogram). °· Tests to measure how well you are breathing (pulmonary function tests). °· Exercise testing to see how well your lungs work while you are walking. °· Blood tests. °· A procedure to remove a small piece of lung tissue to examine in a lab (biopsy). ° °How is this treated? °There is no cure for pulmonary fibrosis. Treatments focus on managing symptoms and preventing scarring from getting worse. This can include: °· Medicines. °? You may take steroids to prevent permanent lung changes. Your health care provider may put you on a high dose at first, then on lower dosages for the long term. °? Medicines to suppress your body’s defense (immune) system. These can have serious side effects. °· You may be monitored with X-rays and laboratory work. °· Oxygen therapy may be helpful if oxygen in your blood is low. °· Surgery. In some cases, a lung transplant is an option. ° °Follow these instructions at home: °· Take medicines only as   directed by your health care provider. °· Keep your vaccinations up to date as recommended by your health care provider. °· Do not use any tobacco products, including cigarettes, chewing tobacco, or electronic cigarettes. If you need help quitting, ask your  health care provider. °· Get regular exercise, but do not overexert yourself. Ask your health care provider to suggest some activities that are safe for you to do. Walking and chair exercises can help if you have physical limitations. °· Consider joining a pulmonary rehabilitation program or a support group for people with pulmonary fibrosis. °· Eat small meals often so you do not get too full. Overeating can make breathing trouble worse. °· Maintain a healthy weight. Lose weight if you need to. °· Do breathing exercises as directed by your health care provider. °· Keep all follow-up visits as directed by your health care provider. This is important. °Contact a health care provider if: °· You are not able to be as active as usual. °· You have a long-lasting (chronic) cough. °· You are often short of breath. °· You have a fever or chills. °Get help right away if: °· You have chest pain. °· Your breathing is much worse. °· You cannot take a deep breath. °· You have blue skin around your mouth or fingers. °· You have clubbing of your fingers. °· You cough up mucus that is dark in color. °· You have a lot of headaches. °· You get very confused or sleepy. °This information is not intended to replace advice given to you by your health care provider. Make sure you discuss any questions you have with your health care provider. °Document Released: 12/08/2003 Document Revised: 02/23/2016 Document Reviewed: 02/25/2014 °Elsevier Interactive Patient Education © 2018 [employer] Inc. ° °

## 2017-11-04 NOTE — Progress Notes (Signed)
[facility] [place], [place] [address]  Pulmonary Sleep Medicine  Office Visit Note  Patient Name: Karen Henson DOB: [birth year] MRN [id number]  Date of Service: 11/04/2017  Complaints/HPI: She is doing well overall. She had cataract surgery done and this was with no issues. She is seeing better. Her breathing is better. She little dry cough noted. She has no chest pain noted. She denies having fevers or chills noted. No congestion at this time  ROS  General: (-) fever, (-) chills, (-) night sweats, (-) weakness Skin: (-) rashes, (-) itching,. Eyes: (-) visual changes, (-) redness, (-) itching. Nose and Sinuses: (-) nasal stuffiness or itchiness, (-) postnasal drip, (-) nosebleeds, (-) sinus trouble. Mouth and Throat: (-) sore throat, (-) hoarseness. Neck: (-) swollen glands, (-) enlarged thyroid, (-) neck pain. Respiratory: + cough, (-) bloody sputum, + shortness of breath, - wheezing. Cardiovascular: - ankle swelling, (-) chest pain. Lymphatic: (-) lymph node enlargement. Neurologic: (-) numbness, (-) tingling. Psychiatric: (-) anxiety, (-) depression   Current Medication: Outpatient Encounter Medications as of 11/04/2017  Medication Sig  . acetaminophen (TYLENOL) 500 MG tablet Take 500 mg by mouth every 6 (six) hours as needed for moderate pain or headache.  Marland Kitchen aspirin EC 81 MG tablet Take 81 mg by mouth daily.  . budesonide-formoterol (SYMBICORT) 80-4.5 MCG/ACT inhaler Inhale 2 puffs into the lungs 2 (two) times daily. (Patient taking differently: Inhale 2 puffs into the lungs 2 (two) times daily as needed (for shortness of breath or wheezing). )  . dextromethorphan-guaiFENesin (MUCINEX DM) 30-600 MG 12hr tablet Take 1 tablet by mouth daily.  Marland Kitchen loratadine (CLARITIN) 10 MG tablet Take 10 mg by mouth daily.   Marland Kitchen losartan-hydrochlorothiazide (HYZAAR) 50-12.5 MG tablet Take 1 tablet daily by mouth.  . montelukast (SINGULAIR) 10 MG tablet Take 10 mg by  mouth at bedtime.   . chlorpheniramine-HYDROcodone (TUSSIONEX PENNKINETIC ER) 10-8 MG/5ML SUER Take 5 mLs by mouth every 12 (twelve) hours as needed for cough. (Patient not taking: Reported on 11/04/2017)  . clobetasol ointment (TEMOVATE) 2.50 % Apply 1 application topically 2 (two) times daily. (Patient not taking: Reported on 07/23/2017)   No facility-administered encounter medications on file as of 11/04/2017.     Surgical History: Past Surgical History:  Procedure Laterality Date  . BREAST BIOPSY Left 1996   neg  . BREAST LUMPECTOMY Left   . CATARACT EXTRACTION W/PHACO Left 10/31/2017   Procedure: CATARACT EXTRACTION PHACO AND INTRAOCULAR LENS PLACEMENT (IOC);  Surgeon: Eulogio Bear, MD;  Location: [facility] ORS;  Service: Ophthalmology;  Laterality: Left;  [place] 00:43.7 AP% 19.6 CDE 8.57 FLUID PACK LOT # [id number]  . COLONOSCOPY  02/15/2006  . COLONOSCOPY WITH PROPOFOL N/A 03/21/2016   Procedure: COLONOSCOPY WITH PROPOFOL;  Surgeon: Robert Bellow, MD;  Location: [facility] ENDOSCOPY;  Service: Endoscopy;  Laterality: N/A;  . DILATION AND CURETTAGE OF UTERUS    . VARICOSE VEIN SURGERY      Medical History: Past Medical History:  Diagnosis Date  . Allergy    Environmental Allergies  . Arthritis   . Cough    CHRONIC  . Dyspnea    OCCAS  . Hypertension   . 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 Osteoporosis     Family History: Family History  Problem Relation Age of Onset  . Hypertension Mother   . Emphysema Father   . Stroke Sister   . Stroke Brother   . Diabetes Maternal Aunt   . Diabetes Maternal  Grandmother   . Lung cancer Grandchild   . Breast cancer Neg Hx   . Ovarian cancer Neg Hx   . Colon cancer Neg Hx     Social History: Social History   Socioeconomic History  . Marital status: Widowed    Spouse name: Not on file  . Number of children: 2  . Years of education: Not on file  . Highest education level: Not on file  Social Needs  . Financial resource strain: Not on file   . Food insecurity - worry: Not on file  . Food insecurity - inability: Not on file  . Transportation needs - medical: Not on file  . Transportation needs - non-medical: Not on file  Occupational History  . Occupation: retired  Tobacco Use  . Smoking status: Former Smoker    Packs/day: 0.25    Types: Cigarettes  . Smokeless tobacco: Never Used  . Tobacco comment: smoked a pack a week, quit about 30 years ago  Substance and Sexual Activity  . Alcohol use: Yes    Comment: ocassional- monthly  . Drug use: No  . Sexual activity: No  Other Topics Concern  . Not on file  Social History Narrative  . Not on file    Vital Signs: Blood pressure (!) 139/92, pulse (!) 59, height 5\' 2"  (1.575 m), weight 130 lb 6.4 oz (59.1 kg), SpO2 96 %.  Examination: General Appearance: The patient is well-developed, well-nourished, and in no distress. Skin: Gross inspection of skin unremarkable. Head: normocephalic, no gross deformities. Eyes: no gross deformities noted. ENT: ears appear grossly normal no exudates. Neck: Supple. No thyromegaly. No LAD. Respiratory: rales dry type noted at baseline. Cardiovascular: Normal S1 and S2 without murmur or rub. Extremities: No cyanosis. pulses are equal. Neurologic: Alert and oriented. No involuntary movements.  LABS: No results found for this or any previous visit (from the past 2160 hour(s)).  Radiology: No results found.  No results found.  No results found.    Assessment and Plan: Patient Active Problem List   Diagnosis Date Noted  . Shortness of breath 11/04/2017  . Chronic cough 08/05/2017  . 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 History of adenomatous polyp of colon 05/30/2016  . Allergic rhinitis 11/09/2015  . 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 BP (high blood pressure) 11/09/2015  . 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 OP (osteoporosis) 11/09/2015    1. Pulmonary  fibrosis doing better now with last treatment. She completed her levaquin and also the prednisone which she states she did not take on the last visit. She will monitor response 2. Cough she did not get the tussionex filled as it was not covered. Now at baseline 3. Cataracts one done and she is going to get the other  General Counseling: I have discussed the findings of the evaluation and examination with Machele.  I have also discussed any further diagnostic evaluation thatmay be needed or ordered today. Jeffery verbalizes understanding of the findings of todays visit. We also reviewed her medications today and discussed drug interactions and side effects including but not limited excessive drowsiness and altered mental states. We also discussed that there is always a risk not just to her but also people around her. she has been encouraged to call the office with any questions or concerns that should arise related to todays visit.    Time spent: 79min  I have personally obtained a history, examined the patient,  evaluated laboratory and imaging results, formulated the assessment and plan and placed orders.    Allyne Gee, MD [facility] Pulmonary and Critical Care Sleep medicine

## 2017-11-07 ENCOUNTER — Ambulatory Visit: Payer: Self-pay | Admitting: Internal Medicine

## 2017-11-22 DIAGNOSIS — H2511 Age-related nuclear cataract, right eye: Secondary | ICD-10-CM | POA: Diagnosis not present

## 2017-11-27 ENCOUNTER — Encounter: Payer: Self-pay | Admitting: *Deleted

## 2017-12-05 ENCOUNTER — Ambulatory Visit
Admission: RE | Admit: 2017-12-05 | Discharge: 2017-12-05 | Disposition: A | Discharge status: Home / Self Care | Payer: Medicare Other | Source: Ambulatory Visit | Attending: Ophthalmology | Admitting: Ophthalmology

## 2017-12-05 ENCOUNTER — Ambulatory Visit: Payer: Medicare Other | Admitting: Anesthesiology

## 2017-12-05 ENCOUNTER — Encounter
Admission: RE | Disposition: A | Discharge status: Home / Self Care | Payer: Self-pay | Source: Ambulatory Visit | Attending: Ophthalmology

## 2017-12-05 DIAGNOSIS — Z7982 Long term (current) use of aspirin: Secondary | ICD-10-CM | POA: Diagnosis not present

## 2017-12-05 DIAGNOSIS — M810 Age-related osteoporosis without current pathological fracture: Secondary | ICD-10-CM | POA: Insufficient documentation

## 2017-12-05 DIAGNOSIS — J8410 Pulmonary fibrosis, unspecified: Secondary | ICD-10-CM | POA: Insufficient documentation

## 2017-12-05 DIAGNOSIS — Z87891 Personal history of nicotine dependence: Secondary | ICD-10-CM | POA: Insufficient documentation

## 2017-12-05 DIAGNOSIS — H2511 Age-related nuclear cataract, right eye: Secondary | ICD-10-CM | POA: Diagnosis not present

## 2017-12-05 DIAGNOSIS — E119 Type 2 diabetes mellitus without complications: Secondary | ICD-10-CM | POA: Insufficient documentation

## 2017-12-05 DIAGNOSIS — I10 Essential (primary) hypertension: Secondary | ICD-10-CM | POA: Diagnosis not present

## 2017-12-05 DIAGNOSIS — J309 Allergic rhinitis, unspecified: Secondary | ICD-10-CM | POA: Diagnosis not present

## 2017-12-05 DIAGNOSIS — Z79899 Other long term (current) drug therapy: Secondary | ICD-10-CM | POA: Diagnosis not present

## 2017-12-05 HISTORY — PX: CATARACT EXTRACTION W/PHACO: SHX586

## 2017-12-05 HISTORY — DX: Unspecified chronic bronchitis: J42

## 2017-12-05 SURGERY — PHACOEMULSIFICATION, CATARACT, WITH IOL INSERTION
Anesthesia: Monitor Anesthesia Care | Laterality: Right

## 2017-12-05 MED ORDER — MIDAZOLAM HCL 2 MG/2ML IJ SOLN
INTRAMUSCULAR | Status: DC | PRN
  Administered 2017-12-05: 1 mg via INTRAVENOUS

## 2017-12-05 MED ORDER — POVIDONE-IODINE 5 % OP SOLN
OPHTHALMIC | Status: AC
  Filled 2017-12-05: qty 30

## 2017-12-05 MED ORDER — SODIUM HYALURONATE 23 MG/ML IO SOLN
INTRAOCULAR | Status: DC | PRN
  Administered 2017-12-05: 0.6 mL via INTRAOCULAR

## 2017-12-05 MED ORDER — LIDOCAINE HCL (PF) 4 % IJ SOLN
INTRAMUSCULAR | Status: AC
  Filled 2017-12-05: qty 5

## 2017-12-05 MED ORDER — MOXIFLOXACIN HCL 0.5 % OP SOLN
OPHTHALMIC | Status: AC
  Filled 2017-12-05: qty 3

## 2017-12-05 MED ORDER — POVIDONE-IODINE 5 % OP SOLN
OPHTHALMIC | Status: DC | PRN
Start: 2017-12-05 — End: 2017-12-05
  Administered 2017-12-05: 1 via OPHTHALMIC

## 2017-12-05 MED ORDER — SODIUM HYALURONATE 23 MG/ML IO SOLN
INTRAOCULAR | Status: AC
  Filled 2017-12-05: qty 0.6

## 2017-12-05 MED ORDER — ARMC OPHTHALMIC DILATING DROPS
OPHTHALMIC | Status: AC
  Administered 2017-12-05: 1 via OPHTHALMIC
  Filled 2017-12-05: qty 0.4

## 2017-12-05 MED ORDER — SODIUM CHLORIDE 0.9 % IV SOLN
INTRAVENOUS | Status: DC
  Administered 2017-12-05: 09:00:00 via INTRAVENOUS

## 2017-12-05 MED ORDER — MIDAZOLAM HCL 2 MG/2ML IJ SOLN
INTRAMUSCULAR | Status: AC
  Filled 2017-12-05: qty 2

## 2017-12-05 MED ORDER — FENTANYL CITRATE (PF) 100 MCG/2ML IJ SOLN
INTRAMUSCULAR | Status: AC
  Filled 2017-12-05: qty 2

## 2017-12-05 MED ORDER — FENTANYL CITRATE (PF) 100 MCG/2ML IJ SOLN
INTRAMUSCULAR | Status: DC | PRN
  Administered 2017-12-05: 50 ug via INTRAVENOUS

## 2017-12-05 MED ORDER — EPINEPHRINE PF 1 MG/ML IJ SOLN
INTRAMUSCULAR | Status: AC
  Filled 2017-12-05: qty 1

## 2017-12-05 MED ORDER — SODIUM HYALURONATE 10 MG/ML IO SOLN
INTRAOCULAR | Status: DC | PRN
  Administered 2017-12-05: 0.55 mL via INTRAOCULAR

## 2017-12-05 MED ORDER — BSS IO SOLN
INTRAOCULAR | Status: DC | PRN
  Administered 2017-12-05: 1 via INTRAOCULAR

## 2017-12-05 MED ORDER — LIDOCAINE HCL (PF) 4 % IJ SOLN
INTRAMUSCULAR | Status: DC | PRN
  Administered 2017-12-05: .2 mL via OPHTHALMIC

## 2017-12-05 MED ORDER — MOXIFLOXACIN HCL 0.5 % OP SOLN: 1.0000 [drp] | OPHTHALMIC | Status: DC | PRN

## 2017-12-05 MED ORDER — MOXIFLOXACIN HCL 0.5 % OP SOLN
OPHTHALMIC | Status: DC | PRN
  Administered 2017-12-05: 0.2 mL via OPHTHALMIC

## 2017-12-05 MED ORDER — ARMC OPHTHALMIC DILATING DROPS
1.0000 "application " | OPHTHALMIC | Status: AC
  Administered 2017-12-05 (×2): 1 via OPHTHALMIC

## 2017-12-05 SURGICAL SUPPLY — 18 items
DISSECTOR HYDRO NUCLEUS 50X22 (MISCELLANEOUS) ×3 IMPLANT
GLOVE BIO SURGEON STRL SZ8 (GLOVE) ×3 IMPLANT
GLOVE BIOGEL M 6.5 STRL (GLOVE) ×3 IMPLANT
GLOVE SURG LX 7.5 STRW (GLOVE) ×2
GLOVE SURG LX STRL 7.5 STRW (GLOVE) ×1 IMPLANT
GOWN STRL REUS W/ TWL LRG LVL3 (GOWN DISPOSABLE) ×2 IMPLANT
GOWN STRL REUS W/TWL LRG LVL3 (GOWN DISPOSABLE) ×6
LABEL CATARACT MEDS ST (LABEL) ×3 IMPLANT
LENS IOL TECNIS ITEC 23.5 (Intraocular Lens) ×2 IMPLANT
PACK CATARACT (MISCELLANEOUS) ×3 IMPLANT
PACK CATARACT KING (MISCELLANEOUS) ×3 IMPLANT
PACK EYE AFTER SURG (MISCELLANEOUS) ×3 IMPLANT
SOL BAL SALT 15ML (MISCELLANEOUS) ×3
SOL BSS BAG (MISCELLANEOUS) ×3
SOLUTION BAL SALT 15ML (MISCELLANEOUS) IMPLANT
SOLUTION BSS BAG (MISCELLANEOUS) ×1 IMPLANT
WATER STERILE IRR 250ML POUR (IV SOLUTION) ×3 IMPLANT
WIPE NON LINTING 3.25X3.25 (MISCELLANEOUS) ×3 IMPLANT

## 2017-12-05 NOTE — Discharge Instructions (Signed)
Eye Surgery Discharge Instructions  Expect mild scratchy sensation or mild soreness. DO NOT RUB YOUR EYE!  The day of surgery:  Minimal physical activity, but bed rest is not required  No reading, computer work, or close hand work  No bending, lifting, or straining.  May watch TV  For 24 hours:  No driving, legal decisions, or alcoholic beverages  Safety precautions  Eat anything you prefer: It is better to start with liquids, then soup then solid foods.  _____ Eye patch should be worn until postoperative exam tomorrow.  ____ Solar shield eyeglasses should be worn for comfort in the sunlight/patch while sleeping  Resume all regular medications including aspirin or Coumadin if these were discontinued prior to surgery. You may shower, bathe, shave, or wash your hair. Tylenol may be taken for mild discomfort.  Call your doctor if you experience significant pain, nausea, or vomiting, fever > 101 or other signs of infection. [phone] or [phone] Specific instructions:  Follow-up Information    Eulogio Bear, MD Follow up.   Specialty:  Ophthalmology Why:  March 8 at 10:45am Contact information: [place]  [id number] [phone]

## 2017-12-05 NOTE — Anesthesia Postprocedure Evaluation (Signed)
Anesthesia Post Note  Patient: Karen Henson  Procedure(s) Performed: CATARACT EXTRACTION PHACO AND INTRAOCULAR LENS PLACEMENT (IOC) (Right )  Patient location during evaluation: PACU Anesthesia Type: MAC Level of consciousness: awake, awake and alert and oriented Pain management: pain level controlled Vital Signs Assessment: post-procedure vital signs reviewed and stable Respiratory status: spontaneous breathing Cardiovascular status: blood pressure returned to baseline Postop Assessment: no headache Anesthetic complications: no     Last Vitals:  Vitals:   12/05/17 0817 12/05/17 1008  BP: 130/87 115/87  Pulse: 64 74  Resp: 16 16  Temp: (!) 36.1 C   SpO2: 99% 97%    Last Pain:  Vitals:   12/05/17 0817  TempSrc: Oral                 Philbert Riser

## 2017-12-05 NOTE — Anesthesia Postprocedure Evaluation (Signed)
Anesthesia Post Note  Patient: Karen Henson  Procedure(s) Performed: CATARACT EXTRACTION PHACO AND INTRAOCULAR LENS PLACEMENT (IOC) (Right )  Patient location during evaluation: PACU Anesthesia Type: MAC Level of consciousness: awake and alert Pain management: pain level controlled Vital Signs Assessment: post-procedure vital signs reviewed and stable Respiratory status: spontaneous breathing, nonlabored ventilation, respiratory function stable and patient connected to nasal cannula oxygen Cardiovascular status: stable and blood pressure returned to baseline Postop Assessment: no apparent nausea or vomiting Anesthetic complications: no     Last Vitals:  Vitals:   12/05/17 1008 12/05/17 1017  BP: 115/87 119/79  Pulse: 74 67  Resp: 16 16  Temp: 36.6 C   SpO2: 97% 98%    Last Pain:  Vitals:   12/05/17 0817  TempSrc: Oral                 Precious Haws Corah Willeford

## 2017-12-05 NOTE — Op Note (Signed)
OPERATIVE NOTE  Karen Henson [id number] 12/05/2017   PREOPERATIVE DIAGNOSIS:  Nuclear sclerotic cataract right eye.  H25.11   POSTOPERATIVE DIAGNOSIS:    Nuclear sclerotic cataract right eye.     PROCEDURE:  Phacoemusification with posterior chamber intraocular lens placement of the right eye   LENS:   Implant Name Type Inv. Item Serial No. Manufacturer Lot No. LRB No. Used  LENS IOL DIOP 23.5 - [id number] Intraocular Lens LENS IOL DIOP 23.5 [id number] AMO  Right 1       PCB00 +23.5   ULTRASOUND TIME: 0 minutes 31 seconds.  CDE 2.79   SURGEON:  Benay Pillow, MD, MPH  ANESTHESIOLOGIST: Anesthesiologist: Piscitello, Precious Haws, MD CRNA: Philbert Riser, CRNA   ANESTHESIA:  Topical with tetracaine drops augmented with 1% preservative-free intracameral lidocaine.  ESTIMATED BLOOD LOSS: less than 1 mL.   COMPLICATIONS:  None.   DESCRIPTION OF PROCEDURE:  The patient was identified in the holding room and transported to the operating room and placed in the supine position under the operating microscope.  The right eye was identified as the operative eye and it was prepped and draped in the usual sterile ophthalmic fashion.   A 1.0 millimeter clear-corneal paracentesis was made at the 10:30 position. 0.5 ml of preservative-free 1% lidocaine with epinephrine was injected into the anterior chamber.  The anterior chamber was filled with Healon 5 viscoelastic.  A 2.4 millimeter keratome was used to make a near-clear corneal incision at the 8:00 position.  A curvilinear capsulorrhexis was made with a cystotome and capsulorrhexis forceps.  Balanced salt solution was used to hydrodissect and hydrodelineate the nucleus.   Phacoemulsification was then used in stop and chop fashion to remove the lens nucleus and epinucleus.  The remaining cortex was then removed using the irrigation and aspiration handpiece. Healon was then placed into the capsular bag to distend it for lens placement.  A lens  was then injected into the capsular bag.  The remaining viscoelastic was aspirated.   Wounds were hydrated with balanced salt solution.  The anterior chamber was inflated to a physiologic pressure with balanced salt solution.   Intracameral vigamox 0.1 mL undiluted was injected into the eye and a drop placed onto the ocular surface.  No wound leaks were noted.  The patient was taken to the recovery room in stable condition without complications of anesthesia or surgery  Benay Pillow 12/05/2017, 10:03 AM

## 2017-12-05 NOTE — H&P (Signed)
The History and Physical notes are on paper, have been signed, and are to be scanned.   I have examined the patient and there are no changes to the H&P.   Benay Pillow 12/05/2017 9:17 AM

## 2017-12-05 NOTE — Transfer of Care (Signed)
Immediate Anesthesia Transfer of Care Note  Patient: Karen Henson  Procedure(s) Performed: CATARACT EXTRACTION PHACO AND INTRAOCULAR LENS PLACEMENT (IOC) (Right )  Patient Location: PACU  Anesthesia Type:MAC  Level of Consciousness: awake, alert  and oriented  Airway & Oxygen Therapy: Patient Spontanous Breathing  Post-op Assessment: Report given to RN and Post -op Vital signs reviewed and stable  Post vital signs: Reviewed and stable  Last Vitals:  Vitals:   12/05/17 0817  BP: 130/87  Pulse: 64  Resp: 16  Temp: (!) 36.1 C  SpO2: 99%    Last Pain:  Vitals:   12/05/17 0817  TempSrc: Oral         Complications: No apparent anesthesia complications

## 2017-12-05 NOTE — Anesthesia Post-op Follow-up Note (Signed)
Anesthesia QCDR form completed.        

## 2017-12-05 NOTE — OR Nursing (Signed)
Discharge instructions discussed with pt and family. Both voice understanding. 

## 2017-12-05 NOTE — Anesthesia Preprocedure Evaluation (Signed)
Anesthesia Evaluation  Patient identified by MRN, date of birth, ID band Patient awake    Reviewed: Allergy & Precautions, H&P , NPO status , Patient's Chart, lab work & pertinent test results, reviewed documented beta blocker date and time   Airway Mallampati: II  TM Distance: >3 FB Neck ROM: full    Dental no notable dental hx. (+) Poor Dentition, Chipped, Caps   Pulmonary shortness of breath and with exertion, former smoker,    Pulmonary exam normal breath sounds clear to auscultation       Cardiovascular Exercise Tolerance: Good hypertension,  Rhythm:regular Rate:Normal     Neuro/Psych negative neurological ROS  negative psych ROS   GI/Hepatic negative GI ROS, Neg liver ROS,   Endo/Other  diabetes, Type 2  Renal/GU      Musculoskeletal  (+) Arthritis ,   Abdominal   Peds  Hematology negative hematology ROS (+)   Anesthesia Other Findings Past Medical History: No date: Allergy     Comment:  Environmental Allergies No date: Arthritis No date: Bronchitis, chronic (HCC) No date: Cough     Comment:  CHRONIC No date: Dyspnea     Comment:  OCCAS No date: Hypertension No date: 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HCC) No date: Osteoporosis   Reproductive/Obstetrics negative OB ROS                             Anesthesia Physical  Anesthesia Plan  ASA: III  Anesthesia Plan: MAC   Post-op Pain Management:    Induction: Intravenous  PONV Risk Score and Plan:   Airway Management Planned: Natural Airway and Nasal Cannula  Additional Equipment:   Intra-op Plan:   Post-operative Plan:   Informed Consent: I have reviewed the patients History and Physical, chart, labs and discussed the procedure including the risks, benefits and alternatives for the proposed anesthesia with the patient or authorized representative who has indicated his/her understanding and acceptance.   Dental Advisory  Given  Plan Discussed with: CRNA  Anesthesia Plan Comments: (Patient consented for risks of anesthesia including but not limited to:  - adverse reactions to medications - damage to teeth, lips or other oral mucosa - sore throat or hoarseness - Damage to heart, brain, lungs or loss of life  Patient voiced understanding.)        Anesthesia Quick Evaluation

## 2017-12-13 ENCOUNTER — Other Ambulatory Visit: Payer: Self-pay | Admitting: Internal Medicine

## 2018-01-27 ENCOUNTER — Encounter: Payer: Self-pay | Admitting: Internal Medicine

## 2018-01-27 ENCOUNTER — Ambulatory Visit (INDEPENDENT_AMBULATORY_CARE_PROVIDER_SITE_OTHER): Payer: Medicare Other | Admitting: Internal Medicine

## 2018-01-27 VITALS — BP 124/96 | HR 68 | Temp 97.7°F | Resp 16 | Ht 62.0 in | Wt 126.0 lb

## 2018-01-27 DIAGNOSIS — J84112 Idiopathic pulmonary fibrosis: Secondary | ICD-10-CM

## 2018-01-27 DIAGNOSIS — J440 Chronic obstructive pulmonary disease with acute lower respiratory infection: Secondary | ICD-10-CM

## 2018-01-27 DIAGNOSIS — J449 Chronic obstructive pulmonary disease, unspecified: Secondary | ICD-10-CM

## 2018-01-27 DIAGNOSIS — J301 Allergic rhinitis due to pollen: Secondary | ICD-10-CM | POA: Diagnosis not present

## 2018-01-27 DIAGNOSIS — J209 Acute bronchitis, unspecified: Secondary | ICD-10-CM

## 2018-01-27 MED ORDER — PREDNISONE 10 MG (21) PO TBPK: ORAL_TABLET | ORAL | 0 refills | Status: DC

## 2018-01-27 MED ORDER — LEVOFLOXACIN 500 MG PO TABS: 500.0000 mg | ORAL_TABLET | Freq: Every day | ORAL | 0 refills | Status: DC

## 2018-01-27 NOTE — Patient Instructions (Signed)

## 2018-01-27 NOTE — Progress Notes (Signed)
[facility] [place], [place] [address]  Pulmonary Sleep Medicine   Office Visit Note  Patient Name: Karen Henson DOB: 1941-07-11 MRN [id number]  Date of Service: 01/27/2018  Complaints/HPI:  Patient states that she has been having a cough and congestion.  She has been sick for about week.  She is at increased sputum production change in color.  She has not had any hemoptysis.  Patient states that she was exposed to some grandchildren who were ill.  She denies having any chest pains she has had feeling feverish but no recorded fever.  Denies having any hemoptysis good has no increase over mode.  She has baseline shortness of breath noted.  ROS  General: (-) fever, (-) chills, (-) night sweats, (-) weakness Skin: (-) rashes, (-) itching,. Eyes: (-) visual changes, (-) redness, (-) itching. Nose and Sinuses: (-) nasal stuffiness or itchiness, (-) postnasal drip, (-) nosebleeds, (-) sinus trouble. Mouth and Throat: (-) sore throat, (-) hoarseness. Neck: (-) swollen glands, (-) enlarged thyroid, (-) neck pain. Respiratory: + cough, (-) bloody sputum, + shortness of breath, + wheezing. Cardiovascular: - ankle swelling, (-) chest pain. Lymphatic: (-) lymph node enlargement. Neurologic: (-) numbness, (-) tingling. Psychiatric: (-) anxiety, (-) depression   Current Medication: Outpatient Encounter Medications as of 01/27/2018  Medication Sig  . acetaminophen (TYLENOL) 500 MG tablet Take 500 mg by mouth every 6 (six) hours as needed for moderate pain or headache.  . budesonide-formoterol (SYMBICORT) 80-4.5 MCG/ACT inhaler Inhale 2 puffs into the lungs 2 (two) times daily. (Patient taking differently: Inhale 2 puffs into the lungs 2 (two) times daily as needed (for shortness of breath or wheezing). )  . dextromethorphan-guaiFENesin (MUCINEX DM) 30-600 MG 12hr tablet Take 1 tablet by mouth daily.  Marland Kitchen loratadine (CLARITIN) 10 MG tablet Take 10 mg by mouth daily.   Marland Kitchen  losartan-hydrochlorothiazide (HYZAAR) 50-12.5 MG tablet Take 1 tablet daily by mouth.  . montelukast (SINGULAIR) 10 MG tablet TAKE 1 TABLET BY MOUTH ONCE DAILY AT NIGHT FOR ASTHMA  . aspirin EC 81 MG tablet Take 81 mg by mouth daily.  . chlorpheniramine-HYDROcodone (TUSSIONEX PENNKINETIC ER) 10-8 MG/5ML SUER Take 5 mLs by mouth every 12 (twelve) hours as needed for cough. (Patient not taking: Reported on 11/04/2017)  . clobetasol ointment (TEMOVATE) 0.63 % Apply 1 application topically 2 (two) times daily. (Patient not taking: Reported on 07/23/2017)   No facility-administered encounter medications on file as of 01/27/2018.     Surgical History: Past Surgical History:  Procedure Laterality Date  . BREAST BIOPSY Left 1996   neg  . BREAST LUMPECTOMY Left   . CATARACT EXTRACTION W/PHACO Left 10/31/2017   Procedure: CATARACT EXTRACTION PHACO AND INTRAOCULAR LENS PLACEMENT (IOC);  Surgeon: Eulogio Bear, MD;  Location: [facility] ORS;  Service: Ophthalmology;  Laterality: Left;  [place] 00:43.7 AP% 19.6 CDE 8.57 FLUID PACK LOT # [id number]  . CATARACT EXTRACTION W/PHACO Right 12/05/2017   Procedure: CATARACT EXTRACTION PHACO AND INTRAOCULAR LENS PLACEMENT (IOC);  Surgeon: Eulogio Bear, MD;  Location: [facility] ORS;  Service: Ophthalmology;  Laterality: Right;  fluid pack lot #[id number]  exp 07/01/2019 [place]    00:31.4 AP%    8.8 CDE    2.79  . COLONOSCOPY  02/15/2006  . COLONOSCOPY WITH PROPOFOL N/A 03/21/2016   Procedure: COLONOSCOPY WITH PROPOFOL;  Surgeon: Robert Bellow, MD;  Location: [facility] ENDOSCOPY;  Service: Endoscopy;  Laterality: N/A;  . DILATION AND CURETTAGE OF UTERUS    .  VARICOSE VEIN SURGERY      Medical History: Past Medical History:  Diagnosis Date  . Allergy    Environmental Allergies  . Arthritis   . Bronchitis, chronic ([place])   . Cough    CHRONIC  . Dyspnea    OCCAS  . Hypertension   . 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[place])   . Osteoporosis     Family History: Family History  Problem  Relation Age of Onset  . Hypertension Mother   . Emphysema Father   . Stroke Sister   . Stroke Brother   . Diabetes Maternal Aunt   . Diabetes Maternal Grandmother   . Lung cancer Grandchild   . Breast cancer Neg Hx   . Ovarian cancer Neg Hx   . Colon cancer Neg Hx     Social History: Social History   Socioeconomic History  . Marital status: Widowed    Spouse name: Not on file  . Number of children: 2  . Years of education: Not on file  . Highest education level: Not on file  Occupational History  . Occupation: retired  Scientific laboratory technician  . Financial resource strain: Not on file  . Food insecurity:    Worry: Not on file    Inability: Not on file  . Transportation needs:    Medical: Not on file    Non-medical: Not on file  Tobacco Use  . Smoking status: Former Smoker    Packs/day: 0.25    Types: Cigarettes  . Smokeless tobacco: Never Used  . Tobacco comment: smoked a pack a week, quit about 30 years ago  Substance and Sexual Activity  . Alcohol use: Yes    Comment: ocassional- monthly  . Drug use: No  . Sexual activity: Never  Lifestyle  . Physical activity:    Days per week: 2 days    Minutes per session: 30 min  . Stress: Not on file  Relationships  . Social connections:    Talks on phone: Not on file    Gets together: Not on file    Attends religious service: Not on file    Active member of club or organization: Not on file    Attends meetings of clubs or organizations: Not on file    Relationship status: Not on file  . Intimate partner violence:    Fear of current or ex partner: Not on file    Emotionally abused: Not on file    Physically abused: Not on file    Forced sexual activity: Not on file  Other Topics Concern  . Not on file  Social History Narrative  . Not on file    Vital Signs: Blood pressure (!) 124/96, pulse 68, temperature 97.7 F (36.5 C), temperature source Oral, resp. rate 16, height 5\' 2"  (1.575 m), weight 126 lb (57.2 kg), SpO2 95  %.  Examination: General Appearance: The patient is well-developed, well-nourished, and in no distress. Skin: Gross inspection of skin unremarkable. Head: normocephalic, no gross deformities. Eyes: no gross deformities noted. ENT: ears appear grossly normal no exudates. Neck: Supple. No thyromegaly. No LAD. Respiratory: scattered rhonchi. Cardiovascular: Normal S1 and S2 without murmur or rub. Extremities: No cyanosis. pulses are equal. Neurologic: Alert and oriented. No involuntary movements.  LABS: No results found for this or any previous visit (from the past 2160 hour(s)).  Radiology: No results found.  No results found.  No results found.    Assessment and Plan: Patient Active Problem List   Diagnosis Date Noted  .  Shortness of breath 11/04/2017  . Chronic cough 08/05/2017  . 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 History of adenomatous polyp of colon 05/30/2016  . Allergic rhinitis 11/09/2015  . 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 BP (high blood pressure) 11/09/2015  . Fibrosis lung ([place]) 11/09/2015  . 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 OP (osteoporosis) 11/09/2015    1. COPD  Patient has significant COPD with mild exacerbation at this time.  We will give her Levaquin and prednisone to prevent any deterioration of her status.  Hold off on getting an x-ray for now.  If she is not improved within a week and she is going to come back and see me in we can get a film at that time.  Right now I do not believe that she has got a pneumonia clinically 2. Pulmonary fibrosis patient has advanced fibrotic disease radiologically will continue with supportive care 1 of the reasons do more aggressive management with antibiotics as well as steroids. 3. Acute bronchitis  As noted above we will treat with acute antibiotics 4. Allergic rhinitis stable at this time continue to follow  General Counseling: I have discussed the findings of the evaluation and  examination with Robynn.  I have also discussed any further diagnostic evaluation thatmay be needed or ordered today. Chyane verbalizes understanding of the findings of todays visit. We also reviewed her medications today and discussed drug interactions and side effects including but not limited excessive drowsiness and altered mental states. We also discussed that there is always a risk not just to her but also people around her. she has been encouraged to call the office with any questions or concerns that should arise related to todays visit.    Time spent: 70min  I have personally obtained a history, examined the patient, evaluated laboratory and imaging results, formulated the assessment and plan and placed orders.    Allyne Gee, MD [facility] Pulmonary and Critical Care Sleep medicine

## 2018-02-03 ENCOUNTER — Ambulatory Visit (INDEPENDENT_AMBULATORY_CARE_PROVIDER_SITE_OTHER): Payer: Medicare Other | Admitting: Family Medicine

## 2018-02-03 ENCOUNTER — Encounter: Payer: Self-pay | Admitting: Family Medicine

## 2018-02-03 VITALS — BP 124/80 | HR 70 | Temp 98.1°F | Resp 16 | Wt 127.0 lb

## 2018-02-03 DIAGNOSIS — R053 Chronic cough: Secondary | ICD-10-CM

## 2018-02-03 DIAGNOSIS — R05 Cough: Secondary | ICD-10-CM

## 2018-02-03 DIAGNOSIS — I10 Essential (primary) hypertension: Secondary | ICD-10-CM

## 2018-02-03 NOTE — Patient Instructions (Signed)
   The CDC recommends two doses of Shingrix (the shingles vaccine) separated by 2 to 6 months for adults age 76 years and older. I recommend checking with your insurance plan regarding coverage for this vaccine.   

## 2018-02-03 NOTE — Progress Notes (Signed)
Patient: Karen Henson Female    DOB: [birth year]   77 y.o.   MRN: [id number] Visit Date: 02/03/2018  Today's Provider: Lavon Paganini, MD   Chief Complaint  Patient presents with  . Hypertension   Subjective:    HPI  Hypertension, follow-up:  BP Readings from Last 3 Encounters:  02/03/18 124/80  01/27/18 (!) 124/96  12/05/17 119/79    She was last seen for hypertension 6 months ago.  BP at that visit was 124/74. Management since that visit includes discontinuing Lisinopril and started Losartan . She reports good compliance with treatment. She is not having side effects.  She is exercising. She is adherent to low salt diet.   Outside blood pressures are not checked regularly. She is experiencing none.  Patient denies chest pressure/discomfort, exertional chest pressure/discomfort, lower extremity edema and palpitations.    Weight trend: stable Wt Readings from Last 3 Encounters:  02/03/18 127 lb (57.6 kg)  01/27/18 126 lb (57.2 kg)  11/27/17 130 lb (59 kg)    Current diet: well balanced     No Known Allergies   Current Outpatient Medications:  .  dextromethorphan-guaiFENesin (MUCINEX DM) 30-600 MG 12hr tablet, Take 1 tablet by mouth daily., Disp: , Rfl:  .  loratadine (CLARITIN) 10 MG tablet, Take 10 mg by mouth daily. , Disp: , Rfl:  .  losartan-hydrochlorothiazide (HYZAAR) 50-12.5 MG tablet, Take 1 tablet daily by mouth., Disp: 90 tablet, Rfl: 3 .  montelukast (SINGULAIR) 10 MG tablet, TAKE 1 TABLET BY MOUTH ONCE DAILY AT NIGHT FOR ASTHMA, Disp: 30 tablet, Rfl: 11 .  acetaminophen (TYLENOL) 500 MG tablet, Take 500 mg by mouth every 6 (six) hours as needed for moderate pain or headache., Disp: , Rfl:  .  aspirin EC 81 MG tablet, Take 81 mg by mouth daily., Disp: , Rfl:  .  budesonide-formoterol (SYMBICORT) 80-4.5 MCG/ACT inhaler, Inhale 2 puffs into the lungs 2 (two) times daily. (Patient taking differently: Inhale 2 puffs into the lungs 2 (two) times  daily as needed (for shortness of breath or wheezing). ), Disp: 1 Inhaler, Rfl: 3 .  chlorpheniramine-HYDROcodone (TUSSIONEX PENNKINETIC ER) 10-8 MG/5ML SUER, Take 5 mLs by mouth every 12 (twelve) hours as needed for cough. (Patient not taking: Reported on 11/04/2017), Disp: 100 mL, Rfl: 0 .  clobetasol ointment (TEMOVATE) 1.60 %, Apply 1 application topically 2 (two) times daily. (Patient not taking: Reported on 07/23/2017), Disp: 30 g, Rfl: 1 .  levofloxacin (LEVAQUIN) 500 MG tablet, Take 1 tablet (500 mg total) by mouth daily. (Patient not taking: Reported on 02/03/2018), Disp: 7 tablet, Rfl: 0 .  predniSONE (STERAPRED UNI-PAK 21 TAB) 10 MG (21) TBPK tablet, As directed (Patient not taking: Reported on 02/03/2018), Disp: 21 tablet, Rfl: 0  Review of Systems  Constitutional: Negative for activity change, appetite change, chills, diaphoresis, fatigue, fever and unexpected weight change.  Respiratory: Negative for cough, chest tightness and shortness of breath.   Cardiovascular: Negative for chest pain, palpitations and leg swelling.  Musculoskeletal: Negative for arthralgias and back pain.  Neurological: Negative for dizziness, light-headedness and headaches.    Social History   Tobacco Use  . Smoking status: Former Smoker    Packs/day: 0.25    Types: Cigarettes  . Smokeless tobacco: Never Used  . Tobacco comment: smoked a pack a week, quit about 30 years ago  Substance Use Topics  . Alcohol use: Yes    Comment: ocassional- monthly   Objective:   BP  124/80 (BP Location: Left Arm, Patient Position: Sitting, Cuff Size: Normal)   Pulse 70   Temp 98.1 F (36.7 C)   Resp 16   Wt 127 lb (57.6 kg)   SpO2 97%   BMI 23.23 kg/m  Vitals:   02/03/18 1101  BP: 124/80  Pulse: 70  Resp: 16  Temp: 98.1 F (36.7 C)  SpO2: 97%  Weight: 127 lb (57.6 kg)     Physical Exam  Constitutional: She is oriented to person, place, and time. She appears well-developed and well-nourished. No distress.    HENT:  Head: Normocephalic and atraumatic.  Eyes: Conjunctivae are normal. No scleral icterus.  Neck: Neck supple. No thyromegaly present.  Cardiovascular: Normal rate, regular rhythm, normal heart sounds and intact distal pulses.  No murmur heard. Pulmonary/Chest: Effort normal and breath sounds normal. No respiratory distress. She has no wheezes. She has no rales.  Musculoskeletal: She exhibits no edema or deformity.  Lymphadenopathy:    She has no cervical adenopathy.  Neurological: She is alert and oriented to person, place, and time.  Skin: Skin is warm and dry. Capillary refill takes less than 2 seconds. No rash noted.  Psychiatric: She has a normal mood and affect. Her behavior is normal.  Vitals reviewed.      Assessment & Plan:   Problem List Items Addressed This Visit      Cardiovascular and Mediastinum   BP (high blood pressure) - Primary    Well controlled Continue losartan/hctz at current dose Chronic cough much improved since stopping lisinopril (added to allergy list) Check BMP F/u at CPE in 6 months      Relevant Orders   Basic Metabolic Panel (BMET)     Other   Chronic cough    Much improved since stopping lisinopril Added to her allergy list Avoid ACE inhibitors          Return in about 6 months (around 08/06/2018) for AWV/CPE.   The entirety of the information documented in the History of Present Illness, Review of Systems and Physical Exam were personally obtained by me. Portions of this information were initially documented by Wilburt Finlay, CMA and reviewed by me for thoroughness and accuracy.    Virginia Crews, MD, MPH [facility] 02/03/2018 11:21 AM

## 2018-02-03 NOTE — Assessment & Plan Note (Signed)
Much improved since stopping lisinopril Added to her allergy list Avoid ACE inhibitors

## 2018-02-03 NOTE — Assessment & Plan Note (Signed)
Well controlled Continue losartan/hctz at current dose Chronic cough much improved since stopping lisinopril (added to allergy list) Check BMP F/u at CPE in 6 months

## 2018-02-04 ENCOUNTER — Telehealth: Payer: Self-pay

## 2018-02-04 LAB — BASIC METABOLIC PANEL
BUN/Creatinine Ratio: 21 (ref 12–28)
BUN: 15 mg/dL (ref 8–27)
CALCIUM: 9.7 mg/dL (ref 8.7–10.3)
CO2: 25 mmol/L (ref 20–29)
Chloride: 89 mmol/L — ABNORMAL LOW (ref 96–106)
Creatinine, Ser: 0.7 mg/dL (ref 0.57–1.00)
GFR, EST AFRICAN AMERICAN: 97 mL/min/{1.73_m2} (ref >59)
GFR, EST NON AFRICAN AMERICAN: 84 mL/min/{1.73_m2} (ref >59)
Glucose: 87 mg/dL (ref 65–99)
Potassium: 5.1 mmol/L (ref 3.5–5.2)
Sodium: 131 mmol/L — ABNORMAL LOW (ref 134–144)

## 2018-02-04 NOTE — Telephone Encounter (Signed)
Pt advised.

## 2018-02-04 NOTE — Telephone Encounter (Signed)
-----   Message from Virginia Crews, MD sent at 02/04/2018  8:55 AM EDT ----- Stable kidney function and electrolytes.  Virginia Crews, MD, MPH [facility] 02/04/2018 8:55 AM

## 2018-02-06 ENCOUNTER — Ambulatory Visit: Payer: Self-pay | Admitting: Internal Medicine

## 2018-03-13 ENCOUNTER — Encounter: Payer: Self-pay | Admitting: Family Medicine

## 2018-03-13 ENCOUNTER — Ambulatory Visit (INDEPENDENT_AMBULATORY_CARE_PROVIDER_SITE_OTHER): Payer: Medicare Other | Admitting: Family Medicine

## 2018-03-13 VITALS — BP 114/70 | HR 75 | Temp 98.0°F | Resp 16 | Wt 126.0 lb

## 2018-03-13 DIAGNOSIS — R300 Dysuria: Secondary | ICD-10-CM | POA: Diagnosis not present

## 2018-03-13 DIAGNOSIS — R350 Frequency of micturition: Secondary | ICD-10-CM

## 2018-03-13 MED ORDER — CEPHALEXIN 500 MG PO CAPS: 500.0000 mg | ORAL_CAPSULE | Freq: Two times a day (BID) | ORAL | 0 refills | Status: DC

## 2018-03-13 NOTE — Patient Instructions (Signed)

## 2018-03-13 NOTE — Progress Notes (Signed)
Patient: Karen Henson Female    DOB: 1941/04/06   77 y.o.   MRN: [id number] Visit Date: 03/13/2018  Today's Provider: Lavon Paganini, MD   I, Martha Clan, CMA, am acting as scribe for Lavon Paganini, MD.  Chief Complaint  Patient presents with  . Urinary Tract Infection   Subjective:    Urinary Tract Infection   This is a new problem. The current episode started yesterday. The problem has been gradually worsening. The quality of the pain is described as burning. The pain is moderate. Maximum temperature: has felt feverish, but no documented temp. Associated symptoms include chills, frequency, hesitancy, sweats and urgency. Pertinent negatives include no discharge, flank pain, hematuria, nausea or vomiting. Treatments tried: AZO. The treatment provided mild relief.       Allergies  Allergen Reactions  . Lisinopril Cough     Current Outpatient Medications:  .  acetaminophen (TYLENOL) 500 MG tablet, Take 500 mg by mouth every 6 (six) hours as needed for moderate pain or headache., Disp: , Rfl:  .  budesonide-formoterol (SYMBICORT) 80-4.5 MCG/ACT inhaler, Inhale 2 puffs into the lungs 2 (two) times daily. (Patient taking differently: Inhale 2 puffs into the lungs 2 (two) times daily as needed (for shortness of breath or wheezing). ), Disp: 1 Inhaler, Rfl: 3 .  dextromethorphan-guaiFENesin (MUCINEX DM) 30-600 MG 12hr tablet, Take 1 tablet by mouth daily., Disp: , Rfl:  .  loratadine (CLARITIN) 10 MG tablet, Take 10 mg by mouth daily. , Disp: , Rfl:  .  losartan-hydrochlorothiazide (HYZAAR) 50-12.5 MG tablet, Take 1 tablet daily by mouth., Disp: 90 tablet, Rfl: 3 .  montelukast (SINGULAIR) 10 MG tablet, TAKE 1 TABLET BY MOUTH ONCE DAILY AT NIGHT FOR ASTHMA, Disp: 30 tablet, Rfl: 11  Review of Systems  Constitutional: Positive for chills.  Gastrointestinal: Negative for nausea and vomiting.  Genitourinary: Positive for frequency, hesitancy and urgency. Negative for  flank pain and hematuria.    Social History   Tobacco Use  . Smoking status: Former Smoker    Packs/day: 0.25    Types: Cigarettes  . Smokeless tobacco: Never Used  . Tobacco comment: smoked a pack a week, quit about 30 years ago  Substance Use Topics  . Alcohol use: Yes    Comment: ocassional- monthly   Objective:   BP 114/70 (BP Location: Left Arm, Patient Position: Sitting, Cuff Size: Normal)   Pulse 75   Temp 98 F (36.7 C) (Oral)   Resp 16   Wt 126 lb (57.2 kg)   SpO2 97%   BMI 23.05 kg/m  Vitals:   03/13/18 1013  BP: 114/70  Pulse: 75  Resp: 16  Temp: 98 F (36.7 C)  TempSrc: Oral  SpO2: 97%  Weight: 126 lb (57.2 kg)     Physical Exam  Constitutional: She is oriented to person, place, and time. She appears well-developed and well-nourished. No distress.  HENT:  Head: Normocephalic and atraumatic.  Eyes: Conjunctivae are normal. No scleral icterus.  Cardiovascular: Normal rate, regular rhythm, normal heart sounds and intact distal pulses.  No murmur heard. Pulmonary/Chest: Effort normal and breath sounds normal. No respiratory distress. She has no wheezes. She has no rales.  Abdominal: Soft. She exhibits no distension. There is no tenderness. There is no guarding and no CVA tenderness.  Musculoskeletal: She exhibits no edema.  Neurological: She is alert and oriented to person, place, and time.  Skin: Skin is warm and dry. Capillary refill takes less  than 2 seconds. No rash noted.  Psychiatric: She has a normal mood and affect. Her behavior is normal.  Vitals reviewed.   UA dipstick unable to be interpreted due to AZO use and orange color.    Assessment & Plan:   1. Dysuria 2. Urinary frequency - symptoms consistent with UTI, but unable to confirm with UA - no evidence of systemic infection or pyelonephritis - send Urine micro and culture to confirm diagnosis and check sensitivities - start Keflex x5d empirically while awaiting results - discussed  return precautions - Urine Microscopic - Urine Culture    Meds ordered this encounter  Medications  . cephALEXin (KEFLEX) 500 MG capsule    Sig: Take 1 capsule (500 mg total) by mouth 2 (two) times daily for 5 days.    Dispense:  10 capsule    Refill:  0     Return if symptoms worsen or fail to improve.   The entirety of the information documented in the History of Present Illness, Review of Systems and Physical Exam were personally obtained by me. Portions of this information were initially documented by Raquel Sarna Ratchford, CMA and reviewed by me for thoroughness and accuracy.    Virginia Crews, MD, MPH [facility] 03/13/2018 10:32 AM

## 2018-03-14 LAB — URINALYSIS, MICROSCOPIC ONLY: Casts: NONE SEEN /lpf

## 2018-03-16 LAB — URINE CULTURE

## 2018-03-17 ENCOUNTER — Telehealth: Payer: Self-pay

## 2018-03-17 NOTE — Telephone Encounter (Signed)
-----   Message from Virginia Crews, MD sent at 03/17/2018  8:49 AM EDT ----- UTI should have been well treated with antibiotics.  Hope she is doing well.  Virginia Crews, MD, MPH [facility] 03/17/2018 8:49 AM

## 2018-03-17 NOTE — Telephone Encounter (Signed)
Pt advised.

## 2018-04-23 ENCOUNTER — Other Ambulatory Visit: Payer: Self-pay | Admitting: Family Medicine

## 2018-05-01 ENCOUNTER — Ambulatory Visit (INDEPENDENT_AMBULATORY_CARE_PROVIDER_SITE_OTHER): Payer: Medicare Other | Admitting: Adult Health

## 2018-05-01 ENCOUNTER — Encounter: Payer: Self-pay | Admitting: Adult Health

## 2018-05-01 VITALS — BP 132/90 | HR 70 | Resp 16 | Ht 62.0 in | Wt 125.8 lb

## 2018-05-01 DIAGNOSIS — J449 Chronic obstructive pulmonary disease, unspecified: Secondary | ICD-10-CM

## 2018-05-01 DIAGNOSIS — R0602 Shortness of breath: Secondary | ICD-10-CM | POA: Diagnosis not present

## 2018-05-01 DIAGNOSIS — J84112 Idiopathic pulmonary fibrosis: Secondary | ICD-10-CM | POA: Diagnosis not present

## 2018-05-01 DIAGNOSIS — I10 Essential (primary) hypertension: Secondary | ICD-10-CM | POA: Diagnosis not present

## 2018-05-01 NOTE — Progress Notes (Signed)
[facility] [place], [place] [address]  Internal MEDICINE  Office Visit Note  Patient Name: Karen Henson  [id number]  [id number]  Date of Service: 05/01/2018  Chief Complaint  Patient presents with  . Bronchitis  . COPD    HPI  Pt here for follow up. She is in generally good health and has no complaints today.  She was treated for bronchitis in 3 months ago, and has been doing well since.  She denies hosptilizations.  She has been using her rescue inhaler as prescribed, and states she has not needed it much since recovering.  She denies chest pain, sob or fatigue.  She has a history significant for HTM, COPD, and IPF.     Current Medication: Outpatient Encounter Medications as of 05/01/2018  Medication Sig  . acetaminophen (TYLENOL) 500 MG tablet Take 500 mg by mouth every 6 (six) hours as needed for moderate pain or headache.  . budesonide-formoterol (SYMBICORT) 80-4.5 MCG/ACT inhaler Inhale 2 puffs into the lungs 2 (two) times daily. (Patient taking differently: Inhale 2 puffs into the lungs 2 (two) times daily as needed (for shortness of breath or wheezing). )  . dextromethorphan-guaiFENesin (MUCINEX DM) 30-600 MG 12hr tablet Take 1 tablet by mouth daily.  Marland Kitchen loratadine (CLARITIN) 10 MG tablet Take 10 mg by mouth daily.   Marland Kitchen losartan-hydrochlorothiazide (HYZAAR) 50-12.5 MG tablet TAKE 1 TABLET BY MOUTH ONCE DAILY  . montelukast (SINGULAIR) 10 MG tablet TAKE 1 TABLET BY MOUTH ONCE DAILY AT NIGHT FOR ASTHMA   No facility-administered encounter medications on file as of 05/01/2018.     Surgical History: Past Surgical History:  Procedure Laterality Date  . BREAST BIOPSY Left 1996   neg  . BREAST LUMPECTOMY Left   . CATARACT EXTRACTION W/PHACO Left 10/31/2017   Procedure: CATARACT EXTRACTION PHACO AND INTRAOCULAR LENS PLACEMENT (IOC);  Surgeon: Eulogio Bear, MD;  Location: [facility] ORS;  Service: Ophthalmology;  Laterality: Left;  [place] 00:43.7 AP% 19.6 CDE  8.57 FLUID PACK LOT # [id number]  . CATARACT EXTRACTION W/PHACO Right 12/05/2017   Procedure: CATARACT EXTRACTION PHACO AND INTRAOCULAR LENS PLACEMENT (IOC);  Surgeon: Eulogio Bear, MD;  Location: [facility] ORS;  Service: Ophthalmology;  Laterality: Right;  fluid pack lot #[id number] H  exp 07/01/2019 [place]    00:31.4 AP%    8.8 CDE    2.79  . COLONOSCOPY  02/15/2006  . COLONOSCOPY WITH PROPOFOL N/A 03/21/2016   Procedure: COLONOSCOPY WITH PROPOFOL;  Surgeon: Robert Bellow, MD;  Location: [facility] ENDOSCOPY;  Service: Endoscopy;  Laterality: N/A;  . DILATION AND CURETTAGE OF UTERUS    . VARICOSE VEIN SURGERY      Medical History: Past Medical History:  Diagnosis Date  . Allergy    Environmental Allergies  . Arthritis   . Bronchitis, chronic ([place])   . Cough    CHRONIC  . Dyspnea    OCCAS  . Hypertension   . 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[place])   . Osteoporosis     Family History: Family History  Problem Relation Age of Onset  . Hypertension Mother   . Emphysema Father   . Stroke Sister   . Stroke Brother   . Diabetes Maternal Aunt   . Diabetes Maternal Grandmother   . Lung cancer Grandchild   . Breast cancer Neg Hx   . Ovarian cancer Neg Hx   . Colon cancer Neg Hx     Social History   Socioeconomic History  . Marital status: Widowed  Spouse name: Not on file  . Number of children: 2  . Years of education: Not on file  . Highest education level: Not on file  Occupational History  . Occupation: retired  Scientific laboratory technician  . Financial resource strain: Not on file  . Food insecurity:    Worry: Not on file    Inability: Not on file  . Transportation needs:    Medical: Not on file    Non-medical: Not on file  Tobacco Use  . Smoking status: Former Smoker    Packs/day: 0.25    Types: Cigarettes  . Smokeless tobacco: Never Used  . Tobacco comment: smoked a pack a week, quit about 30 years ago  Substance and Sexual Activity  . Alcohol use: Yes    Comment: ocassional- monthly  . Drug use:  No  . Sexual activity: Never  Lifestyle  . Physical activity:    Days per week: 2 days    Minutes per session: 30 min  . Stress: Not on file  Relationships  . Social connections:    Talks on phone: Not on file    Gets together: Not on file    Attends religious service: Not on file    Active member of club or organization: Not on file    Attends meetings of clubs or organizations: Not on file    Relationship status: Not on file  . Intimate partner violence:    Fear of current or ex partner: Not on file    Emotionally abused: Not on file    Physically abused: Not on file    Forced sexual activity: Not on file  Other Topics Concern  . Not on file  Social History Narrative  . Not on file      Review of Systems  Constitutional: Negative for chills, fatigue and unexpected weight change.  HENT: Negative for congestion, rhinorrhea, sneezing and sore throat.   Eyes: Negative for photophobia, pain and redness.  Respiratory: Negative for cough, chest tightness and shortness of breath.   Cardiovascular: Negative for chest pain and palpitations.  Gastrointestinal: Negative for abdominal pain, constipation, diarrhea, nausea and vomiting.  Endocrine: Negative.   Genitourinary: Negative for dysuria and frequency.  Musculoskeletal: Negative for arthralgias, back pain, joint swelling and neck pain.  Skin: Negative for rash.  Allergic/Immunologic: Negative.   Neurological: Negative for tremors and numbness.  Hematological: Negative for adenopathy. Does not bruise/bleed easily.  Psychiatric/Behavioral: Negative for behavioral problems and sleep disturbance. The patient is not nervous/anxious.     Vital Signs: BP 132/90   Pulse 70   Resp 16   Ht 5\' 2"  (1.575 m)   Wt 125 lb 12.8 oz (57.1 kg)   SpO2 95%   BMI 23.01 kg/m    Physical Exam  Constitutional: She is oriented to person, place, and time. She appears well-developed and well-nourished. No distress.  HENT:  Head:  Normocephalic and atraumatic.  Mouth/Throat: Oropharynx is clear and moist. No oropharyngeal exudate.  Eyes: Pupils are equal, round, and reactive to light. EOM are normal.  Neck: Normal range of motion. Neck supple. No JVD present. No tracheal deviation present. No thyromegaly present.  Cardiovascular: Normal rate, regular rhythm and normal heart sounds. Exam reveals no gallop and no friction rub.  No murmur heard. Pulmonary/Chest: Effort normal and breath sounds normal. No respiratory distress. She has no wheezes. She has no rales. She exhibits no tenderness.  Abdominal: Soft. There is no tenderness. There is no guarding.  Musculoskeletal: Normal range  of motion.  Lymphadenopathy:    She has no cervical adenopathy.  Neurological: She is alert and oriented to person, place, and time. No cranial nerve deficit.  Skin: Skin is warm and dry. She is not diaphoretic.  Psychiatric: She has a normal mood and affect. Her behavior is normal. Judgment and thought content normal.  Nursing note and vitals reviewed.   Assessment/Plan: 1. Chronic obstructive pulmonary disease, unspecified COPD type ([place]) Stable, denies exacerbation. Continue current therapy.  Bronchitis has resolved.    2. Essential hypertension Controlled on current medications.    3. IPF (idiopathic pulmonary fibrosis) (H[facility]) Stable at this time. Continue to follow up.    4. Shortness of breath - Spirometry with Graph  General Counseling: Charde verbalizes understanding of the findings of todays visit and agrees with plan of treatment. I have discussed any further diagnostic evaluation that may be needed or ordered today. We also reviewed her medications today. she has been encouraged to call the office with any questions or concerns that should arise related to todays visit.    Orders Placed This Encounter  Procedures  . Spirometry with Graph    No orders of the defined types were placed in this encounter.   Time  spent: 25 Minutes   This patient was seen by Orson Gear AGNP-C in Collaboration with Dr Lavera Guise as a part of collaborative care agreement    Dr Lavera Guise Internal medicine

## 2018-05-01 NOTE — Patient Instructions (Signed)

## 2018-05-06 ENCOUNTER — Encounter: Payer: Self-pay | Admitting: Adult Health

## 2018-05-17 ENCOUNTER — Ambulatory Visit (INDEPENDENT_AMBULATORY_CARE_PROVIDER_SITE_OTHER): Payer: Medicare Other | Admitting: Family Medicine

## 2018-05-17 ENCOUNTER — Encounter: Payer: Self-pay | Admitting: Family Medicine

## 2018-05-17 VITALS — BP 122/72 | HR 79 | Temp 97.7°F | Resp 16 | Wt 125.0 lb

## 2018-05-17 DIAGNOSIS — N3090 Cystitis, unspecified without hematuria: Secondary | ICD-10-CM | POA: Diagnosis not present

## 2018-05-17 LAB — POCT URINALYSIS DIPSTICK
GLUCOSE UA: NEGATIVE
KETONES UA: NEGATIVE
Nitrite, UA: NEGATIVE
Odor: NORMAL
Protein, UA: POSITIVE — AB
SPEC GRAV UA: 1.02 (ref 1.010–1.025)
Urobilinogen, UA: 0.2 E.U./dL
pH, UA: 6.5 (ref 5.0–8.0)

## 2018-05-17 MED ORDER — NITROFURANTOIN MACROCRYSTAL 100 MG PO CAPS: 100.0000 mg | ORAL_CAPSULE | Freq: Two times a day (BID) | ORAL | 0 refills | Status: DC

## 2018-05-17 NOTE — Progress Notes (Signed)
Patient: Karen Henson Female    DOB: 1940-12-05   77 y.o.   MRN: [id number] Visit Date: 05/17/2018  Today's Provider: Wilhemena Durie, MD   Chief Complaint  Patient presents with  . Urinary Tract Infection   Subjective:    Urinary Tract Infection   This is a new problem. Episode onset: x 2 days. The problem has been gradually worsening. The quality of the pain is described as burning. The pain is moderate. There has been no fever. Associated symptoms include chills, flank pain, frequency, hesitancy and urgency. Pertinent negatives include no discharge, hematuria, nausea, sweats or vomiting. She has tried nothing for the symptoms.  No back/flank pain.     Allergies  Allergen Reactions  . Lisinopril Cough     Current Outpatient Medications:  .  acetaminophen (TYLENOL) 500 MG tablet, Take 500 mg by mouth every 6 (six) hours as needed for moderate pain or headache., Disp: , Rfl:  .  budesonide-formoterol (SYMBICORT) 80-4.5 MCG/ACT inhaler, Inhale 2 puffs into the lungs 2 (two) times daily. (Patient taking differently: Inhale 2 puffs into the lungs 2 (two) times daily as needed (for shortness of breath or wheezing). ), Disp: 1 Inhaler, Rfl: 3 .  dextromethorphan-guaiFENesin (MUCINEX DM) 30-600 MG 12hr tablet, Take 1 tablet by mouth daily., Disp: , Rfl:  .  loratadine (CLARITIN) 10 MG tablet, Take 10 mg by mouth daily. , Disp: , Rfl:  .  losartan-hydrochlorothiazide (HYZAAR) 50-12.5 MG tablet, TAKE 1 TABLET BY MOUTH ONCE DAILY, Disp: 90 tablet, Rfl: 3 .  montelukast (SINGULAIR) 10 MG tablet, TAKE 1 TABLET BY MOUTH ONCE DAILY AT NIGHT FOR ASTHMA, Disp: 30 tablet, Rfl: 11  Review of Systems  Constitutional: Positive for chills.  Eyes: Negative.   Respiratory: Negative.   Cardiovascular: Negative.   Gastrointestinal: Negative for nausea and vomiting.  Endocrine: Negative.   Genitourinary: Positive for flank pain, frequency, hesitancy and urgency. Negative for hematuria.    Allergic/Immunologic: Negative.   Hematological: Negative.   Psychiatric/Behavioral: Negative.     Social History   Tobacco Use  . Smoking status: Former Smoker    Packs/day: 0.25    Types: Cigarettes  . Smokeless tobacco: Never Used  . Tobacco comment: smoked a pack a week, quit about 30 years ago  Substance Use Topics  . Alcohol use: Yes    Comment: ocassional- monthly   Objective:   BP 122/72 (BP Location: Left Arm, Patient Position: Sitting, Cuff Size: Normal)   Pulse 79   Temp 97.7 F (36.5 C) (Oral)   Resp 16   Wt 125 lb (56.7 kg)   SpO2 96%   BMI 22.86 kg/m  Vitals:   05/17/18 0909  BP: 122/72  Pulse: 79  Resp: 16  Temp: 97.7 F (36.5 C)  TempSrc: Oral  SpO2: 96%  Weight: 125 lb (56.7 kg)     Physical Exam  Constitutional: She is oriented to person, place, and time. She appears well-developed and well-nourished.  HENT:  Head: Normocephalic and atraumatic.  Eyes: Conjunctivae are normal. No scleral icterus.  Neck: Neck supple.  Cardiovascular: Normal rate, regular rhythm and normal heart sounds.  Pulmonary/Chest: Effort normal.  Abdominal: Soft.  No CVAT.  Musculoskeletal: She exhibits no edema.  Neurological: She is alert and oriented to person, place, and time.  Skin: Skin is warm and dry.  Psychiatric: She has a normal mood and affect. Her behavior is normal. Judgment and thought content normal.  Assessment & Plan:     1. Cystitis Macrobid for 5 days. - Urine Culture - POCT urinalysis dipstick     Patient seen and examined by Miguel Aschoff, MD, and note scribed by Renaldo Fiddler, CMA. I have done the exam and reviewed the above chart and it is accurate to the best of my knowledge. Development worker, community has been used in this note in any air is in the dictation or transcription are unintentional.  Wilhemena Durie, MD  [place]

## 2018-05-21 ENCOUNTER — Telehealth: Payer: Self-pay

## 2018-05-21 DIAGNOSIS — N3090 Cystitis, unspecified without hematuria: Secondary | ICD-10-CM

## 2018-05-21 LAB — URINE CULTURE

## 2018-05-21 MED ORDER — CIPROFLOXACIN HCL 250 MG PO TABS: 250.0000 mg | ORAL_TABLET | Freq: Two times a day (BID) | ORAL | 0 refills | Status: DC

## 2018-05-21 NOTE — Telephone Encounter (Signed)
-----   Message from Jerrol Banana., MD sent at 05/21/2018 11:59 AM EDT ----- Rx with cipro 250mg  BID for 3 days

## 2018-05-21 NOTE — Telephone Encounter (Signed)
Advised patient of results. Medication was sent into the pharmacy.  

## 2018-06-09 ENCOUNTER — Encounter: Payer: Self-pay | Admitting: Family Medicine

## 2018-06-09 ENCOUNTER — Ambulatory Visit (INDEPENDENT_AMBULATORY_CARE_PROVIDER_SITE_OTHER): Payer: Medicare Other | Admitting: Family Medicine

## 2018-06-09 VITALS — BP 112/80 | HR 51 | Temp 98.5°F | Resp 16 | Wt 128.4 lb

## 2018-06-09 DIAGNOSIS — N3090 Cystitis, unspecified without hematuria: Secondary | ICD-10-CM | POA: Diagnosis not present

## 2018-06-09 LAB — POCT URINALYSIS DIPSTICK
BILIRUBIN UA: NEGATIVE
GLUCOSE UA: NEGATIVE
Ketones, UA: NEGATIVE
Nitrite, UA: NEGATIVE
PH UA: 6 (ref 5.0–8.0)
Protein, UA: NEGATIVE
Spec Grav, UA: 1.015 (ref 1.010–1.025)
UROBILINOGEN UA: 0.2 U/dL

## 2018-06-09 MED ORDER — CEPHALEXIN 500 MG PO CAPS: 500.0000 mg | ORAL_CAPSULE | Freq: Two times a day (BID) | ORAL | 0 refills | Status: DC

## 2018-06-09 NOTE — Progress Notes (Signed)
  Subjective:     Patient ID: Karen Henson, female   DOB: [birth year], 77 y.o.   MRN: [id number] Chief Complaint  Patient presents with  . Dysuria    Patient comes in office today with concerns of dysuria, frequency and urgency to urinate for less than twelve hrs.    HPI Has had two prior UTI's in the last 3 months requiring Cipro for rx (Staph and Pseudomonas). States she missed her f/u appointment with Dr. Jacinto Reap. Review of Systems     Objective:   Physical Exam  Constitutional: She appears well-developed and well-nourished. No distress.  Genitourinary:  Genitourinary Comments: No cva tenderness on percussion       Assessment:    1. Cystitis - POCT urinalysis dipstick - Urine Culture - cephALEXin (KEFLEX) 500 MG capsule; Take 1 capsule (500 mg total) by mouth 2 (two) times daily.  Dispense: 14 capsule; Refill: 0    Plan:    Further f/u pending x-ray results.

## 2018-06-09 NOTE — Patient Instructions (Signed)
We will call you with the urine culture report.

## 2018-06-11 ENCOUNTER — Telehealth: Payer: Self-pay

## 2018-06-11 LAB — URINE CULTURE

## 2018-06-11 NOTE — Telephone Encounter (Signed)
Patient states symptoms still present appt made for follow up on 06/16/18. KW

## 2018-06-11 NOTE — Telephone Encounter (Signed)
-----   Message from Carmon Ginsberg, [place] sent at 06/11/2018  7:18 AM EDT ----- No organism detected on culture. May stop antibiotic. If not feeling better would f/u with Dr. Jacinto Reap.

## 2018-06-16 ENCOUNTER — Encounter: Payer: Self-pay | Admitting: Family Medicine

## 2018-06-16 ENCOUNTER — Encounter: Payer: Medicare Other | Admitting: Family Medicine

## 2018-06-16 NOTE — Progress Notes (Deleted)
       Patient: Karen Henson Female    DOB: 08/24/1941   77 y.o.   MRN: [id number] Visit Date: 06/16/2018  Today's Provider: Lavon Paganini, MD   Chief Complaint  Patient presents with  . Cystitis    follow up    Subjective:    I, Tiburcio Pea, CMA, am acting as a scribe for Lavon Paganini, MD.   HPI  Cystitis: Patient presents for a follow up. Last OV was on 06/09/2018. Patient started on Cephalexin 500 mg BID. Urine culture did not detect any organisms. Patient was advised to stop antibiotic and follow up if symptoms still present. Patient states    Allergies  Allergen Reactions  . Lisinopril Cough     Current Outpatient Medications:  .  acetaminophen (TYLENOL) 500 MG tablet, Take 500 mg by mouth every 6 (six) hours as needed for moderate pain or headache., Disp: , Rfl:  .  budesonide-formoterol (SYMBICORT) 80-4.5 MCG/ACT inhaler, Inhale 2 puffs into the lungs 2 (two) times daily. (Patient taking differently: Inhale 2 puffs into the lungs 2 (two) times daily as needed (for shortness of breath or wheezing). ), Disp: 1 Inhaler, Rfl: 3 .  cephALEXin (KEFLEX) 500 MG capsule, Take 1 capsule (500 mg total) by mouth 2 (two) times daily., Disp: 14 capsule, Rfl: 0 .  dextromethorphan-guaiFENesin (MUCINEX DM) 30-600 MG 12hr tablet, Take 1 tablet by mouth daily., Disp: , Rfl:  .  loratadine (CLARITIN) 10 MG tablet, Take 10 mg by mouth daily. , Disp: , Rfl:  .  losartan-hydrochlorothiazide (HYZAAR) 50-12.5 MG tablet, TAKE 1 TABLET BY MOUTH ONCE DAILY, Disp: 90 tablet, Rfl: 3 .  montelukast (SINGULAIR) 10 MG tablet, TAKE 1 TABLET BY MOUTH ONCE DAILY AT NIGHT FOR ASTHMA, Disp: 30 tablet, Rfl: 11  Review of Systems  Constitutional: Negative.   Respiratory: Negative.   Cardiovascular: Negative.   Genitourinary: Negative.   Musculoskeletal: Negative.     Social History   Tobacco Use  . Smoking status: Former Smoker    Packs/day: 0.25    Types: Cigarettes  . Smokeless  tobacco: Never Used  . Tobacco comment: smoked a pack a week, quit about 30 years ago  Substance Use Topics  . Alcohol use: Yes    Comment: ocassional- monthly   Objective:   There were no vitals taken for this visit. There were no vitals filed for this visit.   Physical Exam      Assessment & Plan:           Lavon Paganini, MD  [facility]

## 2018-06-18 NOTE — Progress Notes (Signed)
This encounter was created in error - please disregard.

## 2018-07-01 ENCOUNTER — Other Ambulatory Visit: Payer: Self-pay | Admitting: Family Medicine

## 2018-07-01 DIAGNOSIS — Z1231 Encounter for screening mammogram for malignant neoplasm of breast: Secondary | ICD-10-CM

## 2018-07-21 ENCOUNTER — Ambulatory Visit
Admission: RE | Admit: 2018-07-21 | Discharge: 2018-07-21 | Disposition: A | Discharge status: Home / Self Care | Payer: Medicare Other | Source: Ambulatory Visit | Attending: Family Medicine | Admitting: Family Medicine

## 2018-07-21 DIAGNOSIS — Z1231 Encounter for screening mammogram for malignant neoplasm of breast: Secondary | ICD-10-CM | POA: Diagnosis not present

## 2018-07-21 DIAGNOSIS — Z23 Encounter for immunization: Secondary | ICD-10-CM | POA: Diagnosis not present

## 2018-08-06 ENCOUNTER — Ambulatory Visit (INDEPENDENT_AMBULATORY_CARE_PROVIDER_SITE_OTHER): Payer: Medicare Other | Admitting: Family Medicine

## 2018-08-06 ENCOUNTER — Encounter: Payer: Self-pay | Admitting: Family Medicine

## 2018-08-06 ENCOUNTER — Ambulatory Visit (INDEPENDENT_AMBULATORY_CARE_PROVIDER_SITE_OTHER): Payer: Medicare Other

## 2018-08-06 VITALS — BP 116/72 | HR 67 | Temp 97.9°F | Ht 62.0 in | Wt 127.6 lb

## 2018-08-06 DIAGNOSIS — M810 Age-related osteoporosis without current pathological fracture: Secondary | ICD-10-CM

## 2018-08-06 DIAGNOSIS — Z0000 Encounter for general adult medical examination without abnormal findings: Secondary | ICD-10-CM

## 2018-08-06 DIAGNOSIS — E2839 Other primary ovarian failure: Secondary | ICD-10-CM | POA: Diagnosis not present

## 2018-08-06 DIAGNOSIS — J8410 Pulmonary fibrosis, unspecified: Secondary | ICD-10-CM

## 2018-08-06 DIAGNOSIS — Z13220 Encounter for screening for lipoid disorders: Secondary | ICD-10-CM

## 2018-08-06 DIAGNOSIS — I10 Essential (primary) hypertension: Secondary | ICD-10-CM | POA: Diagnosis not present

## 2018-08-06 MED ORDER — LOSARTAN POTASSIUM-HCTZ 50-12.5 MG PO TABS: 1.0000 | ORAL_TABLET | Freq: Every day | ORAL | 3 refills | Status: DC

## 2018-08-06 NOTE — Patient Instructions (Addendum)
Ms. Karen Henson , Thank you for taking time to come for your Medicare Wellness Visit. I appreciate your ongoing commitment to your health goals. Please review the following plan we discussed and let me know if I can assist you in the future.   Screening recommendations/referrals: Colonoscopy: Up to date, due 03/2021 Mammogram: Up to date, due 07/2020 Bone Density: Ordered today, pt advised office will call to set up apt. Recommended yearly ophthalmology/optometry visit for glaucoma screening and checkup Recommended yearly dental visit for hygiene and checkup  Vaccinations: Influenza vaccine: Up to date Pneumococcal vaccine: Completed series Tdap vaccine: Pt declines today.  Shingles vaccine: Pt declines today.     Advanced directives: Please bring a copy of your POA (Power of Attorney) and/or Living Will to your next appointment.   Conditions/risks identified: Recommend to monitor sugar intake and avoid sweets in daily diet.   Next appointment: 10:00 AM today with Dr Brita Romp.    Preventive Care 5 Years and Older, Female Preventive care refers to lifestyle choices and visits with your health care provider that can promote health and wellness. What does preventive care include?  A yearly physical exam. This is also called an annual well check.  Dental exams once or twice a year.  Routine eye exams. Ask your health care provider how often you should have your eyes checked.  Personal lifestyle choices, including:  Daily care of your teeth and gums.  Regular physical activity.  Eating a healthy diet.  Avoiding tobacco and drug use.  Limiting alcohol use.  Practicing safe sex.  Taking low-dose aspirin every day.  Taking vitamin and mineral supplements as recommended by your health care provider. What happens during an annual well check? The services and screenings done by your health care provider during your annual well check will depend on your age, overall health,  lifestyle risk factors, and family history of disease. Counseling  Your health care provider may ask you questions about your:  Alcohol use.  Tobacco use.  Drug use.  Emotional well-being.  Home and relationship well-being.  Sexual activity.  Eating habits.  History of falls.  Memory and ability to understand (cognition).  Work and work Statistician.  Reproductive health. Screening  You may have the following tests or measurements:  Height, weight, and BMI.  Blood pressure.  Lipid and cholesterol levels. These may be checked every 5 years, or more frequently if you are over 74 years old.  Skin check.  Lung cancer screening. You may have this screening every year starting at age 37 if you have a 30-pack-year history of smoking and currently smoke or have quit within the past 15 years.  Fecal occult blood test (FOBT) of the stool. You may have this test every year starting at age 35.  Flexible sigmoidoscopy or colonoscopy. You may have a sigmoidoscopy every 5 years or a colonoscopy every 10 years starting at age 61.  Hepatitis C blood test.  Hepatitis B blood test.  Sexually transmitted disease (STD) testing.  Diabetes screening. This is done by checking your blood sugar (glucose) after you have not eaten for a while (fasting). You may have this done every 1-3 years.  Bone density scan. This is done to screen for osteoporosis. You may have this done starting at age 78.  Mammogram. This may be done every 1-2 years. Talk to your health care provider about how often you should have regular mammograms. Talk with your health care provider about your test results, treatment options, and if  necessary, the need for more tests. Vaccines  Your health care provider may recommend certain vaccines, such as:  Influenza vaccine. This is recommended every year.  Tetanus, diphtheria, and acellular pertussis (Tdap, Td) vaccine. You may need a Td booster every 10 years.  Zoster  vaccine. You may need this after age 24.  Pneumococcal 13-valent conjugate (PCV13) vaccine. One dose is recommended after age 27.  Pneumococcal polysaccharide (PPSV23) vaccine. One dose is recommended after age 23. Talk to your health care provider about which screenings and vaccines you need and how often you need them. This information is not intended to replace advice given to you by your health care provider. Make sure you discuss any questions you have with your health care provider. Document Released: 10/14/2015 Document Revised: 06/06/2016 Document Reviewed: 07/19/2015 Elsevier Interactive Patient Education  2017 [place] Prevention in the Home Falls can cause injuries. They can happen to people of all ages. There are many things you can do to make your home safe and to help prevent falls. What can I do on the outside of my home?  Regularly fix the edges of walkways and driveways and fix any cracks.  Remove anything that might make you trip as you walk through a door, such as a raised step or threshold.  Trim any bushes or trees on the path to your home.  Use bright outdoor lighting.  Clear any walking paths of anything that might make someone trip, such as rocks or tools.  Regularly check to see if handrails are loose or broken. Make sure that both sides of any steps have handrails.  Any raised decks and porches should have guardrails on the edges.  Have any leaves, snow, or ice cleared regularly.  Use sand or salt on walking paths during winter.  Clean up any spills in your garage right away. This includes oil or grease spills. What can I do in the bathroom?  Use night lights.  Install grab bars by the toilet and in the tub and shower. Do not use towel bars as grab bars.  Use non-skid mats or decals in the tub or shower.  If you need to sit down in the shower, use a plastic, non-slip stool.  Keep the floor dry. Clean up any water that spills on the  floor as soon as it happens.  Remove soap buildup in the tub or shower regularly.  Attach bath mats securely with double-sided non-slip rug tape.  Do not have throw rugs and other things on the floor that can make you trip. What can I do in the bedroom?  Use night lights.  Make sure that you have a light by your bed that is easy to reach.  Do not use any sheets or blankets that are too big for your bed. They should not hang down onto the floor.  Have a firm chair that has side arms. You can use this for support while you get dressed.  Do not have throw rugs and other things on the floor that can make you trip. What can I do in the kitchen?  Clean up any spills right away.  Avoid walking on wet floors.  Keep items that you use a lot in easy-to-reach places.  If you need to reach something above you, use a strong step stool that has a grab bar.  Keep electrical cords out of the way.  Do not use floor polish or wax that makes floors slippery. If you must  use wax, use non-skid floor wax.  Do not have throw rugs and other things on the floor that can make you trip. What can I do with my stairs?  Do not leave any items on the stairs.  Make sure that there are handrails on both sides of the stairs and use them. Fix handrails that are broken or loose. Make sure that handrails are as long as the stairways.  Check any carpeting to make sure that it is firmly attached to the stairs. Fix any carpet that is loose or worn.  Avoid having throw rugs at the top or bottom of the stairs. If you do have throw rugs, attach them to the floor with carpet tape.  Make sure that you have a light switch at the top of the stairs and the bottom of the stairs. If you do not have them, ask someone to add them for you. What else can I do to help prevent falls?  Wear shoes that:  Do not have high heels.  Have rubber bottoms.  Are comfortable and fit you well.  Are closed at the toe. Do not wear  sandals.  If you use a stepladder:  Make sure that it is fully opened. Do not climb a closed stepladder.  Make sure that both sides of the stepladder are locked into place.  Ask someone to hold it for you, if possible.  Clearly mark and make sure that you can see:  Any grab bars or handrails.  First and last steps.  Where the edge of each step is.  Use tools that help you move around (mobility aids) if they are needed. These include:  Canes.  Walkers.  Scooters.  Crutches.  Turn on the lights when you go into a dark area. Replace any light bulbs as soon as they burn out.  Set up your furniture so you have a clear path. Avoid moving your furniture around.  If any of your floors are uneven, fix them.  If there are any pets around you, be aware of where they are.  Review your medicines with your doctor. Some medicines can make you feel dizzy. This can increase your chance of falling. Ask your doctor what other things that you can do to help prevent falls. This information is not intended to replace advice given to you by your health care provider. Make sure you discuss any questions you have with your health care provider. Document Released: 07/14/2009 Document Revised: 02/23/2016 Document Reviewed: 10/22/2014 Elsevier Interactive Patient Education  2017 [employer].

## 2018-08-06 NOTE — Assessment & Plan Note (Signed)
Well controlled Continue Symbicort prn and daily Claritin and Singulair

## 2018-08-06 NOTE — Assessment & Plan Note (Signed)
Bone mineral density will be rechecked with DEXA scan which has been ordered at annual wellness visit We discussed osteoporosis precautions, including regular calcium and vitamin D supplement, regular weightbearing exercise, maintaining healthy weight, avoiding tobacco, and fall precautions We discussed that there are non-bisphosphonate medication options available now and pending her DEXA scan, may consider referral to endocrinology to discuss these options

## 2018-08-06 NOTE — Assessment & Plan Note (Signed)
Well controlled Recheck CMP and FLP Continue losartan/hctz at current dose F/u in 6 months

## 2018-08-06 NOTE — Progress Notes (Signed)
Subjective:   Karen Henson is a 77 y.o. female who presents for Medicare Annual (Subsequent) preventive examination.  Review of Systems:  N/A  Cardiac Risk Factors include: advanced age (>57men, >59 women);hypertension     Objective:     Vitals: BP 116/72 (BP Location: Right Arm)   Pulse 67   Temp 97.9 F (36.6 C) (Oral)   Ht 5\' 2"  (1.575 m)   Wt 127 lb 9.6 oz (57.9 kg)   BMI 23.34 kg/m   Body mass index is 23.34 kg/m.  Advanced Directives 08/06/2018 10/31/2017 07/23/2017 01/30/2016  Does Patient Have a Medical Advance Directive? Yes Yes Yes Yes  Type of Advance Directive Living will;Healthcare Power of [place];Living will [place];Living will [place];Living will  Copy of [place] in Chart? No - copy requested No - copy requested No - copy requested -    Tobacco Social History   Tobacco Use  Smoking Status Former Smoker  . Packs/day: 0.25  . Types: Cigarettes  Smokeless Tobacco Never Used  Tobacco Comment   smoked a pack a week, quit about 30 years ago     Counseling given: Not Answered Comment: smoked a pack a week, quit about 30 years ago   Clinical Intake:  Pre-visit preparation completed: Yes  Pain : No/denies pain Pain Score: 0-No pain     Nutritional Status: BMI of 19-24  Normal Nutritional Risks: None Diabetes: No  How often do you need to have someone help you when you read instructions, pamphlets, or other written materials from your doctor or pharmacy?: 1 - Never  Interpreter Needed?: No  Information entered by :: [facility], LPN  Past Medical History:  Diagnosis Date  . Allergy    Environmental Allergies  . Arthritis   . Bronchitis, chronic ([place])   . Cough    CHRONIC  . Dyspnea    OCCAS  . Hypertension   . 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 Osteoporosis    Past Surgical History:  Procedure Laterality Date  . BREAST EXCISIONAL BIOPSY Left 1996   neg  . CATARACT EXTRACTION W/PHACO Left 10/31/2017   Procedure: CATARACT EXTRACTION PHACO AND INTRAOCULAR LENS PLACEMENT (IOC);  Surgeon: Eulogio Bear, MD;  Location: [facility] ORS;  Service: Ophthalmology;  Laterality: Left;  [place] 00:43.7 AP% 19.6 CDE 8.57 FLUID PACK LOT # [id number]  . CATARACT EXTRACTION W/PHACO Right 12/05/2017   Procedure: CATARACT EXTRACTION PHACO AND INTRAOCULAR LENS PLACEMENT (IOC);  Surgeon: Eulogio Bear, MD;  Location: [facility] ORS;  Service: Ophthalmology;  Laterality: Right;  fluid pack lot #[id number] H  exp 07/01/2019 [place]    00:31.4 AP%    8.8 CDE    2.79  . COLONOSCOPY  02/15/2006  . COLONOSCOPY WITH PROPOFOL N/A 03/21/2016   Procedure: COLONOSCOPY WITH PROPOFOL;  Surgeon: Robert Bellow, MD;  Location: [facility] ENDOSCOPY;  Service: Endoscopy;  Laterality: N/A;  . DILATION AND CURETTAGE OF UTERUS    . VARICOSE VEIN SURGERY     Family History  Problem Relation Age of Onset  . Hypertension Mother   . Emphysema Father   . Stroke Sister   . Stroke Brother   . Diabetes Maternal Aunt   . Diabetes Maternal Grandmother   . Lung cancer Grandchild   . Breast cancer Neg Hx   . Ovarian cancer Neg Hx   . Colon cancer Neg Hx    Social History   Socioeconomic History  . Marital status:  Widowed    Spouse name: Not on file  . Number of children: 2  . Years of education: Not on file  . Highest education level: High school graduate  Occupational History  . Occupation: retired  Scientific laboratory technician  . Financial resource strain: Not hard at all  . Food insecurity:    Worry: Never true    Inability: Never true  . Transportation needs:    Medical: No    Non-medical: No  Tobacco Use  . Smoking status: Former Smoker    Packs/day: 0.25    Types: Cigarettes  . Smokeless tobacco: Never Used  . Tobacco comment: smoked a pack a week, quit about 30 years ago  Substance and Sexual Activity  . Alcohol use: Yes    Comment: ocassional- monthly  . Drug use: No  . Sexual activity:  Never  Lifestyle  . Physical activity:    Days per week: 2 days    Minutes per session: 30 min  . Stress: Not at all  Relationships  . Social connections:    Talks on phone: Patient refused    Gets together: Patient refused    Attends religious service: Patient refused    Active member of club or organization: Patient refused    Attends meetings of clubs or organizations: Patient refused    Relationship status: Patient refused  Other Topics Concern  . Not on file  Social History Narrative  . Not on file    Outpatient Encounter Medications as of 08/06/2018  Medication Sig  . acetaminophen (TYLENOL) 500 MG tablet Take 500 mg by mouth every 6 (six) hours as needed for moderate pain or headache.  . budesonide-formoterol (SYMBICORT) 80-4.5 MCG/ACT inhaler Inhale 2 puffs into the lungs 2 (two) times daily. (Patient taking differently: Inhale 2 puffs into the lungs 2 (two) times daily as needed (for shortness of breath or wheezing). )  . dextromethorphan-guaiFENesin (MUCINEX DM) 30-600 MG 12hr tablet Take 1 tablet by mouth daily.  Marland Kitchen loratadine (CLARITIN) 10 MG tablet Take 10 mg by mouth daily.   Marland Kitchen losartan-hydrochlorothiazide (HYZAAR) 50-12.5 MG tablet TAKE 1 TABLET BY MOUTH ONCE DAILY  . montelukast (SINGULAIR) 10 MG tablet TAKE 1 TABLET BY MOUTH ONCE DAILY AT NIGHT FOR ASTHMA   No facility-administered encounter medications on file as of 08/06/2018.     Activities of Daily Living In your present state of health, do you have any difficulty performing the following activities: 08/06/2018 12/05/2017  Hearing? N N  Vision? N N  Difficulty concentrating or making decisions? N N  Walking or climbing stairs? N N  Dressing or bathing? N N  Doing errands, shopping? N -  Preparing Food and eating ? N -  Using the Toilet? N -  In the past six months, have you accidently leaked urine? N -  Do you have problems with loss of bowel control? N -  Managing your Medications? N -  Managing your  Finances? N -  Housekeeping or managing your Housekeeping? N -  Some recent data might be hidden    Patient Care Team: Virginia Crews, MD as PCP - General (Family Medicine) Bary Castilla, Forest Gleason, MD (General Surgery) Allyne Gee, MD as Consulting Physician (Internal Medicine) [facility], [facility])    Assessment:   This is a routine wellness examination for [place].  Exercise Activities and Dietary recommendations Current Exercise Habits: Home exercise routine, Type of exercise: walking, Time (Minutes): 30, Frequency (Times/Week): 2, Weekly Exercise (Minutes/Week): 60, Intensity:  Mild, Exercise limited by: None identified  Goals    . DIET - REDUCE SUGAR INTAKE     Recommend to monitor sugar intake and avoid sweets in daily diet.        Fall Risk Fall Risk  08/06/2018 05/01/2018 01/27/2018 11/04/2017 10/03/2017  Falls in the past year? 0 No No No No   FALL RISK PREVENTION PERTAINING TO THE HOME:  Any stairs in or around the home WITH handrails? No  Home free of loose throw rugs in walkways, pet beds, electrical cords, etc? Yes  Adequate lighting in your home to reduce risk of falls? Yes   ASSISTIVE DEVICES UTILIZED TO PREVENT FALLS:  Life alert? No  Use of a cane, walker or w/c? No  Grab bars in the bathroom? Yes  Shower chair or bench in shower? Yes  Elevated toilet seat or a handicapped toilet? Yes    TIMED UP AND GO:  Was the test performed? No .     Depression Screen PHQ 2/9 Scores 08/06/2018 05/01/2018 01/27/2018 11/04/2017  PHQ - 2 Score 0 0 0 0     Cognitive Function     6CIT Screen 08/06/2018  What Year? 0 points  What month? 0 points  What time? 0 points  Count back from 20 0 points  Months in reverse 0 points  Repeat phrase 0 points  Total Score 0    Immunization History  Administered Date(s) Administered  . Influenza, High Dose Seasonal PF 07/23/2017, 07/21/2018  . Influenza-Unspecified 08/01/2015  . Pneumococcal Conjugate-13  08/01/2015  . Pneumococcal Polysaccharide-23 07/26/2014  . Td 02/26/2008  . Zoster 03/17/2009  . Zoster Recombinat (Shingrix) 07/21/2018    Qualifies for Shingles Vaccine? Yes  Zostavax completed 03/17/09. Due for Shingrix. Education has been provided regarding the importance of this vaccine. Pt has been advised to call insurance company to determine out of pocket expense. Advised may also receive vaccine at local pharmacy or Health Dept. Verbalized acceptance and understanding.  Tdap: Although this vaccine is not a covered service during a Wellness Exam, does the patient still wish to receive this vaccine today?  No .  Education has been provided regarding the importance of this vaccine. Advised may receive this vaccine at local pharmacy or Health Dept. Aware to provide a copy of the vaccination record if obtained from local pharmacy or Health Dept. Verbalized acceptance and understanding.  Flu Vaccine: Up to date  Pneumococcal Vaccine: Up to date   Screening Tests Health Maintenance  Topic Date Due  . DEXA SCAN  02/19/2018  . TETANUS/TDAP  02/25/2018  . COLONOSCOPY  03/22/2021  . INFLUENZA VACCINE  Completed  . PNA vac Low Risk Adult  Completed    Cancer Screenings:  Colorectal Screening: Completed 03/22/16. Repeat every 5 years.  Mammogram: Completed 07/21/18.   Bone Density: Completed 02/20/16. Results reflect OSTEOPOROSIS. Repeat every 2 years. Ordered today. Pt provided with contact info. Pt aware the office will call re: appt.  Lung Cancer Screening: (Low Dose CT Chest recommended if Age 54-80 years, 30 pack-year currently smoking OR have quit w/in 15years.) does not qualify.    Additional Screening:  Vision Screening: Recommended annual ophthalmology exams for early detection of glaucoma and other disorders of the eye.  Dental Screening: Recommended annual dental exams for proper oral hygiene  Community Resource Referral:  CRR required this visit?  No         Plan:  I have personally reviewed and addressed the Medicare Annual Wellness questionnaire and  have noted the following in the patient's chart:  A. Medical and social history B. Use of alcohol, tobacco or illicit drugs  C. Current medications and supplements D. Functional ability and status E.  Nutritional status F.  Physical activity G. Advance directives H. List of other physicians I.  Hospitalizations, surgeries, and ER visits in previous 12 months J.  [place] such as hearing and vision if needed, cognitive and depression L. Referrals and appointments - none  In addition, I have reviewed and discussed with patient certain preventive protocols, quality metrics, and best practice recommendations. A written personalized care plan for preventive services as well as general preventive health recommendations were provided to patient.  See attached scanned questionnaire for additional information.   Signed,  Fabio Neighbors, LPN Nurse Health Advisor   Nurse Recommendations: Dexa referral sent today. Pt declined the tetanus vaccine today.

## 2018-08-06 NOTE — Progress Notes (Signed)
Patient: Karen Henson Female    DOB: 09-Aug-1941   77 y.o.   MRN: [id number] Visit Date: 08/06/2018  Today's Provider: Lavon Paganini, MD   Chief Complaint  Patient presents with  . Hypertension   Subjective:    I, Tiburcio Pea, CMA, am acting as a scribe for Lavon Paganini, MD.   Patient had a AWE wit [facility] prior to appointment  HPI  Hypertension, follow-up:  BP Readings from Last 3 Encounters:  08/06/18 116/72  08/06/18 116/72  06/09/18 112/80    She was last seen for hypertension 6 months ago.  BP at that visit was 124/80. Management changes since that visit include continue medications. She reports good compliance with treatment. She is not having side effects.  She is exercising. She is adherent to low salt diet.   Outside blood pressures are not being checked at home. She is experiencing none.  Patient denies chest pain, chest pressure/discomfort, claudication, dyspnea, exertional chest pressure/discomfort, fatigue, irregular heart beat, lower extremity edema, near-syncope, orthopnea, palpitations, paroxysmal nocturnal dyspnea, syncope and tachypnea.   Cardiovascular risk factors include advanced age (older than 36 for men, 42 for women) and hypertension.  Use of agents associated with hypertension: none.     Weight trend: stable Wt Readings from Last 3 Encounters:  08/06/18 127 lb 9.6 oz (57.9 kg)  08/06/18 127 lb 9.6 oz (57.9 kg)  06/09/18 128 lb 6.4 oz (58.2 kg)    Current diet: well balanced  ------------------------------------------------------------------------  Osteoporosis: Last BMD in 2017 showed osteoporosis.  Took Fosamax for a few month in 2017 and in 2018. Had significant bone pain and jaw pain when taking it.  DEXA ordered.  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  Has not had any flares recently.  Allergies  Allergen Reactions  . Lisinopril Cough     Current Outpatient Medications:  .  acetaminophen (TYLENOL)  500 MG tablet, Take 500 mg by mouth every 6 (six) hours as needed for moderate pain or headache., Disp: , Rfl:  .  budesonide-formoterol (SYMBICORT) 80-4.5 MCG/ACT inhaler, Inhale 2 puffs into the lungs 2 (two) times daily. (Patient taking differently: Inhale 2 puffs into the lungs 2 (two) times daily as needed (for shortness of breath or wheezing). ), Disp: 1 Inhaler, Rfl: 3 .  dextromethorphan-guaiFENesin (MUCINEX DM) 30-600 MG 12hr tablet, Take 1 tablet by mouth daily., Disp: , Rfl:  .  loratadine (CLARITIN) 10 MG tablet, Take 10 mg by mouth daily. , Disp: , Rfl:  .  losartan-hydrochlorothiazide (HYZAAR) 50-12.5 MG tablet, TAKE 1 TABLET BY MOUTH ONCE DAILY, Disp: 90 tablet, Rfl: 3 .  montelukast (SINGULAIR) 10 MG tablet, TAKE 1 TABLET BY MOUTH ONCE DAILY AT NIGHT FOR ASTHMA, Disp: 30 tablet, Rfl: 11  Review of Systems  Constitutional: Negative.   Respiratory: Positive for shortness of breath.   Cardiovascular: Negative.   Musculoskeletal: Negative.     Social History   Tobacco Use  . Smoking status: Former Smoker    Packs/day: 0.25    Types: Cigarettes  . Smokeless tobacco: Never Used  . Tobacco comment: smoked a pack a week, quit about 30 years ago  Substance Use Topics  . Alcohol use: Yes    Comment: ocassional- monthly   Objective:   BP 116/72 (BP Location: Right Arm, Patient Position: Sitting, Cuff Size: Normal)   Pulse 67   Temp 97.9 F (36.6 C) (Oral)   Ht 5\' 2"  (1.575 m)   Wt 127 lb 9.6 oz (  57.9 kg)   BMI 23.34 kg/m  Vitals:   08/06/18 0926  BP: 116/72  Pulse: 67  Temp: 97.9 F (36.6 C)  TempSrc: Oral  Weight: 127 lb 9.6 oz (57.9 kg)  Height: 5\' 2"  (1.575 m)     Physical Exam  Constitutional: She is oriented to person, place, and time. She appears well-developed and well-nourished. No distress.  HENT:  Head: Normocephalic and atraumatic.  Right Ear: External ear normal.  Left Ear: External ear normal.  Nose: Nose normal.  Mouth/Throat: Oropharynx is  clear and moist.  Eyes: Pupils are equal, round, and reactive to light. Conjunctivae and EOM are normal. No scleral icterus.  Neck: Neck supple. No thyromegaly present.  Cardiovascular: Normal rate, regular rhythm, normal heart sounds and intact distal pulses.  No murmur heard. Pulmonary/Chest: Effort normal and breath sounds normal. No respiratory distress. She has no wheezes. She has no rales.  Abdominal: Soft. Bowel sounds are normal. She exhibits no distension. There is no tenderness. There is no rebound and no guarding.  Musculoskeletal: She exhibits no edema or deformity.  Lymphadenopathy:    She has no cervical adenopathy.  Neurological: She is alert and oriented to person, place, and time.  Skin: Skin is warm and dry. Capillary refill takes less than 2 seconds. No rash noted.  Psychiatric: She has a normal mood and affect. Her behavior is normal.  Vitals reviewed.       Assessment & Plan:   Problem List Items Addressed This Visit      Cardiovascular and Mediastinum   BP (high blood pressure) - Primary    Well controlled Recheck CMP and FLP Continue losartan/hctz at current dose F/u in 6 months      Relevant Medications   losartan-hydrochlorothiazide (HYZAAR) 50-12.5 MG tablet   Other Relevant Orders   Comprehensive metabolic panel   Lipid panel     Respiratory   Fibrosis lung (HCC)    Well controlled Continue Symbicort prn and daily Claritin and Singulair        Musculoskeletal and Integument   OP (osteoporosis)    Bone mineral density will be rechecked with DEXA scan which has been ordered at annual wellness visit We discussed osteoporosis precautions, including regular calcium and vitamin D supplement, regular weightbearing exercise, maintaining healthy weight, avoiding tobacco, and fall precautions We discussed that there are non-bisphosphonate medication options available now and pending her DEXA scan, may consider referral to endocrinology to discuss these  options       Other Visit Diagnoses    Screening for lipid disorders       Relevant Orders   Lipid panel       Return in about 6 months (around 02/04/2019) for BP f/u.   The entirety of the information documented in the History of Present Illness, Review of Systems and Physical Exam were personally obtained by me. Portions of this information were initially documented by Tiburcio Pea, CMA and reviewed by me for thoroughness and accuracy.    Virginia Crews, MD, MPH [facility] 08/06/2018 10:14 AM

## 2018-08-06 NOTE — Patient Instructions (Signed)
Preventive Care 65 Years and Older, Female Preventive care refers to lifestyle choices and visits with your health care provider that can promote health and wellness. What does preventive care include?  A yearly physical exam. This is also called an annual well check.  Dental exams once or twice a year.  Routine eye exams. Ask your health care provider how often you should have your eyes checked.  Personal lifestyle choices, including: ? Daily care of your teeth and gums. ? Regular physical activity. ? Eating a healthy diet. ? Avoiding tobacco and drug use. ? Limiting alcohol use. ? Practicing safe sex. ? Taking low-dose aspirin every day. ? Taking vitamin and mineral supplements as recommended by your health care provider. What happens during an annual well check? The services and screenings done by your health care provider during your annual well check will depend on your age, overall health, lifestyle risk factors, and family history of disease. Counseling Your health care provider may ask you questions about your:  Alcohol use.  Tobacco use.  Drug use.  Emotional well-being.  Home and relationship well-being.  Sexual activity.  Eating habits.  History of falls.  Memory and ability to understand (cognition).  Work and work environment.  Reproductive health.  Screening You may have the following tests or measurements:  Height, weight, and BMI.  Blood pressure.  Lipid and cholesterol levels. These may be checked every 5 years, or more frequently if you are over 50 years old.  Skin check.  Lung cancer screening. You may have this screening every year starting at age 55 if you have a 30-pack-year history of smoking and currently smoke or have quit within the past 15 years.  Fecal occult blood test (FOBT) of the stool. You may have this test every year starting at age 50.  Flexible sigmoidoscopy or colonoscopy. You may have a sigmoidoscopy every 5 years or  a colonoscopy every 10 years starting at age 50.  Hepatitis C blood test.  Hepatitis B blood test.  Sexually transmitted disease (STD) testing.  Diabetes screening. This is done by checking your blood sugar (glucose) after you have not eaten for a while (fasting). You may have this done every 1-3 years.  Bone density scan. This is done to screen for osteoporosis. You may have this done starting at age 65.  Mammogram. This may be done every 1-2 years. Talk to your health care provider about how often you should have regular mammograms.  Talk with your health care provider about your test results, treatment options, and if necessary, the need for more tests. Vaccines Your health care provider may recommend certain vaccines, such as:  Influenza vaccine. This is recommended every year.  Tetanus, diphtheria, and acellular pertussis (Tdap, Td) vaccine. You may need a Td booster every 10 years.  Varicella vaccine. You may need this if you have not been vaccinated.  Zoster vaccine. You may need this after age 60.  Measles, mumps, and rubella (MMR) vaccine. You may need at least one dose of MMR if you were born in 1957 or later. You may also need a second dose.  Pneumococcal 13-valent conjugate (PCV13) vaccine. One dose is recommended after age 65.  Pneumococcal polysaccharide (PPSV23) vaccine. One dose is recommended after age 65.  Meningococcal vaccine. You may need this if you have certain conditions.  Hepatitis A vaccine. You may need this if you have certain conditions or if you travel or work in places where you may be exposed to hepatitis   A.  Hepatitis B vaccine. You may need this if you have certain conditions or if you travel or work in places where you may be exposed to hepatitis B.  Haemophilus influenzae type b (Hib) vaccine. You may need this if you have certain conditions.  Talk to your health care provider about which screenings and vaccines you need and how often you  need them. This information is not intended to replace advice given to you by your health care provider. Make sure you discuss any questions you have with your health care provider. Document Released: 10/14/2015 Document Revised: 06/06/2016 Document Reviewed: 07/19/2015 Elsevier Interactive Patient Education  2018 Elsevier Inc.  

## 2018-10-22 ENCOUNTER — Other Ambulatory Visit: Payer: Medicare Other

## 2018-11-10 ENCOUNTER — Ambulatory Visit (INDEPENDENT_AMBULATORY_CARE_PROVIDER_SITE_OTHER): Payer: Medicare Other | Admitting: Internal Medicine

## 2018-11-10 ENCOUNTER — Encounter: Payer: Self-pay | Admitting: Internal Medicine

## 2018-11-10 VITALS — BP 110/92 | HR 62 | Resp 16 | Ht 62.0 in | Wt 124.0 lb

## 2018-11-10 DIAGNOSIS — I10 Essential (primary) hypertension: Secondary | ICD-10-CM

## 2018-11-10 DIAGNOSIS — J84112 Idiopathic pulmonary fibrosis: Secondary | ICD-10-CM | POA: Diagnosis not present

## 2018-11-10 DIAGNOSIS — J449 Chronic obstructive pulmonary disease, unspecified: Secondary | ICD-10-CM

## 2018-11-10 DIAGNOSIS — R0602 Shortness of breath: Secondary | ICD-10-CM

## 2018-11-10 NOTE — Progress Notes (Signed)
[facility] [place], [place] [address]  Pulmonary Sleep Medicine   Office Visit Note  Patient Name: Karen Henson DOB: 10/10/1940 MRN [id number]  Date of Service: 11/10/2018  Complaints/HPI: Patient is here for follow-up on COPD and idiopathic pulmonary fibrosis.  Generally the patient is doing well and denies any shortness of breath or breathe difficulty.  She denies any chest pain, fevers, sinus issues or congestion.  She denies any recent hospitalization.  She has been using her inhalers as needed.  ROS  General: (-) fever, (-) chills, (-) night sweats, (-) weakness Skin: (-) rashes, (-) itching,. Eyes: (-) visual changes, (-) redness, (-) itching. Nose and Sinuses: (-) nasal stuffiness or itchiness, (-) postnasal drip, (-) nosebleeds, (-) sinus trouble. Mouth and Throat: (-) sore throat, (-) hoarseness. Neck: (-) swollen glands, (-) enlarged thyroid, (-) neck pain. Respiratory: - cough, (-) bloody sputum, - shortness of breath, - wheezing. Cardiovascular: - ankle swelling, (-) chest pain. Lymphatic: (-) lymph node enlargement. Neurologic: (-) numbness, (-) tingling. Psychiatric: (-) anxiety, (-) depression   Current Medication: Outpatient Encounter Medications as of 11/10/2018  Medication Sig  . acetaminophen (TYLENOL) 500 MG tablet Take 500 mg by mouth every 6 (six) hours as needed for moderate pain or headache.  . budesonide-formoterol (SYMBICORT) 80-4.5 MCG/ACT inhaler Inhale 2 puffs into the lungs 2 (two) times daily. (Patient taking differently: Inhale 2 puffs into the lungs 2 (two) times daily as needed (for shortness of breath or wheezing). )  . dextromethorphan-guaiFENesin (MUCINEX DM) 30-600 MG 12hr tablet Take 1 tablet by mouth daily.  Marland Kitchen loratadine (CLARITIN) 10 MG tablet Take 10 mg by mouth daily.   Marland Kitchen losartan-hydrochlorothiazide (HYZAAR) 50-12.5 MG tablet Take 1 tablet by mouth daily.  . montelukast (SINGULAIR) 10 MG tablet TAKE 1  TABLET BY MOUTH ONCE DAILY AT NIGHT FOR ASTHMA   No facility-administered encounter medications on file as of 11/10/2018.     Surgical History: Past Surgical History:  Procedure Laterality Date  . BREAST EXCISIONAL BIOPSY Left 1996   neg  . CATARACT EXTRACTION W/PHACO Left 10/31/2017   Procedure: CATARACT EXTRACTION PHACO AND INTRAOCULAR LENS PLACEMENT (IOC);  Surgeon: Eulogio Bear, MD;  Location: [facility] ORS;  Service: Ophthalmology;  Laterality: Left;  [place] 00:43.7 AP% 19.6 CDE 8.57 FLUID PACK LOT # [id number]  . CATARACT EXTRACTION W/PHACO Right 12/05/2017   Procedure: CATARACT EXTRACTION PHACO AND INTRAOCULAR LENS PLACEMENT (IOC);  Surgeon: Eulogio Bear, MD;  Location: [facility] ORS;  Service: Ophthalmology;  Laterality: Right;  fluid pack lot #[id number] H  exp 07/01/2019 [place]    00:31.4 AP%    8.8 CDE    2.79  . COLONOSCOPY  02/15/2006  . COLONOSCOPY WITH PROPOFOL N/A 03/21/2016   Procedure: COLONOSCOPY WITH PROPOFOL;  Surgeon: Robert Bellow, MD;  Location: [facility] ENDOSCOPY;  Service: Endoscopy;  Laterality: N/A;  . DILATION AND CURETTAGE OF UTERUS    . VARICOSE VEIN SURGERY      Medical History: Past Medical History:  Diagnosis Date  . Allergy    Environmental Allergies  . Arthritis   . Bronchitis, chronic ([place])   . Cough    CHRONIC  . Dyspnea    OCCAS  . Hypertension   . 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[place])   . Osteoporosis     Family History: Family History  Problem Relation Age of Onset  . Hypertension Mother   . Emphysema Father   . Stroke Sister   . Stroke Brother   . Diabetes Maternal  Aunt   . Diabetes Maternal Grandmother   . Lung cancer Grandchild   . Breast cancer Neg Hx   . Ovarian cancer Neg Hx   . Colon cancer Neg Hx     Social History: Social History   Socioeconomic History  . Marital status: Widowed    Spouse name: Not on file  . Number of children: 2  . Years of education: Not on file  . Highest education level: High school graduate  Occupational History   . Occupation: retired  Scientific laboratory technician  . Financial resource strain: Not hard at all  . Food insecurity:    Worry: Never true    Inability: Never true  . Transportation needs:    Medical: No    Non-medical: No  Tobacco Use  . Smoking status: Former Smoker    Packs/day: 0.25    Types: Cigarettes  . Smokeless tobacco: Never Used  . Tobacco comment: smoked a pack a week, quit about 30 years ago  Substance and Sexual Activity  . Alcohol use: Yes    Comment: ocassional- monthly  . Drug use: No  . Sexual activity: Never  Lifestyle  . Physical activity:    Days per week: 2 days    Minutes per session: 30 min  . Stress: Not at all  Relationships  . Social connections:    Talks on phone: Patient refused    Gets together: Patient refused    Attends religious service: Patient refused    Active member of club or organization: Patient refused    Attends meetings of clubs or organizations: Patient refused    Relationship status: Patient refused  . Intimate partner violence:    Fear of current or ex partner: Patient refused    Emotionally abused: Patient refused    Physically abused: Patient refused    Forced sexual activity: Patient refused  Other Topics Concern  . Not on file  Social History Narrative  . Not on file    Vital Signs: Blood pressure (!) 110/92, pulse 62, resp. rate 16, height 5\' 2"  (1.575 m), weight 124 lb (56.2 kg), SpO2 94 %.  Examination: General Appearance: The patient is well-developed, well-nourished, and in no distress. Skin: Gross inspection of skin unremarkable. Head: normocephalic, no gross deformities. Eyes: no gross deformities noted. ENT: ears appear grossly normal no exudates. Neck: Supple. No thyromegaly. No LAD. Respiratory: clear bilaterally. Cardiovascular: Normal S1 and S2 without murmur or rub. Extremities: No cyanosis. pulses are equal. Neurologic: Alert and oriented. No involuntary movements.  LABS: No results found for this or any  previous visit (from the past 2160 hour(s)).  Radiology: Mm 3d Screen Breast Bilateral  Result Date: 07/21/2018 CLINICAL DATA:  Screening. EXAM: DIGITAL SCREENING BILATERAL MAMMOGRAM WITH TOMO AND CAD COMPARISON:  Previous exam(s). ACR Breast Density Category c: The breast tissue is heterogeneously dense, which may obscure small masses. FINDINGS: There are no findings suspicious for malignancy. Images were processed with CAD. IMPRESSION: No mammographic evidence of malignancy. A result letter of this screening mammogram will be mailed directly to the patient. RECOMMENDATION: Screening mammogram in one year. (Code:SM-B-01Y) BI-RADS CATEGORY  1: Negative. Electronically Signed   By: Fidela Salisbury M.D.   On: 07/21/2018 10:51    No results found.  No results found.    Assessment and Plan: Patient Active Problem List   Diagnosis Date Noted  . Chronic cough 08/05/2017  . 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 History of adenomatous polyp of colon  05/30/2016  . Allergic rhinitis 11/09/2015  . 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 BP (high blood pressure) 11/09/2015  . Fibrosis lung ([place]) 11/09/2015  . 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 OP (osteoporosis) 11/09/2015    1. Chronic obstructive pulmonary disease, unspecified COPD type ([place]) Stable continue current regimen of inhalers.  2. IPF (idiopathic pulmonary fibrosis) ([place]) Overall patient is doing well.  She is off medications currently and continues to deny any overt symptoms.  Continue to follow-up in office in 6 months.  3. Essential hypertension Repeat BP 110/88.  Continue to follow at future visits.  4. SOB (shortness of breath) FVC is 1.9 which is 75% of the pre-predicted value FEV1 is 1.1 which is 58% of the predicted value FEV1-FVC is 58% which is 77% of the pre-predicted value on today spirometry - Spirometry with Graph  General Counseling: I have discussed the findings of the  evaluation and examination with Karen Henson.  I have also discussed any further diagnostic evaluation thatmay be needed or ordered today. Karen Henson verbalizes understanding of the findings of todays visit. We also reviewed her medications today and discussed drug interactions and side effects including but not limited excessive drowsiness and altered mental states. We also discussed that there is always a risk not just to her but also people around her. she has been encouraged to call the office with any questions or concerns that should arise related to todays visit.    Time spent: 25 This patient was seen by Orson Gear AGNP-C in Collaboration with Dr. Devona Konig as a part of collaborative care agreement.    I have personally obtained a history, examined the patient, evaluated laboratory and imaging results, formulated the assessment and plan and placed orders.    Allyne Gee, MD [facility] Pulmonary and Critical Care Sleep medicine

## 2018-11-10 NOTE — Patient Instructions (Signed)
Chronic Obstructive Pulmonary Disease Chronic obstructive pulmonary disease (COPD) is a long-term (chronic) lung problem. When you have COPD, it is hard for air to get in and out of your lungs. Usually the condition gets worse over time, and your lungs will never return to normal. There are things you can do to keep yourself as healthy as possible.  Your doctor may treat your condition with: ? Medicines. ? Oxygen. ? Lung surgery.  Your doctor may also recommend: ? Rehabilitation. This includes steps to make your body work better. It may involve a team of specialists. ? Quitting smoking, if you smoke. ? Exercise and changes to your diet. ? Comfort measures (palliative care). Follow these instructions at home: Medicines  Take over-the-counter and prescription medicines only as told by your doctor.  Talk to your doctor before taking any cough or allergy medicines. You may need to avoid medicines that cause your lungs to be dry. Lifestyle  If you smoke, stop. Smoking makes the problem worse. If you need help quitting, ask your doctor.  Avoid being around things that make your breathing worse. This may include smoke, chemicals, and fumes.  Stay active, but remember to rest as well.  Learn and use tips on how to relax.  Make sure you get enough sleep. Most adults need at least 7 hours of sleep every night.  Eat healthy foods. Eat smaller meals more often. Rest before meals. Controlled breathing Learn and use tips on how to control your breathing as told by your doctor. Try:  Breathing in (inhaling) through your nose for 1 second. Then, pucker your lips and breath out (exhale) through your lips for 2 seconds.  Putting one hand on your belly (abdomen). Breathe in slowly through your nose for 1 second. Your hand on your belly should move out. Pucker your lips and breathe out slowly through your lips. Your hand on your belly should move in as you breathe out.  Controlled coughing Learn  and use controlled coughing to clear mucus from your lungs. Follow these steps: 1. Lean your head a little forward. 2. Breathe in deeply. 3. Try to hold your breath for 3 seconds. 4. Keep your mouth slightly open while coughing 2 times. 5. Spit any mucus out into a tissue. 6. Rest and do the steps again 1 or 2 times as needed. General instructions  Make sure you get all the shots (vaccines) that your doctor recommends. Ask your doctor about a flu shot and a pneumonia shot.  Use oxygen therapy and pulmonary rehabilitation if told by your doctor. If you need home oxygen therapy, ask your doctor if you should buy a tool to measure your oxygen level (oximeter).  Make a COPD action plan with your doctor. This helps you to know what to do if you feel worse than usual.  Manage any other conditions you have as told by your doctor.  Avoid going outside when it is very hot, cold, or humid.  Avoid people who have a sickness you can catch (contagious).  Keep all follow-up visits as told by your doctor. This is important. Contact a doctor if:  You cough up more mucus than usual.  There is a change in the color or thickness of the mucus.  It is harder to breathe than usual.  Your breathing is faster than usual.  You have trouble sleeping.  You need to use your medicines more often than usual.  You have trouble doing your normal activities such as getting dressed   or walking around the house. Get help right away if:  You have shortness of breath while resting.  You have shortness of breath that stops you from: ? Being able to talk. ? Doing normal activities.  Your chest hurts for longer than 5 minutes.  Your skin color is more blue than usual.  Your pulse oximeter shows that you have low oxygen for longer than 5 minutes.  You have a fever.  You feel too tired to breathe normally. Summary  Chronic obstructive pulmonary disease (COPD) is a long-term lung problem.  The way your  lungs work will never return to normal. Usually the condition gets worse over time. There are things you can do to keep yourself as healthy as possible.  Take over-the-counter and prescription medicines only as told by your doctor.  If you smoke, stop. Smoking makes the problem worse. This information is not intended to replace advice given to you by your health care provider. Make sure you discuss any questions you have with your health care provider. Document Released: 03/05/2008 Document Revised: 10/22/2016 Document Reviewed: 10/22/2016 Elsevier Interactive Patient Education  2019 Elsevier Inc.  

## 2018-11-15 ENCOUNTER — Encounter: Payer: Self-pay | Admitting: Family Medicine

## 2018-11-15 ENCOUNTER — Ambulatory Visit (INDEPENDENT_AMBULATORY_CARE_PROVIDER_SITE_OTHER): Payer: Medicare Other | Admitting: Family Medicine

## 2018-11-15 VITALS — BP 128/86 | HR 80 | Temp 98.6°F | Resp 16 | Wt 124.0 lb

## 2018-11-15 DIAGNOSIS — N390 Urinary tract infection, site not specified: Secondary | ICD-10-CM | POA: Diagnosis not present

## 2018-11-15 LAB — POCT URINALYSIS DIPSTICK
BILIRUBIN UA: NEGATIVE
GLUCOSE UA: NEGATIVE
Ketones, UA: NEGATIVE
Nitrite, UA: NEGATIVE
PH UA: 8 (ref 5.0–8.0)
Protein, UA: POSITIVE — AB
SPEC GRAV UA: 1.01 (ref 1.010–1.025)
UROBILINOGEN UA: 0.2 U/dL

## 2018-11-15 MED ORDER — CEPHALEXIN 500 MG PO CAPS: 500.0000 mg | ORAL_CAPSULE | Freq: Two times a day (BID) | ORAL | 0 refills | Status: DC

## 2018-11-15 NOTE — Patient Instructions (Addendum)
. Please review the attached list of medications and notify my office if there are any errors.   . Please bring all of your medications to every appointment so we can make sure that our medication list is the same as yours.   Urinary Tract Infection, Adult  A urinary tract infection (UTI) is an infection of any part of the urinary tract. The urinary tract includes:  The kidneys.  The ureters.  The bladder.  The urethra. These organs make, store, and get rid of pee (urine) in the body. What are the causes? This is caused by germs (bacteria) in your genital area. These germs grow and cause swelling (inflammation) of your urinary tract. What increases the risk? You are more likely to develop this condition if:  You have a small, thin tube (catheter) to drain pee.  You cannot control when you pee or poop (incontinence).  You are female, and: ? You use these methods to prevent pregnancy: ? A medicine that kills sperm (spermicide). ? A device that blocks sperm (diaphragm). ? You have low levels of a female hormone (estrogen). ? You are pregnant.  You have genes that add to your risk.  You are sexually active.  You take antibiotic medicines.  You have trouble peeing because of: ? A prostate that is bigger than normal, if you are female. ? A blockage in the part of your body that drains pee from the bladder (urethra). ? A kidney stone. ? A nerve condition that affects your bladder (neurogenic bladder). ? Not getting enough to drink. ? Not peeing often enough.  You have other conditions, such as: ? Diabetes. ? A weak disease-fighting system (immune system). ? Sickle cell disease. ? Gout. ? Injury of the spine. What are the signs or symptoms? Symptoms of this condition include:  Needing to pee right away (urgently).  Peeing often.  Peeing small amounts often.  Pain or burning when peeing.  Blood in the pee.  Pee that smells bad or not like normal.  Trouble  peeing.  Pee that is cloudy.  Fluid coming from the vagina, if you are female.  Pain in the belly or lower back. Other symptoms include:  Throwing up (vomiting).  No urge to eat.  Feeling mixed up (confused).  Being tired and grouchy (irritable).  A fever.  Watery poop (diarrhea). How is this treated? This condition may be treated with:  Antibiotic medicine.  Other medicines.  Drinking enough water. Follow these instructions at home:  Medicines  Take over-the-counter and prescription medicines only as told by your doctor.  If you were prescribed an antibiotic medicine, take it as told by your doctor. Do not stop taking it even if you start to feel better. General instructions  Make sure you: ? Pee until your bladder is empty. ? Do not hold pee for a long time. ? Empty your bladder after sex. ? Wipe from front to back after pooping if you are a female. Use each tissue one time when you wipe.  Drink enough fluid to keep your pee pale yellow.  Keep all follow-up visits as told by your doctor. This is important. Contact a doctor if:  You do not get better after 1-2 days.  Your symptoms go away and then come back. Get help right away if:  You have very bad back pain.  You have very bad pain in your lower belly.  You have a fever.  You are sick to your stomach (nauseous).  You  are throwing up. Summary  A urinary tract infection (UTI) is an infection of any part of the urinary tract.  This condition is caused by germs in your genital area.  There are many risk factors for a UTI. These include having a small, thin tube to drain pee and not being able to control when you pee or poop.  Treatment includes antibiotic medicines for germs.  Drink enough fluid to keep your pee pale yellow. This information is not intended to replace advice given to you by your health care provider. Make sure you discuss any questions you have with your health care  provider. Document Released: 03/05/2008 Document Revised: 03/27/2018 Document Reviewed: 03/27/2018 Elsevier Interactive Patient Education  2019 [employer].

## 2018-11-15 NOTE — Progress Notes (Signed)
_    Patient: Karen Henson Female    DOB: 11-11-1940   78 y.o.   MRN: [id number] Visit Date: 11/15/2018  Today's Provider: Lelon Huh, MD   Chief Complaint  Patient presents with  . Urinary Tract Infection   Subjective:     HPI Urinary Tract Infection: Patient complains of burning with urination She has had symptoms for 1 day. Patient also complains of none. Patient denies fever, stomach ache and vaginal discharge. Patient does not have a history of recurrent UTI.  Patient does not have a history of pyelonephritis.   Allergies  Allergen Reactions  . Lisinopril Cough     Current Outpatient Medications:  .  acetaminophen (TYLENOL) 500 MG tablet, Take 500 mg by mouth every 6 (six) hours as needed for moderate pain or headache., Disp: , Rfl:  .  budesonide-formoterol (SYMBICORT) 80-4.5 MCG/ACT inhaler, Inhale 2 puffs into the lungs 2 (two) times daily. (Patient taking differently: Inhale 2 puffs into the lungs 2 (two) times daily as needed (for shortness of breath or wheezing). ), Disp: 1 Inhaler, Rfl: 3 .  dextromethorphan-guaiFENesin (MUCINEX DM) 30-600 MG 12hr tablet, Take 1 tablet by mouth daily., Disp: , Rfl:  .  loratadine (CLARITIN) 10 MG tablet, Take 10 mg by mouth daily. , Disp: , Rfl:  .  losartan-hydrochlorothiazide (HYZAAR) 50-12.5 MG tablet, Take 1 tablet by mouth daily., Disp: 90 tablet, Rfl: 3 .  montelukast (SINGULAIR) 10 MG tablet, TAKE 1 TABLET BY MOUTH ONCE DAILY AT NIGHT FOR ASTHMA, Disp: 30 tablet, Rfl: 11  Review of Systems  Constitutional: Negative.   Respiratory: Negative.   Cardiovascular: Negative.   Genitourinary: Positive for dysuria and frequency. Negative for flank pain, hematuria and vaginal discharge.    Social History   Tobacco Use  . Smoking status: Former Smoker    Packs/day: 0.25    Types: Cigarettes  . Smokeless tobacco: Never Used  . Tobacco comment: smoked a pack a week, quit about 30 years ago  Substance Use Topics  . Alcohol  use: Yes    Comment: ocassional- monthly      Objective:   BP 128/86 (BP Location: Right Arm, Patient Position: Sitting, Cuff Size: Normal)   Pulse 80   Temp 98.6 F (37 C) (Oral)   Resp 16   Wt 124 lb (56.2 kg)   BMI 22.68 kg/m  Vitals:   11/15/18 0914  BP: 128/86  Pulse: 80  Resp: 16  Temp: 98.6 F (37 C)  TempSrc: Oral  Weight: 124 lb (56.2 kg)     Physical Exam  General appearance: alert, well developed, well nourished, cooperative and in no distress Head: Normocephalic, without obvious abnormality, atraumatic Respiratory: Respirations even and unlabored, normal respiratory rate   Results for orders placed or performed in visit on 11/15/18  POCT urinalysis dipstick  Result Value Ref Range   Color, UA yellow    Clarity, UA dark    Glucose, UA Negative Negative   Bilirubin, UA Negative    Ketones, UA Negative    Spec Grav, UA 1.010 1.010 - 1.025   Blood, UA Large    pH, UA 8.0 5.0 - 8.0   Protein, UA Positive (A) Negative   Urobilinogen, UA 0.2 0.2 or 1.0 E.U./dL   Nitrite, UA Negative    Leukocytes, UA Large (3+) (A) Negative   Appearance     Odor         Assessment & Plan    1. Recurrent UTI  -  cephALEXin (KEFLEX) 500 MG capsule; Take 1 capsule (500 mg total) by mouth 2 (two) times daily for 5 days.  Dispense: 10 capsule; Refill: 0 - Urine Culture     Lelon Huh, MD  [facility]

## 2018-11-16 NOTE — Addendum Note (Signed)
Addended by: Devona Konig on: 11/16/2018 09:49 PM   Modules accepted: Level of Service

## 2018-11-18 ENCOUNTER — Telehealth: Payer: Self-pay

## 2018-11-18 LAB — URINE CULTURE

## 2018-11-18 NOTE — Telephone Encounter (Signed)
Pt advised.   Thanks,   -Karen Henson  

## 2018-11-18 NOTE — Telephone Encounter (Signed)
-----   Message from Birdie Sons, MD sent at 11/18/2018  1:41 PM EST ----- Urine culture shows infection sensitive to antibiotic that was prescribed. Symptoms should completely resolve by the time antibiotic is finished. Call back otherwise.

## 2018-11-21 ENCOUNTER — Telehealth: Payer: Self-pay

## 2018-11-21 MED ORDER — AMOXICILLIN-POT CLAVULANATE 875-125 MG PO TABS: 1.0000 | ORAL_TABLET | Freq: Two times a day (BID) | ORAL | 0 refills | Status: AC

## 2018-11-21 NOTE — Telephone Encounter (Signed)
Patient reports that she seen Dr. Caryn Section for a UTI and reports that she is still having symptoms. Patient is requesting that Dr. Caryn Section send in another round of abx. [facility]. Contact info is correct. Thanks!

## 2018-11-21 NOTE — Telephone Encounter (Signed)
Patient advised.

## 2018-11-21 NOTE — Telephone Encounter (Signed)
Prescription sent to [facility]. Changed to Augmentin. Infection may have become resistant to cephalexin.

## 2018-12-12 IMAGING — MG MM DIGITAL SCREENING BILAT W/ TOMO W/ CAD
6 of 10 series · 6 of 30 positions shown · non-contrast
Comparison: Previous exam(s).

CLINICAL DATA: Screening.

EXAM:
DIGITAL SCREENING BILATERAL MAMMOGRAM WITH TOMO AND CAD

[R MLO synth-2D]
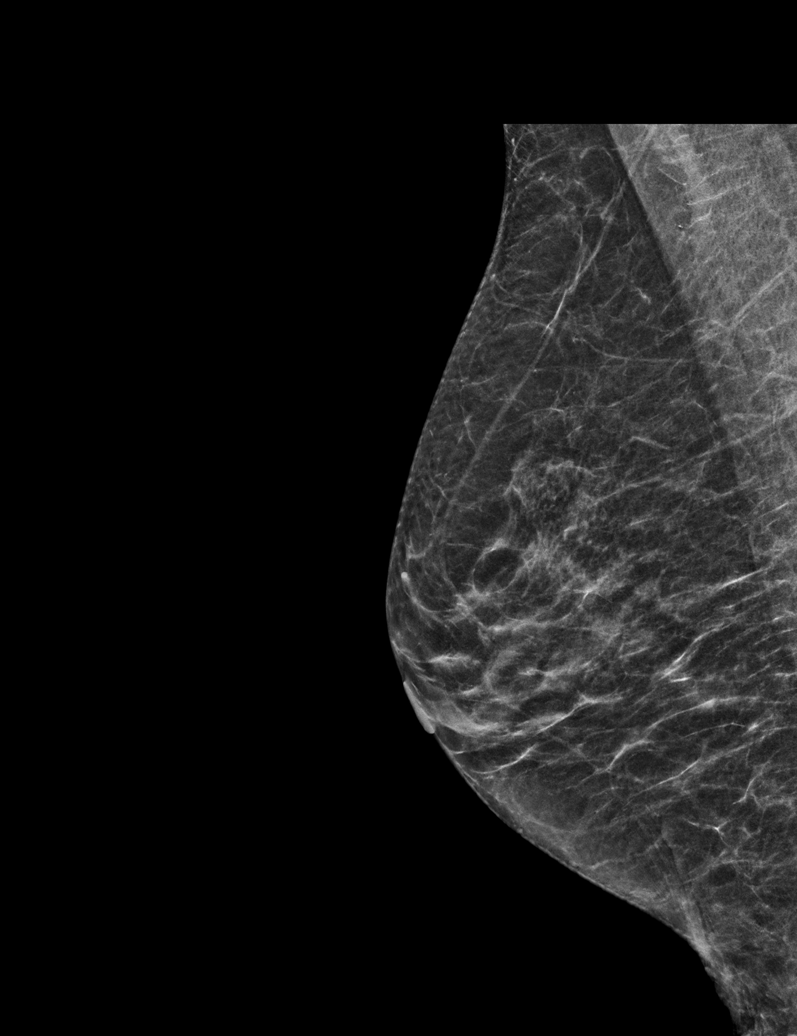

[L MLO synth-2D (1 of 2)]
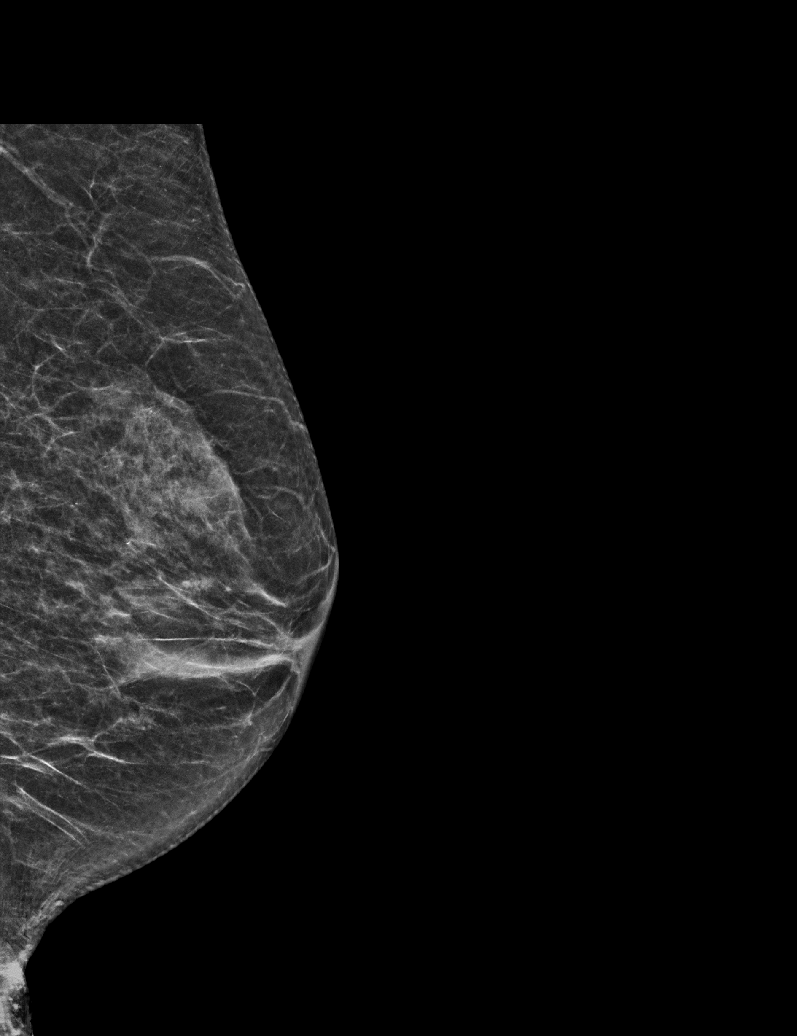

[L CC synth-2D]
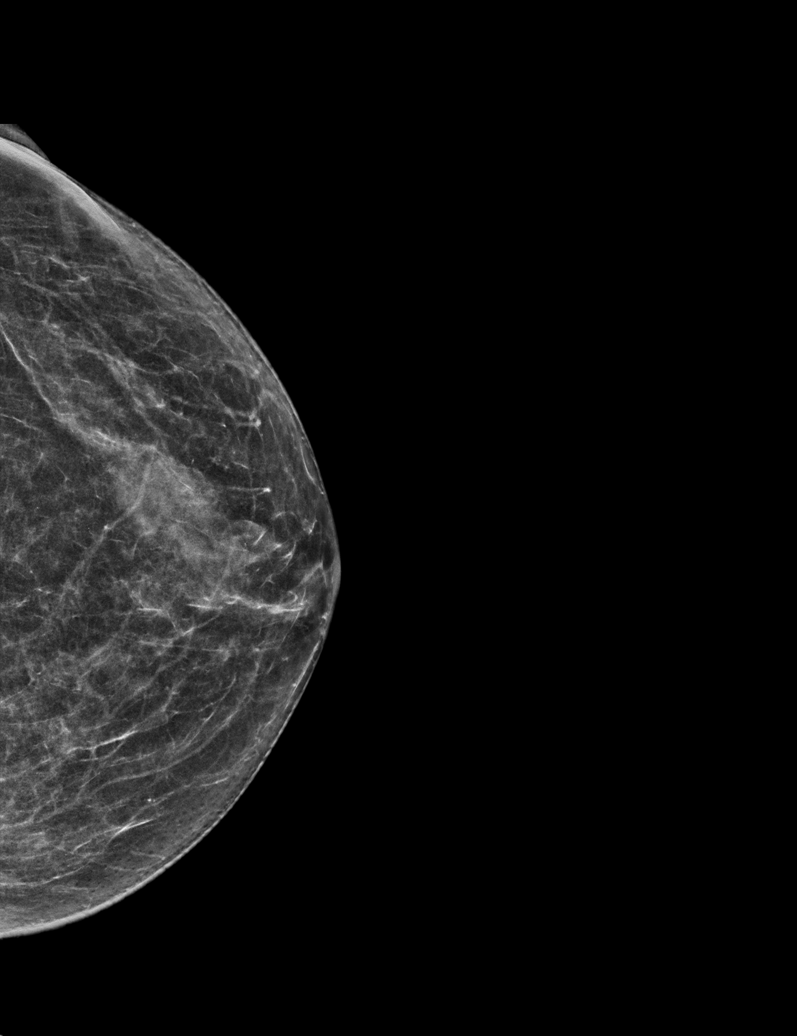

[L MLO synth-2D (2 of 2)]
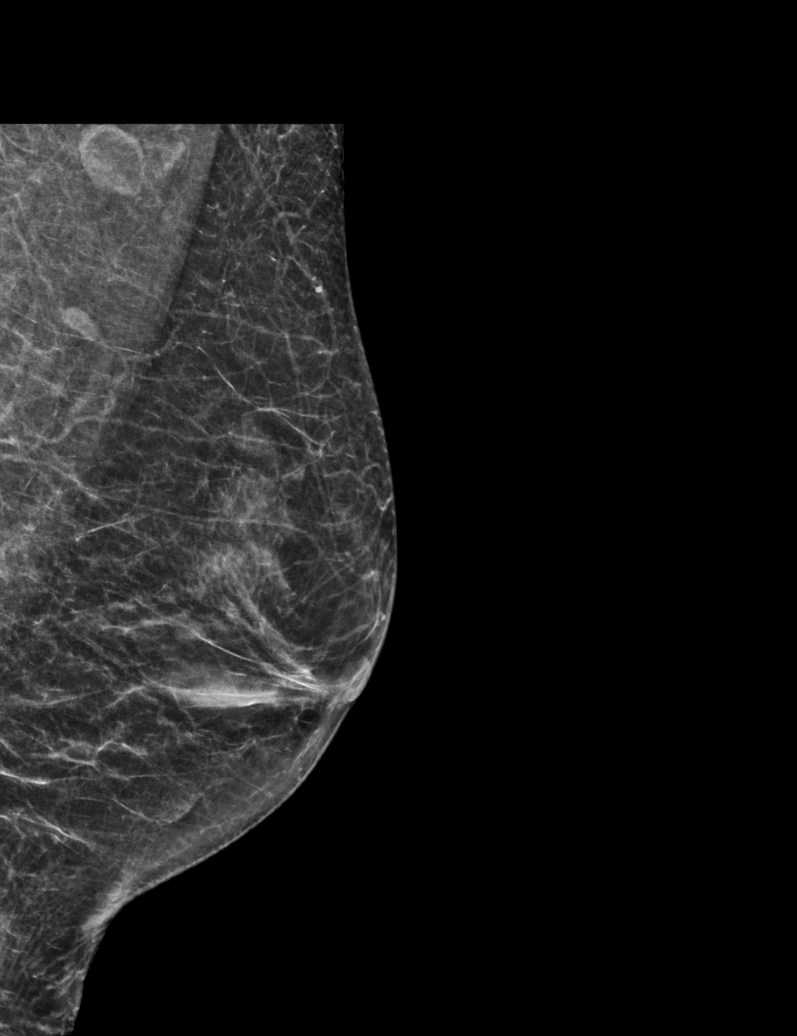

[R CC synth-2D]
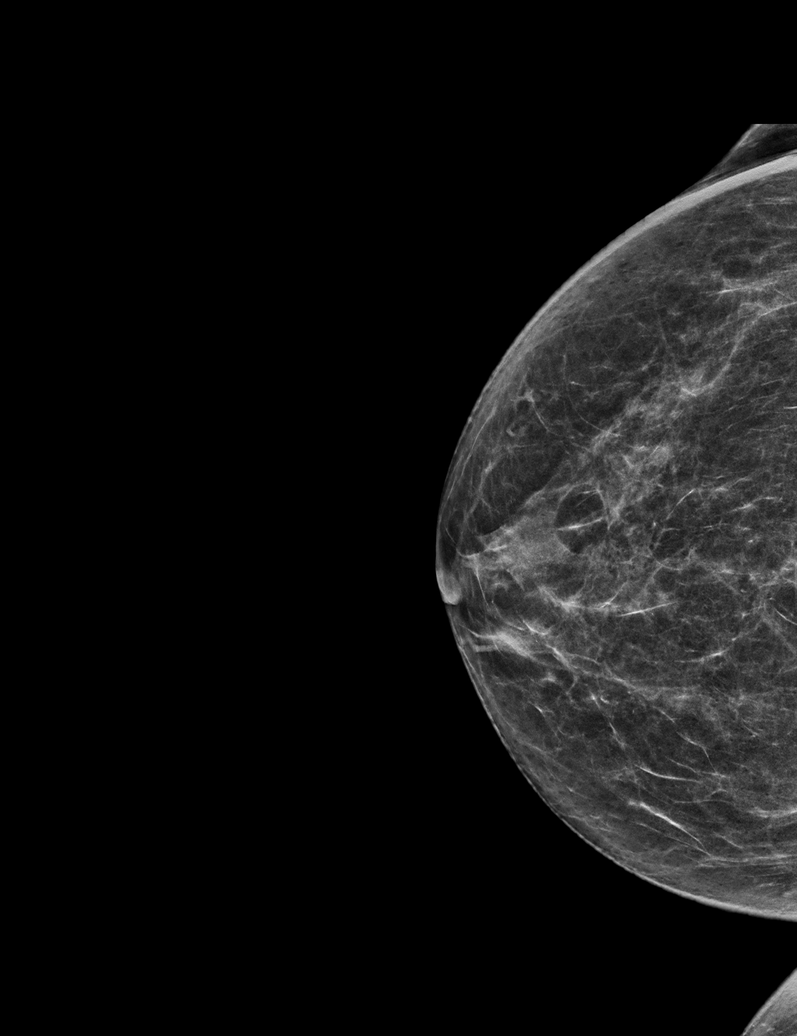

[L CC tomo · tomo slice 27/53.0]
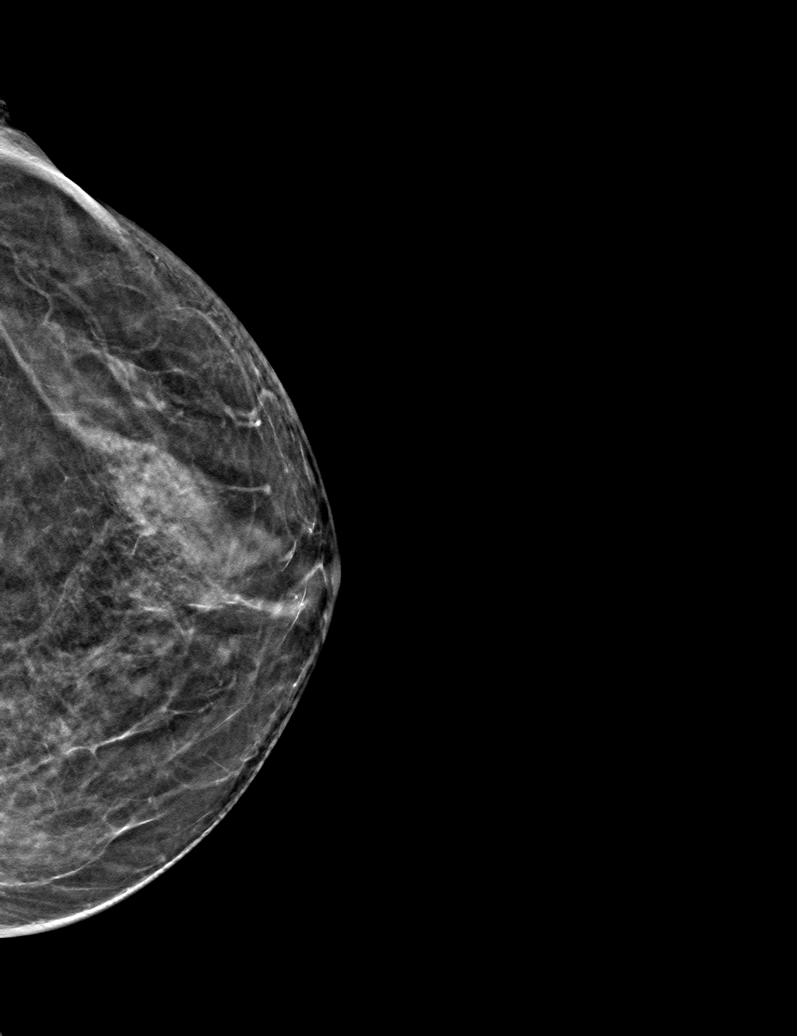

[6 of 30 positions shown; findings below may reference images not displayed]

ACR Breast Density Category c: The breast tissue is heterogeneously
dense, which may obscure small masses.
FINDINGS: There are no findings suspicious for malignancy. Images were
processed with CAD.
IMPRESSION: No mammographic evidence of malignancy. A result letter of this
screening mammogram will be mailed directly to the patient.

RECOMMENDATION:
Screening mammogram in one year. (Code:[id number]-[id number])

BI-RADS CATEGORY  1: Negative.

## 2019-01-09 ENCOUNTER — Other Ambulatory Visit: Payer: Self-pay | Admitting: Internal Medicine

## 2019-02-04 ENCOUNTER — Ambulatory Visit: Payer: Medicare Other | Admitting: Family Medicine

## 2019-03-06 ENCOUNTER — Encounter: Payer: Self-pay | Admitting: Family Medicine

## 2019-03-06 ENCOUNTER — Ambulatory Visit (INDEPENDENT_AMBULATORY_CARE_PROVIDER_SITE_OTHER): Payer: Medicare Other | Admitting: Family Medicine

## 2019-03-06 VITALS — BP 122/85 | HR 71

## 2019-03-06 DIAGNOSIS — I10 Essential (primary) hypertension: Secondary | ICD-10-CM

## 2019-03-06 NOTE — Assessment & Plan Note (Signed)
Well controlled Recheck CMP and FLP at next in person visit Continue current medications F/u in 6 month

## 2019-03-06 NOTE — Progress Notes (Signed)
Patient: Karen Henson Female    DOB: 10/02/1940   78 y.o.   MRN: [id number] Visit Date: 03/06/2019  Today's Provider: Lavon Paganini, MD   Chief Complaint  Patient presents with  . Hypertension   Subjective:    Virtual Visit via Telephone Note  I connected with Shan Levans on 03/06/19 at  2:00 PM EDT by a audio-only enabled telemedicine application and verified that I am speaking with the correct person using two identifiers.  Patient location: home Provider location: [place] involved in the visit: patient, provider    I discussed the limitations of evaluation and management by telemedicine and the availability of in person appointments. The patient expressed understanding and agreed to proceed.  HPI  Hypertension, follow-up:  BP Readings from Last 3 Encounters:  11/15/18 128/86  11/10/18 (!) 110/92  08/06/18 116/72   She was last seen for hypertension 7 months ago.  BP at that visit was 116/72. Management changes since that visit include continue medications. She reports good compliance with treatment. She is not having side effects.  She is exercising. She is adherent to low salt diet.   Outside blood pressures are being checked at home. She is experiencing none.  Patient denies chest pain, chest pressure/discomfort, claudication, dyspnea, exertional chest pressure/discomfort, fatigue, irregular heart beat, lower extremity edema, near-syncope, orthopnea, palpitations, paroxysmal nocturnal dyspnea, syncope and tachypnea.   Cardiovascular risk factors include advanced age (older than 10 for men, 69 for women) and hypertension.  Use of agents associated with hypertension: none.               Weight trend: stable    Wt Readings from Last 3 Encounters:  08/06/18 127 lb 9.6 oz (57.9 kg)  08/06/18 127 lb 9.6 oz (57.9 kg)  06/09/18 128 lb 6.4 oz (58.2 kg)   Current diet: well balanced  Allergies  Allergen Reactions  .  Lisinopril Cough     Current Outpatient Medications:  .  acetaminophen (TYLENOL) 500 MG tablet, Take 500 mg by mouth every 6 (six) hours as needed for moderate pain or headache., Disp: , Rfl:  .  budesonide-formoterol (SYMBICORT) 80-4.5 MCG/ACT inhaler, Inhale 2 puffs into the lungs 2 (two) times daily. (Patient taking differently: Inhale 2 puffs into the lungs 2 (two) times daily as needed (for shortness of breath or wheezing). ), Disp: 1 Inhaler, Rfl: 3 .  dextromethorphan-guaiFENesin (MUCINEX DM) 30-600 MG 12hr tablet, Take 1 tablet by mouth daily., Disp: , Rfl:  .  loratadine (CLARITIN) 10 MG tablet, Take 10 mg by mouth daily. , Disp: , Rfl:  .  losartan-hydrochlorothiazide (HYZAAR) 50-12.5 MG tablet, Take 1 tablet by mouth daily., Disp: 90 tablet, Rfl: 3 .  montelukast (SINGULAIR) 10 MG tablet, TAKE 1 TABLET BY MOUTH ONCE DAILY AT NIGHT FOR ASTHMA., Disp: 30 tablet, Rfl: 11  Review of Systems  Constitutional: Negative.   Respiratory: Negative.   Cardiovascular: Negative.   Musculoskeletal: Negative.     Social History   Tobacco Use  . Smoking status: Former Smoker    Packs/day: 0.25    Types: Cigarettes  . Smokeless tobacco: Never Used  . Tobacco comment: smoked a pack a week, quit about 30 years ago  Substance Use Topics  . Alcohol use: Yes    Comment: ocassional- monthly     Objective:   There were no vitals taken for this visit. There were no vitals filed for this visit.   Physical Exam  Assessment & Plan    I discussed the assessment and treatment plan with the patient. The patient was provided an opportunity to ask questions and all were answered. The patient agreed with the plan and demonstrated an understanding of the instructions.   The patient was advised to call back or seek an in-person evaluation if the symptoms worsen or if the condition fails to improve as anticipated.  Problem List Items Addressed This Visit      Cardiovascular and Mediastinum    BP (high blood pressure) - Primary    Well controlled Recheck CMP and FLP at next in person visit Continue current medications F/u in 6 month          Return in about 5 months (around 08/06/2019) for AWV/chronic disease f/u.   The entirety of the information documented in the History of Present Illness, Review of Systems and Physical Exam were personally obtained by me. Portions of this information were initially documented by Tiburcio Pea, CMA and reviewed by me for thoroughness and accuracy.    Mada Sadik, [name], MD MPH [facility]

## 2019-05-07 ENCOUNTER — Encounter: Payer: Self-pay | Admitting: General Surgery

## 2019-05-18 ENCOUNTER — Encounter: Payer: Self-pay | Admitting: Internal Medicine

## 2019-05-18 ENCOUNTER — Other Ambulatory Visit: Payer: Self-pay

## 2019-05-18 ENCOUNTER — Ambulatory Visit (INDEPENDENT_AMBULATORY_CARE_PROVIDER_SITE_OTHER): Payer: Medicare Other | Admitting: Internal Medicine

## 2019-05-18 VITALS — BP 128/88 | HR 67 | Resp 16 | Ht 62.0 in | Wt 128.0 lb

## 2019-05-18 DIAGNOSIS — I10 Essential (primary) hypertension: Secondary | ICD-10-CM | POA: Diagnosis not present

## 2019-05-18 DIAGNOSIS — J84112 Idiopathic pulmonary fibrosis: Secondary | ICD-10-CM

## 2019-05-18 DIAGNOSIS — J449 Chronic obstructive pulmonary disease, unspecified: Secondary | ICD-10-CM

## 2019-05-18 DIAGNOSIS — R0602 Shortness of breath: Secondary | ICD-10-CM

## 2019-05-18 NOTE — Progress Notes (Signed)
[facility] [place], [place] [address]  Pulmonary Sleep Medicine   Office Visit Note  Patient Name: Karen Henson DOB: 04-25-1941 MRN [id number]  Date of Service: 05/18/2019  Complaints/HPI: Pt is here for follow up on COPD and IPF.  She is doing very well.  Denies any sob or issues breathing at this time. Denies Chest pain, Shortness of breath, palpitations, headache, or sinus issues. She is using Symbicort as needed and reports not needing it as of late.   ROS  General: (-) fever, (-) chills, (-) night sweats, (-) weakness Skin: (-) rashes, (-) itching,. Eyes: (-) visual changes, (-) redness, (-) itching. Nose and Sinuses: (-) nasal stuffiness or itchiness, (-) postnasal drip, (-) nosebleeds, (-) sinus trouble. Mouth and Throat: (-) sore throat, (-) hoarseness. Neck: (-) swollen glands, (-) enlarged thyroid, (-) neck pain. Respiratory: - cough, (-) bloody sputum, - shortness of breath, - wheezing. Cardiovascular: - ankle swelling, (-) chest pain. Lymphatic: (-) lymph node enlargement. Neurologic: (-) numbness, (-) tingling. Psychiatric: (-) anxiety, (-) depression   Current Medication: Outpatient Encounter Medications as of 05/18/2019  Medication Sig  . acetaminophen (TYLENOL) 500 MG tablet Take 500 mg by mouth every 6 (six) hours as needed for moderate pain or headache.  . budesonide-formoterol (SYMBICORT) 80-4.5 MCG/ACT inhaler Inhale 2 puffs into the lungs 2 (two) times daily. (Patient taking differently: Inhale 2 puffs into the lungs 2 (two) times daily as needed (for shortness of breath or wheezing). )  . dextromethorphan-guaiFENesin (MUCINEX DM) 30-600 MG 12hr tablet Take 1 tablet by mouth daily.  Marland Kitchen loratadine (CLARITIN) 10 MG tablet Take 10 mg by mouth daily.   Marland Kitchen losartan-hydrochlorothiazide (HYZAAR) 50-12.5 MG tablet Take 1 tablet by mouth daily.  . montelukast (SINGULAIR) 10 MG tablet TAKE 1 TABLET BY MOUTH ONCE DAILY AT NIGHT FOR ASTHMA.    No facility-administered encounter medications on file as of 05/18/2019.     Surgical History: Past Surgical History:  Procedure Laterality Date  . BREAST EXCISIONAL BIOPSY Left 1996   neg  . CATARACT EXTRACTION W/PHACO Left 10/31/2017   Procedure: CATARACT EXTRACTION PHACO AND INTRAOCULAR LENS PLACEMENT (IOC);  Surgeon: Eulogio Bear, MD;  Location: [facility] ORS;  Service: Ophthalmology;  Laterality: Left;  [place] 00:43.7 AP% 19.6 CDE 8.57 FLUID PACK LOT # [id number]  . CATARACT EXTRACTION W/PHACO Right 12/05/2017   Procedure: CATARACT EXTRACTION PHACO AND INTRAOCULAR LENS PLACEMENT (IOC);  Surgeon: Eulogio Bear, MD;  Location: [facility] ORS;  Service: Ophthalmology;  Laterality: Right;  fluid pack lot #[id number]  exp 07/01/2019 [place]    00:31.4 AP%    8.8 CDE    2.79  . COLONOSCOPY  02/15/2006  . COLONOSCOPY WITH PROPOFOL N/A 03/21/2016   Procedure: COLONOSCOPY WITH PROPOFOL;  Surgeon: Robert Bellow, MD;  Location: [facility] ENDOSCOPY;  Service: Endoscopy;  Laterality: N/A;  . DILATION AND CURETTAGE OF UTERUS    . VARICOSE VEIN SURGERY      Medical History: Past Medical History:  Diagnosis Date  . Allergy    Environmental Allergies  . Arthritis   . Bronchitis, chronic ([place])   . Cough    CHRONIC  . Dyspnea    OCCAS  . Hypertension   . 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[place])   . Osteoporosis     Family History: Family History  Problem Relation Age of Onset  . Hypertension Mother   . Emphysema Father   . Stroke Sister   . Stroke Brother   . Diabetes Maternal Aunt   .  Diabetes Maternal Grandmother   . Lung cancer Grandchild   . Breast cancer Neg Hx   . Ovarian cancer Neg Hx   . Colon cancer Neg Hx     Social History: Social History   Socioeconomic History  . Marital status: Widowed    Spouse name: Not on file  . Number of children: 2  . Years of education: Not on file  . Highest education level: High school graduate  Occupational History  . Occupation: retired  Scientific laboratory technician  .  Financial resource strain: Not hard at all  . Food insecurity    Worry: Never true    Inability: Never true  . Transportation needs    Medical: No    Non-medical: No  Tobacco Use  . Smoking status: Former Smoker    Packs/day: 0.25    Types: Cigarettes  . Smokeless tobacco: Never Used  . Tobacco comment: smoked a pack a week, quit about 30 years ago  Substance and Sexual Activity  . Alcohol use: Yes    Comment: ocassional- monthly  . Drug use: No  . Sexual activity: Never  Lifestyle  . Physical activity    Days per week: 2 days    Minutes per session: 30 min  . Stress: Not at all  Relationships  . Social Herbalist on phone: Patient refused    Gets together: Patient refused    Attends religious service: Patient refused    Active member of club or organization: Patient refused    Attends meetings of clubs or organizations: Patient refused    Relationship status: Patient refused  . Intimate partner violence    Fear of current or ex partner: Patient refused    Emotionally abused: Patient refused    Physically abused: Patient refused    Forced sexual activity: Patient refused  Other Topics Concern  . Not on file  Social History Narrative  . Not on file    Vital Signs: Blood pressure 128/88, pulse 67, resp. rate 16, height 5\' 2"  (1.575 m), weight 128 lb (58.1 kg), SpO2 97 %.  Examination: General Appearance: The patient is well-developed, well-nourished, and in no distress. Skin: Gross inspection of skin unremarkable. Head: normocephalic, no gross deformities. Eyes: no gross deformities noted. ENT: ears appear grossly normal no exudates. Neck: Supple. No thyromegaly. No LAD. Respiratory: clear bilateraly. Cardiovascular: Normal S1 and S2 without murmur or rub. Extremities: No cyanosis. pulses are equal. Neurologic: Alert and oriented. No involuntary movements.  LABS: No results found for this or any previous visit (from the past 2160  hour(s)).  Radiology: Mm 3d Screen Breast Bilateral  Result Date: 07/21/2018 CLINICAL DATA:  Screening. EXAM: DIGITAL SCREENING BILATERAL MAMMOGRAM WITH TOMO AND CAD COMPARISON:  Previous exam(s). ACR Breast Density Category c: The breast tissue is heterogeneously dense, which may obscure small masses. FINDINGS: There are no findings suspicious for malignancy. Images were processed with CAD. IMPRESSION: No mammographic evidence of malignancy. A result letter of this screening mammogram will be mailed directly to the patient. RECOMMENDATION: Screening mammogram in one year. (Code:[id number]-01Y) BI-RADS CATEGORY  1: Negative. Electronically Signed   By: Fidela Salisbury M.D.   On: 07/21/2018 10:51    No results found.  No results found.    Assessment and Plan: Patient Active Problem List   Diagnosis Date Noted  . Chronic cough 08/05/2017  . 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 History of adenomatous polyp of colon 05/30/2016  . Allergic rhinitis  11/09/2015  . 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 BP (high blood pressure) 11/09/2015  . Fibrosis lung ([place]) 11/09/2015  . 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 OP (osteoporosis) 11/09/2015    1. Chronic obstructive pulmonary disease, unspecified COPD type ([place]) Stable, continue to use inhalers as prescribed.  -PFT  2. IPF (idiopathic pulmonary fibrosis) ([place]) Doing well at this time. Follow up in 6 months.   3. Essential hypertension Stable, continue current therapy.   4. SOB (shortness of breath) FVC is 2.0 which is 80% of predicted value FEV1 is 1.2 which is 63% of the predicted value FEV1/FVC is 59% with 79% of the predicted value today spirometry. - Spirometry with Graph  General Counseling: I have discussed the findings of the evaluation and examination with Teosha.  I have also discussed any further diagnostic evaluation thatmay be needed or ordered today. Veleta verbalizes understanding of the  findings of todays visit. We also reviewed her medications today and discussed drug interactions and side effects including but not limited excessive drowsiness and altered mental states. We also discussed that there is always a risk not just to her but also people around her. she has been encouraged to call the office with any questions or concerns that should arise related to todays visit.    Time spent: 15 This patient was seen by Orson Gear AGNP-C in Collaboration with Dr. Devona Konig as a part of collaborative care agreement.   I have personally obtained a history, examined the patient, evaluated laboratory and imaging results, formulated the assessment and plan and placed orders.    Allyne Gee, MD [facility] Pulmonary and Critical Care Sleep medicine

## 2019-05-27 ENCOUNTER — Ambulatory Visit (INDEPENDENT_AMBULATORY_CARE_PROVIDER_SITE_OTHER): Payer: Medicare Other | Admitting: Internal Medicine

## 2019-05-27 ENCOUNTER — Other Ambulatory Visit: Payer: Self-pay

## 2019-05-27 DIAGNOSIS — R0602 Shortness of breath: Secondary | ICD-10-CM

## 2019-05-27 DIAGNOSIS — J84112 Idiopathic pulmonary fibrosis: Secondary | ICD-10-CM

## 2019-05-27 LAB — PULMONARY FUNCTION TEST

## 2019-05-28 NOTE — Procedures (Signed)
[facility] [place], [id number]  DATE OF SERVICE: May 27, 2019  Complete Pulmonary Function Testing Interpretation:  FINDINGS:  The forced vital capacity is mildly decreased.  The FEV1 is 1.09 L which is 58% of predicted and is moderately decreased.  FEV1 FVC ratio is moderately decreased.  Postbronchodilator there is no significant improvement in the FEV1 however clinical improvement may still occur in the absence of spirometric improvement and does not preclude the use of bronchodilators.  Total lung capacity is increased residual volume is increased residual volume total lung capacity ratio is increased thoracic gas volume is increased.  DLCO is normal limits  IMPRESSION:  This pulmonary function study is consistent with moderate obstructive lung disease with hyperinflation.  The DLCO is at the lower limits of normal.  No significant improvement postbronchodilator however clinical improvement may still occur in the absence of spirometric improvement and does not preclude the use of bronchodilators.  Allyne Gee, MD [facility] Pulmonary Critical Care Medicine Sleep Medicine

## 2019-05-29 DIAGNOSIS — Z23 Encounter for immunization: Secondary | ICD-10-CM | POA: Diagnosis not present

## 2019-06-17 ENCOUNTER — Other Ambulatory Visit: Payer: Self-pay

## 2019-06-17 ENCOUNTER — Encounter: Payer: Self-pay | Admitting: Internal Medicine

## 2019-06-17 ENCOUNTER — Ambulatory Visit (INDEPENDENT_AMBULATORY_CARE_PROVIDER_SITE_OTHER): Payer: Medicare Other | Admitting: Internal Medicine

## 2019-06-17 VITALS — BP 149/91 | HR 70 | Temp 98.8°F | Resp 16

## 2019-06-17 DIAGNOSIS — J0100 Acute maxillary sinusitis, unspecified: Secondary | ICD-10-CM | POA: Diagnosis not present

## 2019-06-17 DIAGNOSIS — T7840XD Allergy, unspecified, subsequent encounter: Secondary | ICD-10-CM

## 2019-06-17 MED ORDER — AZITHROMYCIN 250 MG PO TABS: ORAL_TABLET | ORAL | 0 refills | Status: DC

## 2019-06-17 NOTE — Progress Notes (Signed)
[facility] [place], [place] [address]  Internal MEDICINE  Telephone Visit  Patient Name: Karen Henson  [id number]  [id number]  Date of Service: 06/17/2019  I connected with the patient at 1054 by telephone and verified the patients identity using two identifiers.   I discussed the limitations, risks, security and privacy concerns of performing an evaluation and management service by telephone and the availability of in person appointments. I also discussed with the patient that there may be a patient responsible charge related to the service.  The patient expressed understanding and agrees to proceed.    Chief Complaint  Patient presents with  . Telephone Screen  . Sinusitis    started monday evening , no sob, no fever   . Headache  . Sore Throat  . Telephone Assessment    HPI  Pt seen via telephone for scratchy throat, coughing, runny nose and headache.  She isn't sure if she has had a fever, but has had chills once or twice. She denies any other symptoms.She is concerned that it will go down into her chest.  She is using Flonase, and montelukast as directed.      Current Medication: Outpatient Encounter Medications as of 06/17/2019  Medication Sig  . acetaminophen (TYLENOL) 500 MG tablet Take 500 mg by mouth every 6 (six) hours as needed for moderate pain or headache.  . budesonide-formoterol (SYMBICORT) 80-4.5 MCG/ACT inhaler Inhale 2 puffs into the lungs 2 (two) times daily. (Patient taking differently: Inhale 2 puffs into the lungs 2 (two) times daily as needed (for shortness of breath or wheezing). )  . dextromethorphan-guaiFENesin (MUCINEX DM) 30-600 MG 12hr tablet Take 1 tablet by mouth daily.  Marland Kitchen loratadine (CLARITIN) 10 MG tablet Take 10 mg by mouth daily.   Marland Kitchen losartan-hydrochlorothiazide (HYZAAR) 50-12.5 MG tablet Take 1 tablet by mouth daily.  . montelukast (SINGULAIR) 10 MG tablet TAKE 1 TABLET BY MOUTH ONCE DAILY AT NIGHT FOR ASTHMA.  Marland Kitchen  azithromycin (ZITHROMAX) 250 MG tablet Take as directed   No facility-administered encounter medications on file as of 06/17/2019.     Surgical History: Past Surgical History:  Procedure Laterality Date  . BREAST EXCISIONAL BIOPSY Left 1996   neg  . CATARACT EXTRACTION W/PHACO Left 10/31/2017   Procedure: CATARACT EXTRACTION PHACO AND INTRAOCULAR LENS PLACEMENT (IOC);  Surgeon: Eulogio Bear, MD;  Location: [facility] ORS;  Service: Ophthalmology;  Laterality: Left;  [place] 00:43.7 AP% 19.6 CDE 8.57 FLUID PACK LOT # [id number]  . CATARACT EXTRACTION W/PHACO Right 12/05/2017   Procedure: CATARACT EXTRACTION PHACO AND INTRAOCULAR LENS PLACEMENT (IOC);  Surgeon: Eulogio Bear, MD;  Location: [facility] ORS;  Service: Ophthalmology;  Laterality: Right;  fluid pack lot [id number] H  exp 07/01/2019 [place]    00:31.4 AP%    8.8 CDE    2.79  . COLONOSCOPY  02/15/2006  . COLONOSCOPY WITH PROPOFOL N/A 03/21/2016   Procedure: COLONOSCOPY WITH PROPOFOL;  Surgeon: Robert Bellow, MD;  Location: [facility] ENDOSCOPY;  Service: Endoscopy;  Laterality: N/A;  . DILATION AND CURETTAGE OF UTERUS    . VARICOSE VEIN SURGERY      Medical History: Past Medical History:  Diagnosis Date  . Allergy    Environmental Allergies  . Arthritis   . Bronchitis, chronic ([place])   . Cough    CHRONIC  . Dyspnea    OCCAS  . Hypertension   . 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[place])   . Osteoporosis     Family History: Family History  Problem Relation Age of Onset  . Hypertension Mother   . Emphysema Father   . Stroke Sister   . Stroke Brother   . Diabetes Maternal Aunt   . Diabetes Maternal Grandmother   . Lung cancer Grandchild   . Breast cancer Neg Hx   . Ovarian cancer Neg Hx   . Colon cancer Neg Hx     Social History   Socioeconomic History  . Marital status: Widowed    Spouse name: Not on file  . Number of children: 2  . Years of education: Not on file  . Highest education level: High school graduate  Occupational History  .  Occupation: retired  Scientific laboratory technician  . Financial resource strain: Not hard at all  . Food insecurity    Worry: Never true    Inability: Never true  . Transportation needs    Medical: No    Non-medical: No  Tobacco Use  . Smoking status: Former Smoker    Packs/day: 0.25    Types: Cigarettes  . Smokeless tobacco: Never Used  . Tobacco comment: smoked a pack a week, quit about 30 years ago  Substance and Sexual Activity  . Alcohol use: Yes    Comment: ocassional- monthly  . Drug use: No  . Sexual activity: Never  Lifestyle  . Physical activity    Days per week: 2 days    Minutes per session: 30 min  . Stress: Not at all  Relationships  . Social Herbalist on phone: Patient refused    Gets together: Patient refused    Attends religious service: Patient refused    Active member of club or organization: Patient refused    Attends meetings of clubs or organizations: Patient refused    Relationship status: Patient refused  . Intimate partner violence    Fear of current or ex partner: Patient refused    Emotionally abused: Patient refused    Physically abused: Patient refused    Forced sexual activity: Patient refused  Other Topics Concern  . Not on file  Social History Narrative  . Not on file      Review of Systems  Constitutional: Negative for chills, fatigue and unexpected weight change.  HENT: Positive for postnasal drip and sinus pressure. Negative for congestion, rhinorrhea, sneezing and sore throat.   Eyes: Negative for photophobia, pain and redness.  Respiratory: Positive for cough. Negative for chest tightness and shortness of breath.   Cardiovascular: Negative for chest pain and palpitations.  Gastrointestinal: Negative for abdominal pain, constipation, diarrhea, nausea and vomiting.  Endocrine: Negative.   Genitourinary: Negative for dysuria and frequency.  Musculoskeletal: Negative for arthralgias, back pain, joint swelling and neck pain.  Skin:  Negative for rash.  Allergic/Immunologic: Negative.   Neurological: Negative for tremors and numbness.  Hematological: Negative for adenopathy. Does not bruise/bleed easily.  Psychiatric/Behavioral: Negative for behavioral problems and sleep disturbance. The patient is not nervous/anxious.     Vital Signs: BP (!) 149/91   Pulse 70   Temp 98.8 F (37.1 C)   Resp 16    Observation/Objective:  Well sounding, NAD noted    Assessment/Plan: 1. Allergic state, subsequent encounter Continue Flonase, and OTC medications as tolerated.    2. Subacute maxillary sinusitis Advised patient to take entire course of antibiotics as prescribed with food. Pt should return to clinic in 7-10 days if symptoms fail to improve or new symptoms develop.  - azithromycin (ZITHROMAX) 250 MG tablet; Take as directed  Dispense: 6 tablet; Refill: 0  General Counseling: Lasheka verbalizes understanding of the findings of today's phone visit and agrees with plan of treatment. I have discussed any further diagnostic evaluation that may be needed or ordered today. We also reviewed her medications today. she has been encouraged to call the office with any questions or concerns that should arise related to todays visit.    No orders of the defined types were placed in this encounter.   Meds ordered this encounter  Medications  . azithromycin (ZITHROMAX) 250 MG tablet    Sig: Take as directed    Dispense:  6 tablet    Refill:  0    Time spent: [place] AGNP-C Internal medicine

## 2019-06-18 ENCOUNTER — Telehealth: Payer: Self-pay

## 2019-06-18 NOTE — Telephone Encounter (Signed)
-----   Message from Virginia Crews, MD sent at 06/18/2019  8:36 AM EDT ----- Did patient establish at new PCP office? Should call us for acute issues if she is still a patient here ----- Message ----- From: Kendell Bane, NP Sent: 06/17/2019   7:43 PM EDT To: Virginia Crews, MD

## 2019-06-18 NOTE — Telephone Encounter (Signed)
Patient states she goes to [facility] only. She did not change PCP's.

## 2019-06-18 NOTE — Telephone Encounter (Signed)
Noted  

## 2019-07-06 ENCOUNTER — Other Ambulatory Visit: Payer: Self-pay | Admitting: Family Medicine

## 2019-07-06 DIAGNOSIS — Z1231 Encounter for screening mammogram for malignant neoplasm of breast: Secondary | ICD-10-CM

## 2019-08-06 NOTE — Progress Notes (Signed)
Subjective:   Karen Henson is a 78 y.o. female who presents for Medicare Annual (Subsequent) preventive examination.    This visit is being conducted through telemedicine due to the COVID-19 pandemic. This patient has given me verbal consent via doximity to conduct this visit, patient states they are participating from their home address. Some vital signs may be absent or patient reported.    Patient identification: identified by name, DOB, and current address  Review of Systems:  N/A  Cardiac Risk Factors include: advanced age (>49men, >21 women);hypertension     Objective:     Vitals: There were no vitals taken for this visit.  There is no height or weight on file to calculate BMI. Unable to obtain vitals due to visit being conducted via telephonically.   Advanced Directives 08/10/2019 08/06/2018 10/31/2017 07/23/2017 01/30/2016  Does Patient Have a Medical Advance Directive? Yes Yes Yes Yes Yes  Type of Paramedic of [place];Living will Living will;Healthcare Power of [place];Living will [place];Living will [place];Living will  Copy of [place] in Chart? No - copy requested No - copy requested No - copy requested No - copy requested -    Tobacco Social History   Tobacco Use  Smoking Status Former Smoker  . Packs/day: 0.25  . Types: Cigarettes  Smokeless Tobacco Never Used  Tobacco Comment   smoked a pack a week, quit about 30 years ago     Counseling given: Not Answered Comment: smoked a pack a week, quit about 30 years ago   Clinical Intake:  Pre-visit preparation completed: Yes  Pain : No/denies pain Pain Score: 0-No pain     Nutritional Risks: None Diabetes: No  How often do you need to have someone help you when you read instructions, pamphlets, or other written materials from your doctor or pharmacy?: 1 - Never  Interpreter Needed?: No  Information entered by :: [facility], LPN  Past Medical History:  Diagnosis Date  . Allergy    Environmental Allergies  . Arthritis   . Bronchitis, chronic ([place])   . Cough    CHRONIC  . Dyspnea    OCCAS  . Hypertension   . 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[place])   . Osteoporosis    Past Surgical History:  Procedure Laterality Date  . BREAST EXCISIONAL BIOPSY Left 1996   neg  . CATARACT EXTRACTION W/PHACO Left 10/31/2017   Procedure: CATARACT EXTRACTION PHACO AND INTRAOCULAR LENS PLACEMENT (IOC);  Surgeon: Eulogio Bear, MD;  Location: [facility] ORS;  Service: Ophthalmology;  Laterality: Left;  [place] 00:43.7 AP% 19.6 CDE 8.57 FLUID PACK LOT # [id number]  . CATARACT EXTRACTION W/PHACO Right 12/05/2017   Procedure: CATARACT EXTRACTION PHACO AND INTRAOCULAR LENS PLACEMENT (IOC);  Surgeon: Eulogio Bear, MD;  Location: [facility] ORS;  Service: Ophthalmology;  Laterality: Right;  fluid pack lot [id number] H  exp 07/01/2019 [place]    00:31.4 AP%    8.8 CDE    2.79  . COLONOSCOPY  02/15/2006  . COLONOSCOPY WITH PROPOFOL N/A 03/21/2016   Procedure: COLONOSCOPY WITH PROPOFOL;  Surgeon: Robert Bellow, MD;  Location: [facility] ENDOSCOPY;  Service: Endoscopy;  Laterality: N/A;  . DILATION AND CURETTAGE OF UTERUS    . VARICOSE VEIN SURGERY     Family History  Problem Relation Age of Onset  . Hypertension Mother   . Emphysema Father   . Stroke Sister   . Stroke Brother   . Diabetes Maternal  Aunt   . Diabetes Maternal Grandmother   . Lung cancer Grandchild   . Breast cancer Neg Hx   . Ovarian cancer Neg Hx   . Colon cancer Neg Hx    Social History   Socioeconomic History  . Marital status: Widowed    Spouse name: Not on file  . Number of children: 2  . Years of education: Not on file  . Highest education level: High school graduate  Occupational History  . Occupation: retired  Scientific laboratory technician  . Financial resource strain: Not hard at all  . Food insecurity    Worry: Never true    Inability: Never true  .  Transportation needs    Medical: No    Non-medical: No  Tobacco Use  . Smoking status: Former Smoker    Packs/day: 0.25    Types: Cigarettes  . Smokeless tobacco: Never Used  . Tobacco comment: smoked a pack a week, quit about 30 years ago  Substance and Sexual Activity  . Alcohol use: Yes    Comment: ocassional- monthly  . Drug use: No  . Sexual activity: Never  Lifestyle  . Physical activity    Days per week: 0 days    Minutes per session: 0 min  . Stress: Not at all  Relationships  . Social Herbalist on phone: Patient refused    Gets together: Patient refused    Attends religious service: Patient refused    Active member of club or organization: Patient refused    Attends meetings of clubs or organizations: Patient refused    Relationship status: Patient refused  Other Topics Concern  . Not on file  Social History Narrative  . Not on file    Outpatient Encounter Medications as of 08/10/2019  Medication Sig  . acetaminophen (TYLENOL) 500 MG tablet Take 500 mg by mouth every 6 (six) hours as needed for moderate pain or headache.  . budesonide-formoterol (SYMBICORT) 80-4.5 MCG/ACT inhaler Inhale 2 puffs into the lungs 2 (two) times daily. (Patient taking differently: Inhale 2 puffs into the lungs 2 (two) times daily as needed (for shortness of breath or wheezing). )  . dextromethorphan-guaiFENesin (MUCINEX DM) 30-600 MG 12hr tablet Take 1 tablet by mouth daily.  Marland Kitchen loratadine (CLARITIN) 10 MG tablet Take 10 mg by mouth daily.   Marland Kitchen losartan-hydrochlorothiazide (HYZAAR) 50-12.5 MG tablet Take 1 tablet by mouth daily.  . montelukast (SINGULAIR) 10 MG tablet TAKE 1 TABLET BY MOUTH ONCE DAILY AT NIGHT FOR ASTHMA.  Marland Kitchen azithromycin (ZITHROMAX) 250 MG tablet Take as directed (Patient not taking: Reported on 08/10/2019)   No facility-administered encounter medications on file as of 08/10/2019.     Activities of Daily Living In your present state of health, do you have any  difficulty performing the following activities: 08/10/2019  Hearing? N  Vision? N  Difficulty concentrating or making decisions? N  Walking or climbing stairs? N  Dressing or bathing? N  Doing errands, shopping? N  Preparing Food and eating ? N  Using the Toilet? N  In the past six months, have you accidently leaked urine? N  Do you have problems with loss of bowel control? N  Managing your Medications? N  Managing your Finances? N  Housekeeping or managing your Housekeeping? N  Some recent data might be hidden    Patient Care Team: Virginia Crews, MD as PCP - General (Family Medicine) Bary Castilla, Forest Gleason, MD (General Surgery) Allyne Gee, MD as Consulting Physician (  Internal Medicine) [place], [place] (Optometry)    Assessment:   This is a routine wellness examination for [place].  Exercise Activities and Dietary recommendations Current Exercise Habits: Home exercise routine, Type of exercise: walking, Time (Minutes): 30, Frequency (Times/Week): 4, Weekly Exercise (Minutes/Week): 120, Intensity: Mild, Exercise limited by: None identified  Goals    . DIET - INCREASE WATER INTAKE     Recommend to drink at least 6-8 8oz glasses of water per day.    Marland Kitchen DIET - REDUCE SUGAR INTAKE     Recommend to monitor sugar intake and avoid sweets in daily diet.        Fall Risk: Fall Risk  08/10/2019 03/06/2019 08/06/2018 05/01/2018 01/27/2018  Falls in the past year? 0 0 0 No No  Number falls in past yr: 0 - - - -  Injury with Fall? 0 - - - -    FALL RISK PREVENTION PERTAINING TO THE HOME:  Any stairs in or around the home? No  If so, are there any without handrails? N/A  Home free of loose throw rugs in walkways, pet beds, electrical cords, etc? Yes  Adequate lighting in your home to reduce risk of falls? Yes   ASSISTIVE DEVICES UTILIZED TO PREVENT FALLS:  Life alert? No  Use of a cane, walker or w/c? No  Grab bars in the bathroom? Yes  Shower chair or bench in shower?  No  Elevated toilet seat or a handicapped toilet? Yes    TIMED UP AND GO:  Was the test performed? No .    Depression Screen PHQ 2/9 Scores 08/10/2019 08/06/2018 05/01/2018 01/27/2018  PHQ - 2 Score 0 0 0 0     Cognitive Function     6CIT Screen 08/06/2018  What Year? 0 points  What month? 0 points  What time? 0 points  Count back from 20 0 points  Months in reverse 0 points  Repeat phrase 0 points  Total Score 0    Immunization History  Administered Date(s) Administered  . Influenza Inj Mdck Quad Pf 05/29/2019  . Influenza, High Dose Seasonal PF 07/23/2017, 07/21/2018  . Influenza-Unspecified 08/01/2015  . Pneumococcal Conjugate-13 08/01/2015  . Pneumococcal Polysaccharide-23 07/26/2014  . Td 02/26/2008  . Zoster 03/17/2009  . Zoster Recombinat (Shingrix) 07/21/2018, 09/26/2018    Qualifies for Shingles Vaccine? Completed series  Tdap: Although this vaccine is not a covered service during a Wellness Exam, does the patient still wish to receive this vaccine today?  No .   Flu Vaccine: Up to date  Pneumococcal Vaccine: Completed series  Screening Tests Health Maintenance  Topic Date Due  . DEXA SCAN  02/19/2018  . TETANUS/TDAP  08/07/2023 (Originally 02/25/2018)  . COLONOSCOPY  03/22/2021  . INFLUENZA VACCINE  Completed  . PNA vac Low Risk Adult  Completed    Cancer Screenings:  Colorectal Screening: Completed 03/22/16. Repeat every 5 years.  Mammogram: Completed 07/21/18.   Bone Density: Completed 02/20/16. Results reflect OSTEOPOROSIS. Repeat every 2 years. Ordered today. Pt provided with contact info and advised to call to schedule appt. Pt aware the office will call re: appt.  Lung Cancer Screening: (Low Dose CT Chest recommended if Age 42-80 years, 30 pack-year currently smoking OR have quit w/in 15years.) does not qualify.   Additional Screening:  Vision Screening: Recommended annual ophthalmology exams for early detection of glaucoma and other  disorders of the eye.  Dental Screening: Recommended annual dental exams for proper oral hygiene  Community Resource Referral:  CRR required this visit?  No       Plan:  I have personally reviewed and addressed the Medicare Annual Wellness questionnaire and have noted the following in the patient's chart:  A. Medical and social history B. Use of alcohol, tobacco or illicit drugs  C. Current medications and supplements D. Functional ability and status E.  Nutritional status F.  Physical activity G. Advance directives H. List of other physicians I.  Hospitalizations, surgeries, and ER visits in previous 12 months J.  [place] such as hearing and vision if needed, cognitive and depression L. Referrals and appointments   In addition, I have reviewed and discussed with patient certain preventive protocols, quality metrics, and best practice recommendations. A written personalized care plan for preventive services as well as general preventive health recommendations were provided to patient. Nurse Health Advisor  Signed,    Jersee Winiarski [place], [place]  [id number] Nurse Health Advisor   Nurse Notes: None.

## 2019-08-10 ENCOUNTER — Other Ambulatory Visit: Payer: Self-pay

## 2019-08-10 ENCOUNTER — Ambulatory Visit (INDEPENDENT_AMBULATORY_CARE_PROVIDER_SITE_OTHER): Payer: Medicare Other

## 2019-08-10 DIAGNOSIS — Z0000 Encounter for general adult medical examination without abnormal findings: Secondary | ICD-10-CM

## 2019-08-10 DIAGNOSIS — E2839 Other primary ovarian failure: Secondary | ICD-10-CM | POA: Diagnosis not present

## 2019-08-10 NOTE — Patient Instructions (Signed)
Karen Henson , Thank you for taking time to come for your Medicare Wellness Visit. I appreciate your ongoing commitment to your health goals. Please review the following plan we discussed and let me know if I can assist you in the future.   Screening recommendations/referrals: Colonoscopy: Up to date, due 03/2021 Mammogram: No longer required.  Bone Density: Ordered today. Pt provided with contact info and advised to call to schedule appt. Pt aware the office will call re: appt. Recommended yearly ophthalmology/optometry visit for glaucoma screening and checkup Recommended yearly dental visit for hygiene and checkup  Vaccinations: Influenza vaccine: Up to date Pneumococcal vaccine: Completed series Tdap vaccine: Pt declines today.  Shingles vaccine: Completed series    Advanced directives: Please bring a copy of your POA (Power of Attorney) and/or Living Will to your next appointment.   Conditions/risks identified: Recommend to drink 6-8 8 oz glasses of water a day.   Next appointment: 08/11/19 @ 10:00 AM with Dr Brita Romp.    Preventive Care 32 Years and Older, Female Preventive care refers to lifestyle choices and visits with your health care provider that can promote health and wellness. What does preventive care include?  A yearly physical exam. This is also called an annual well check.  Dental exams once or twice a year.  Routine eye exams. Ask your health care provider how often you should have your eyes checked.  Personal lifestyle choices, including:  Daily care of your teeth and gums.  Regular physical activity.  Eating a healthy diet.  Avoiding tobacco and drug use.  Limiting alcohol use.  Practicing safe sex.  Taking low-dose aspirin every day.  Taking vitamin and mineral supplements as recommended by your health care provider. What happens during an annual well check? The services and screenings done by your health care provider during your annual well  check will depend on your age, overall health, lifestyle risk factors, and family history of disease. Counseling  Your health care provider may ask you questions about your:  Alcohol use.  Tobacco use.  Drug use.  Emotional well-being.  Home and relationship well-being.  Sexual activity.  Eating habits.  History of falls.  Memory and ability to understand (cognition).  Work and work Statistician.  Reproductive health. Screening  You may have the following tests or measurements:  Height, weight, and BMI.  Blood pressure.  Lipid and cholesterol levels. These may be checked every 5 years, or more frequently if you are over 44 years old.  Skin check.  Lung cancer screening. You may have this screening every year starting at age 24 if you have a 30-pack-year history of smoking and currently smoke or have quit within the past 15 years.  Fecal occult blood test (FOBT) of the stool. You may have this test every year starting at age 48.  Flexible sigmoidoscopy or colonoscopy. You may have a sigmoidoscopy every 5 years or a colonoscopy every 10 years starting at age 37.  Hepatitis C blood test.  Hepatitis B blood test.  Sexually transmitted disease (STD) testing.  Diabetes screening. This is done by checking your blood sugar (glucose) after you have not eaten for a while (fasting). You may have this done every 1-3 years.  Bone density scan. This is done to screen for osteoporosis. You may have this done starting at age 10.  Mammogram. This may be done every 1-2 years. Talk to your health care provider about how often you should have regular mammograms. Talk with your health care  provider about your test results, treatment options, and if necessary, the need for more tests. Vaccines  Your health care provider may recommend certain vaccines, such as:  Influenza vaccine. This is recommended every year.  Tetanus, diphtheria, and acellular pertussis (Tdap, Td) vaccine. You  may need a Td booster every 10 years.  Zoster vaccine. You may need this after age 32.  Pneumococcal 13-valent conjugate (PCV13) vaccine. One dose is recommended after age 55.  Pneumococcal polysaccharide (PPSV23) vaccine. One dose is recommended after age 96. Talk to your health care provider about which screenings and vaccines you need and how often you need them. This information is not intended to replace advice given to you by your health care provider. Make sure you discuss any questions you have with your health care provider. Document Released: 10/14/2015 Document Revised: 06/06/2016 Document Reviewed: 07/19/2015 Elsevier Interactive Patient Education  2017 [place] Prevention in the Home Falls can cause injuries. They can happen to people of all ages. There are many things you can do to make your home safe and to help prevent falls. What can I do on the outside of my home?  Regularly fix the edges of walkways and driveways and fix any cracks.  Remove anything that might make you trip as you walk through a door, such as a raised step or threshold.  Trim any bushes or trees on the path to your home.  Use bright outdoor lighting.  Clear any walking paths of anything that might make someone trip, such as rocks or tools.  Regularly check to see if handrails are loose or broken. Make sure that both sides of any steps have handrails.  Any raised decks and porches should have guardrails on the edges.  Have any leaves, snow, or ice cleared regularly.  Use sand or salt on walking paths during winter.  Clean up any spills in your garage right away. This includes oil or grease spills. What can I do in the bathroom?  Use night lights.  Install grab bars by the toilet and in the tub and shower. Do not use towel bars as grab bars.  Use non-skid mats or decals in the tub or shower.  If you need to sit down in the shower, use a plastic, non-slip stool.  Keep the floor  dry. Clean up any water that spills on the floor as soon as it happens.  Remove soap buildup in the tub or shower regularly.  Attach bath mats securely with double-sided non-slip rug tape.  Do not have throw rugs and other things on the floor that can make you trip. What can I do in the bedroom?  Use night lights.  Make sure that you have a light by your bed that is easy to reach.  Do not use any sheets or blankets that are too big for your bed. They should not hang down onto the floor.  Have a firm chair that has side arms. You can use this for support while you get dressed.  Do not have throw rugs and other things on the floor that can make you trip. What can I do in the kitchen?  Clean up any spills right away.  Avoid walking on wet floors.  Keep items that you use a lot in easy-to-reach places.  If you need to reach something above you, use a strong step stool that has a grab bar.  Keep electrical cords out of the way.  Do not use floor polish  or wax that makes floors slippery. If you must use wax, use non-skid floor wax.  Do not have throw rugs and other things on the floor that can make you trip. What can I do with my stairs?  Do not leave any items on the stairs.  Make sure that there are handrails on both sides of the stairs and use them. Fix handrails that are broken or loose. Make sure that handrails are as long as the stairways.  Check any carpeting to make sure that it is firmly attached to the stairs. Fix any carpet that is loose or worn.  Avoid having throw rugs at the top or bottom of the stairs. If you do have throw rugs, attach them to the floor with carpet tape.  Make sure that you have a light switch at the top of the stairs and the bottom of the stairs. If you do not have them, ask someone to add them for you. What else can I do to help prevent falls?  Wear shoes that:  Do not have high heels.  Have rubber bottoms.  Are comfortable and fit you  well.  Are closed at the toe. Do not wear sandals.  If you use a stepladder:  Make sure that it is fully opened. Do not climb a closed stepladder.  Make sure that both sides of the stepladder are locked into place.  Ask someone to hold it for you, if possible.  Clearly mark and make sure that you can see:  Any grab bars or handrails.  First and last steps.  Where the edge of each step is.  Use tools that help you move around (mobility aids) if they are needed. These include:  Canes.  Walkers.  Scooters.  Crutches.  Turn on the lights when you go into a dark area. Replace any light bulbs as soon as they burn out.  Set up your furniture so you have a clear path. Avoid moving your furniture around.  If any of your floors are uneven, fix them.  If there are any pets around you, be aware of where they are.  Review your medicines with your doctor. Some medicines can make you feel dizzy. This can increase your chance of falling. Ask your doctor what other things that you can do to help prevent falls. This information is not intended to replace advice given to you by your health care provider. Make sure you discuss any questions you have with your health care provider. Document Released: 07/14/2009 Document Revised: 02/23/2016 Document Reviewed: 10/22/2014 Elsevier Interactive Patient Education  2017 [employer].

## 2019-08-11 ENCOUNTER — Ambulatory Visit: Payer: Medicare Other

## 2019-08-11 ENCOUNTER — Ambulatory Visit (INDEPENDENT_AMBULATORY_CARE_PROVIDER_SITE_OTHER): Payer: Medicare Other | Admitting: Family Medicine

## 2019-08-11 ENCOUNTER — Encounter: Payer: Self-pay | Admitting: Family Medicine

## 2019-08-11 ENCOUNTER — Other Ambulatory Visit: Payer: Self-pay

## 2019-08-11 VITALS — BP 124/87 | HR 61 | Temp 96.2°F | Wt 127.0 lb

## 2019-08-11 DIAGNOSIS — Z79899 Other long term (current) drug therapy: Secondary | ICD-10-CM

## 2019-08-11 DIAGNOSIS — J8410 Pulmonary fibrosis, unspecified: Secondary | ICD-10-CM | POA: Diagnosis not present

## 2019-08-11 DIAGNOSIS — J301 Allergic rhinitis due to pollen: Secondary | ICD-10-CM | POA: Diagnosis not present

## 2019-08-11 DIAGNOSIS — M810 Age-related osteoporosis without current pathological fracture: Secondary | ICD-10-CM | POA: Diagnosis not present

## 2019-08-11 DIAGNOSIS — I10 Essential (primary) hypertension: Secondary | ICD-10-CM | POA: Diagnosis not present

## 2019-08-11 NOTE — Assessment & Plan Note (Signed)
Repeat DEXA ordered recently at [facility] and Vit D Encouraged weight bearing exercise

## 2019-08-11 NOTE — Assessment & Plan Note (Signed)
Well controlled Continue current medications Recheck metabolic panel F/u in 6 months  

## 2019-08-11 NOTE — Progress Notes (Signed)
Patient: Karen Henson Female    DOB: [birth year]   78 y.o.   MRN: [id number] Visit Date: 08/11/2019  Today's Provider: Lavon Paganini, MD   Chief Complaint  Patient presents with  . Hypertension   Subjective:     HPI    Hypertension, follow-up:  BP Readings from Last 3 Encounters:  08/11/19 124/87  06/17/19 (!) 149/91  05/18/19 128/88    She was last seen for hypertension 5 months ago.  BP at that visit was 122/85. Management since that visit includes No changes. She reports excellent compliance with treatment. She is not having side effects.  She is exercising. She is adherent to low salt diet.   Outside blood pressures are being occasionally.  Pt states most of the time her BP is normal with a few high readings. She is experiencing none.  Patient denies chest pain, dyspnea, exertional chest pressure/discomfort, lower extremity edema and near-syncope.   Cardiovascular risk factors include advanced age (older than 74 for men, 41 for women) and hypertension.  Use of agents associated with hypertension: none.     Weight trend: stable Wt Readings from Last 3 Encounters:  08/11/19 127 lb (57.6 kg)  05/18/19 128 lb (58.1 kg)  11/15/18 124 lb (56.2 kg)    Current diet: in general, a "healthy" diet    ------------------------------------------------------------------------    Allergies  Allergen Reactions  . Lisinopril Cough     Current Outpatient Medications:  .  acetaminophen (TYLENOL) 500 MG tablet, Take 500 mg by mouth every 6 (six) hours as needed for moderate pain or headache., Disp: , Rfl:  .  budesonide-formoterol (SYMBICORT) 80-4.5 MCG/ACT inhaler, Inhale 2 puffs into the lungs 2 (two) times daily. (Patient taking differently: Inhale 2 puffs into the lungs 2 (two) times daily as needed (for shortness of breath or wheezing). ), Disp: 1 Inhaler, Rfl: 3 .  dextromethorphan-guaiFENesin (MUCINEX DM) 30-600 MG 12hr tablet, Take 1 tablet by mouth  daily., Disp: , Rfl:  .  loratadine (CLARITIN) 10 MG tablet, Take 10 mg by mouth daily. , Disp: , Rfl:  .  losartan-hydrochlorothiazide (HYZAAR) 50-12.5 MG tablet, Take 1 tablet by mouth daily., Disp: 90 tablet, Rfl: 3 .  montelukast (SINGULAIR) 10 MG tablet, TAKE 1 TABLET BY MOUTH ONCE DAILY AT NIGHT FOR ASTHMA., Disp: 30 tablet, Rfl: 11 .  azithromycin (ZITHROMAX) 250 MG tablet, Take as directed (Patient not taking: Reported on 08/10/2019), Disp: 6 tablet, Rfl: 0  Review of Systems  Constitutional: Negative.   HENT: Negative.   Eyes: Negative.   Respiratory: Positive for shortness of breath. Negative for apnea, cough, choking, chest tightness, wheezing and stridor.   Cardiovascular: Negative.   Gastrointestinal: Negative.   Endocrine: Negative.   Genitourinary: Negative.   Musculoskeletal: Negative.   Skin: Negative.   Allergic/Immunologic: Negative.   Neurological: Negative.   Hematological: Negative.   Psychiatric/Behavioral: Negative.     Social History   Tobacco Use  . Smoking status: Former Smoker    Packs/day: 0.25    Types: Cigarettes  . Smokeless tobacco: Never Used  . Tobacco comment: smoked a pack a week, quit about 30 years ago  Substance Use Topics  . Alcohol use: Yes    Comment: ocassional- monthly      Objective:   BP 124/87 (BP Location: Left Arm, Patient Position: Sitting, Cuff Size: Normal)   Pulse 61   Temp (!) 96.2 F (35.7 C) (Temporal)   Wt 127 lb (57.6 kg)  BMI 23.23 kg/m  Vitals:   08/11/19 1002  BP: 124/87  Pulse: 61  Temp: (!) 96.2 F (35.7 C)  TempSrc: Temporal  Weight: 127 lb (57.6 kg)  Body mass index is 23.23 kg/m.   Physical Exam Vitals signs reviewed.  Constitutional:      General: She is not in acute distress.    Appearance: Normal appearance. She is well-developed. She is not diaphoretic.  HENT:     Head: Normocephalic and atraumatic.     Right Ear: Tympanic membrane, ear canal and external ear normal.     Left Ear:  Tympanic membrane, ear canal and external ear normal.  Eyes:     General: No scleral icterus.    Conjunctiva/sclera: Conjunctivae normal.     Pupils: Pupils are equal, round, and reactive to light.  Neck:     Musculoskeletal: Neck supple.     Thyroid: No thyromegaly.  Cardiovascular:     Rate and Rhythm: Normal rate and regular rhythm.     Pulses: Normal pulses.     Heart sounds: Normal heart sounds. No murmur.  Pulmonary:     Effort: Pulmonary effort is normal. No respiratory distress.     Breath sounds: Normal breath sounds. No wheezing or rales.  Abdominal:     General: There is no distension.     Palpations: Abdomen is soft.     Tenderness: There is no abdominal tenderness.  Musculoskeletal:        General: No deformity.     Right lower leg: No edema.     Left lower leg: No edema.  Lymphadenopathy:     Cervical: No cervical adenopathy.  Skin:    General: Skin is warm and dry.     Capillary Refill: Capillary refill takes less than 2 seconds.     Findings: No rash.  Neurological:     Mental Status: She is alert and oriented to person, place, and time. Mental status is at baseline.  Psychiatric:        Mood and Affect: Mood normal.        Behavior: Behavior normal.        Thought Content: Thought content normal.      No results found for any visits on 08/11/19.     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   The entirety of the information documented in the History of Present Illness, Review of Systems  and Physical Exam were personally obtained by me. Portions of this information were initially documented by Ashley Royalty, CMA and reviewed by me for thoroughness and accuracy.    Earleen Aoun, [name], MD MPH [facility]

## 2019-08-11 NOTE — Patient Instructions (Signed)
Preventive Care 77 Years and Older, Female Preventive care refers to lifestyle choices and visits with your health care provider that can promote health and wellness. This includes:  A yearly physical exam. This is also called an annual well check.  Regular dental and eye exams.  Immunizations.  Screening for certain conditions.  Healthy lifestyle choices, such as diet and exercise. What can I expect for my preventive care visit? Physical exam Your health care provider will check:  Height and weight. These may be used to calculate body mass index (BMI), which is a measurement that tells if you are at a healthy weight.  Heart rate and blood pressure.  Your skin for abnormal spots. Counseling Your health care provider may ask you questions about:  Alcohol, tobacco, and drug use.  Emotional well-being.  Home and relationship well-being.  Sexual activity.  Eating habits.  History of falls.  Memory and ability to understand (cognition).  Work and work Statistician.  Pregnancy and menstrual history. What immunizations do I need?  Influenza (flu) vaccine  This is recommended every year. Tetanus, diphtheria, and pertussis (Tdap) vaccine  You may need a Td booster every 10 years. Varicella (chickenpox) vaccine  You may need this vaccine if you have not already been vaccinated. Zoster (shingles) vaccine  You may need this after age 33. Pneumococcal conjugate (PCV13) vaccine  One dose is recommended after age 33. Pneumococcal polysaccharide (PPSV23) vaccine  One dose is recommended after age 72. Measles, mumps, and rubella (MMR) vaccine  You may need at least one dose of MMR if you were born in 1957 or later. You may also need a second dose. Meningococcal conjugate (MenACWY) vaccine  You may need this if you have certain conditions. Hepatitis A vaccine  You may need this if you have certain conditions or if you travel or work in places where you may be exposed  to hepatitis A. Hepatitis B vaccine  You may need this if you have certain conditions or if you travel or work in places where you may be exposed to hepatitis B. Haemophilus influenzae type b (Hib) vaccine  You may need this if you have certain conditions. You may receive vaccines as individual doses or as more than one vaccine together in one shot (combination vaccines). Talk with your health care provider about the risks and benefits of combination vaccines. What tests do I need? Blood tests  Lipid and cholesterol levels. These may be checked every 5 years, or more frequently depending on your overall health.  Hepatitis C test.  Hepatitis B test. Screening  Lung cancer screening. You may have this screening every year starting at age 39 if you have a 30-pack-year history of smoking and currently smoke or have quit within the past 15 years.  Colorectal cancer screening. All adults should have this screening starting at age 36 and continuing until age 15. Your health care provider may recommend screening at age 23 if you are at increased risk. You will have tests every 1-10 years, depending on your results and the type of screening test.  Diabetes screening. This is done by checking your blood sugar (glucose) after you have not eaten for a while (fasting). You may have this done every 1-3 years.  Mammogram. This may be done every 1-2 years. Talk with your health care provider about how often you should have regular mammograms.  BRCA-related cancer screening. This may be done if you have a family history of breast, ovarian, tubal, or peritoneal cancers.  Other tests  Sexually transmitted disease (STD) testing.  Bone density scan. This is done to screen for osteoporosis. You may have this done starting at age 76. Follow these instructions at home: Eating and drinking  Eat a diet that includes fresh fruits and vegetables, whole grains, lean protein, and low-fat dairy products. Limit  your intake of foods with high amounts of sugar, saturated fats, and salt.  Take vitamin and mineral supplements as recommended by your health care provider.  Do not drink alcohol if your health care provider tells you not to drink.  If you drink alcohol: ? Limit how much you have to 0-1 drink a day. ? Be aware of how much alcohol is in your drink. In the [place] one drink equals one 12 oz bottle of beer (355 mL), one 5 oz glass of wine (148 mL), or one 1 oz glass of hard liquor (44 mL). Lifestyle  Take daily care of your teeth and gums.  Stay active. Exercise for at least 30 minutes on 5 or more days each week.  Do not use any products that contain nicotine or tobacco, such as cigarettes, e-cigarettes, and chewing tobacco. If you need help quitting, ask your health care provider.  If you are sexually active, practice safe sex. Use a condom or other form of protection in order to prevent STIs (sexually transmitted infections).  Talk with your health care provider about taking a low-dose aspirin or statin. What's next?  Go to your health care provider once a year for a well check visit.  Ask your health care provider how often you should have your eyes and teeth checked.  Stay up to date on all vaccines. This information is not intended to replace advice given to you by your health care provider. Make sure you discuss any questions you have with your health care provider. Document Released: 10/14/2015 Document Revised: 09/11/2018 Document Reviewed: 09/11/2018 Elsevier Patient Education  2020 [employer].

## 2019-08-11 NOTE — Assessment & Plan Note (Signed)
Well controlled Continue singulair and claritin

## 2019-08-11 NOTE — Assessment & Plan Note (Signed)
Well controlled Continue Symbicort prn and daily Claritin and Singulair

## 2019-08-12 ENCOUNTER — Telehealth: Payer: Self-pay

## 2019-08-12 ENCOUNTER — Ambulatory Visit
Admission: RE | Admit: 2019-08-12 | Discharge: 2019-08-12 | Disposition: A | Discharge status: Home / Self Care | Payer: Medicare Other | Source: Ambulatory Visit | Attending: Family Medicine | Admitting: Family Medicine

## 2019-08-12 DIAGNOSIS — Z1231 Encounter for screening mammogram for malignant neoplasm of breast: Secondary | ICD-10-CM | POA: Insufficient documentation

## 2019-08-12 LAB — LIPID PANEL
Chol/HDL Ratio: 3.2 ratio (ref 0.0–4.4)
Cholesterol, Total: 192 mg/dL (ref 100–199)
HDL: 60 mg/dL
LDL Chol Calc (NIH): 113 mg/dL — ABNORMAL HIGH (ref 0–99)
Triglycerides: 105 mg/dL (ref 0–149)
VLDL Cholesterol Cal: 19 mg/dL (ref 5–40)

## 2019-08-12 LAB — COMPREHENSIVE METABOLIC PANEL WITH GFR
ALT: 9 IU/L (ref 0–32)
AST: 23 IU/L (ref 0–40)
Albumin/Globulin Ratio: 2.2 (ref 1.2–2.2)
Albumin: 4.7 g/dL (ref 3.7–4.7)
Alkaline Phosphatase: 32 IU/L — ABNORMAL LOW (ref 39–117)
BUN/Creatinine Ratio: 17 (ref 12–28)
BUN: 12 mg/dL (ref 8–27)
Bilirubin Total: 0.9 mg/dL (ref 0.0–1.2)
CO2: 22 mmol/L (ref 20–29)
Calcium: 9.6 mg/dL (ref 8.7–10.3)
Chloride: 97 mmol/L (ref 96–106)
Creatinine, Ser: 0.72 mg/dL (ref 0.57–1.00)
GFR calc Af Amer: 93 mL/min/1.73
GFR calc non Af Amer: 81 mL/min/1.73
Globulin, Total: 2.1 g/dL (ref 1.5–4.5)
Glucose: 87 mg/dL (ref 65–99)
Potassium: 5 mmol/L (ref 3.5–5.2)
Sodium: 136 mmol/L (ref 134–144)
Total Protein: 6.8 g/dL (ref 6.0–8.5)

## 2019-08-12 LAB — CBC
Hematocrit: 41.9 % (ref 34.0–46.6)
Hemoglobin: 14.3 g/dL (ref 11.1–15.9)
MCH: 32 pg (ref 26.6–33.0)
MCHC: 34.1 g/dL (ref 31.5–35.7)
MCV: 94 fL (ref 79–97)
Platelets: 290 x10E3/uL (ref 150–450)
RBC: 4.47 x10E6/uL (ref 3.77–5.28)
RDW: 12.7 % (ref 11.7–15.4)
WBC: 7 x10E3/uL (ref 3.4–10.8)

## 2019-08-12 NOTE — Telephone Encounter (Signed)
Patient advised as below.  

## 2019-08-12 NOTE — Telephone Encounter (Signed)
-----   Message from Virginia Crews, MD sent at 08/12/2019  3:08 PM EST ----- Normal/stable labs

## 2019-08-13 ENCOUNTER — Telehealth: Payer: Self-pay

## 2019-08-13 NOTE — Telephone Encounter (Signed)
-----   Message from Virginia Crews, MD sent at 08/13/2019 11:50 AM EST ----- Normal mammogram. Repeat in 1 yr

## 2019-08-13 NOTE — Telephone Encounter (Signed)
Pt advised.   Thanks,   -Karen Henson  

## 2019-08-17 ENCOUNTER — Ambulatory Visit
Admission: RE | Admit: 2019-08-17 | Discharge: 2019-08-17 | Disposition: A | Discharge status: Home / Self Care | Payer: Medicare Other | Source: Ambulatory Visit | Attending: Family Medicine | Admitting: Family Medicine

## 2019-08-17 DIAGNOSIS — E2839 Other primary ovarian failure: Secondary | ICD-10-CM | POA: Diagnosis not present

## 2019-08-17 DIAGNOSIS — Z780 Asymptomatic menopausal state: Secondary | ICD-10-CM | POA: Diagnosis not present

## 2019-08-17 DIAGNOSIS — M810 Age-related osteoporosis without current pathological fracture: Secondary | ICD-10-CM | POA: Diagnosis not present

## 2019-08-18 ENCOUNTER — Telehealth: Payer: Self-pay

## 2019-08-18 NOTE — Telephone Encounter (Signed)
Patient advised as below. Patient reports she will call back to let us know if she wants to schedule with endo.

## 2019-08-18 NOTE — Telephone Encounter (Signed)
-----   Message from Virginia Crews, MD sent at 08/18/2019  8:38 AM EST ----- Bone density continues to worsen.  There are nonbisphosphonate treatments for osteoporosis now that are more accessible.  I would recommend endocrinology referral to discuss Prolia

## 2019-10-20 ENCOUNTER — Other Ambulatory Visit: Payer: Self-pay | Admitting: Internal Medicine

## 2019-11-03 ENCOUNTER — Other Ambulatory Visit: Payer: Self-pay | Admitting: Family Medicine

## 2019-11-03 NOTE — Telephone Encounter (Signed)
Requested Prescriptions  Pending Prescriptions Disp Refills  . losartan-hydrochlorothiazide (HYZAAR) 50-12.5 MG tablet [Pharmacy Med Name: LOSARTAN POTASSIUM-HCTZ 50-12.5 MG] 90 tablet 3    Sig: TAKE 1 TABLET BY MOUTH ONCE DAILY     Cardiovascular: ARB + Diuretic Combos Passed - 11/03/2019  9:04 AM      Passed - K in normal range and within 180 days    Potassium  Date Value Ref Range Status  08/11/2019 5.0 3.5 - 5.2 mmol/L Final  07/16/2013 4.4 3.5 - 5.1 mmol/L Final         Passed - Na in normal range and within 180 days    Sodium  Date Value Ref Range Status  08/11/2019 136 134 - 144 mmol/L Final         Passed - Cr in normal range and within 180 days    Creat  Date Value Ref Range Status  08/05/2017 0.77 0.60 - 0.93 mg/dL Final    Comment:    For patients >69 years of age, the reference limit for Creatinine is approximately 13% higher for people identified as African-American. .    Creatinine, Ser  Date Value Ref Range Status  08/11/2019 0.72 0.57 - 1.00 mg/dL Final         Passed - Ca in normal range and within 180 days    Calcium  Date Value Ref Range Status  08/11/2019 9.6 8.7 - 10.3 mg/dL Final         Passed - Patient is not pregnant      Passed - Last BP in normal range    BP Readings from Last 1 Encounters:  08/11/19 124/87         Passed - Valid encounter within last 6 months    Recent Outpatient Visits          2 months ago Essential hypertension   [facility] [place], [name], MD   8 months ago Essential hypertension   [place], [name], MD   11 months ago Recurrent UTI   [facility] Birdie Sons, MD   1 year ago Essential hypertension   [place], [name], MD   1 year ago [place], [place]      Future Appointments            In 3 months Bacigalupo, [name], MD [facility] [place]

## 2019-11-17 ENCOUNTER — Telehealth: Payer: Self-pay

## 2019-11-17 NOTE — Telephone Encounter (Signed)
Called confirmed virtual visit on 11/19/2019. klh

## 2019-11-19 ENCOUNTER — Other Ambulatory Visit: Payer: Self-pay

## 2019-11-19 ENCOUNTER — Ambulatory Visit (INDEPENDENT_AMBULATORY_CARE_PROVIDER_SITE_OTHER): Payer: Medicare Other | Admitting: Internal Medicine

## 2019-11-19 ENCOUNTER — Encounter: Payer: Self-pay | Admitting: Internal Medicine

## 2019-11-19 VITALS — BP 132/82 | HR 71 | Ht 62.0 in | Wt 132.0 lb

## 2019-11-19 DIAGNOSIS — J449 Chronic obstructive pulmonary disease, unspecified: Secondary | ICD-10-CM | POA: Diagnosis not present

## 2019-11-19 DIAGNOSIS — J84112 Idiopathic pulmonary fibrosis: Secondary | ICD-10-CM | POA: Diagnosis not present

## 2019-11-19 DIAGNOSIS — I10 Essential (primary) hypertension: Secondary | ICD-10-CM | POA: Diagnosis not present

## 2019-11-19 NOTE — Progress Notes (Signed)
[facility] [place], [place] [address]  Internal MEDICINE  Telephone Visit  Patient Name: Karen Henson  [id number]  [id number]  Date of Service: 11/19/2019  I connected with the patient at 1128 by telephone and verified the patients identity using two identifiers.   I discussed the limitations, risks, security and privacy concerns of performing an evaluation and management service by telephone and the availability of in person appointments. I also discussed with the patient that there may be a patient responsible charge related to the service.  The patient expressed understanding and agrees to proceed.    Chief Complaint  Patient presents with  . Telephone Screen  . Telephone Assessment  . Allergies    HPI  Pt is seen via telephone for pulmonary follow up.  Pt reports overall she is doing very well. She has had her Covid vaccine.  She denies any issues at this time.  She is breathing well, she has not had any issues with her allergies. She has inhalers, but has not needed them in quite some time.  She does take mucinex, Claritin and flonase daily.    Current Medication: Outpatient Encounter Medications as of 11/19/2019  Medication Sig  . acetaminophen (TYLENOL) 500 MG tablet Take 500 mg by mouth every 6 (six) hours as needed for moderate pain or headache.  . albuterol (VENTOLIN HFA) 108 (90 Base) MCG/ACT inhaler Inhale 2 puffs into the lungs every 6 (six) hours as needed for wheezing or shortness of breath.  . dextromethorphan-guaiFENesin (MUCINEX DM) 30-600 MG 12hr tablet Take 1 tablet by mouth daily.  Marland Kitchen loratadine (CLARITIN) 10 MG tablet Take 10 mg by mouth daily.   Marland Kitchen losartan-hydrochlorothiazide (HYZAAR) 50-12.5 MG tablet TAKE 1 TABLET BY MOUTH ONCE DAILY  . montelukast (SINGULAIR) 10 MG tablet TAKE 1 TABLET BY MOUTH ONCE DAILY AT NIGHT FOR ASTHMA.  . SYMBICORT 80-4.5 MCG/ACT inhaler INHALE 2 PUFFS INTO LUNGS TWICE DAILY. RINSE MOUTH AFTER USE   No  facility-administered encounter medications on file as of 11/19/2019.    Surgical History: Past Surgical History:  Procedure Laterality Date  . BREAST EXCISIONAL BIOPSY Left 1996   neg  . CATARACT EXTRACTION W/PHACO Left 10/31/2017   Procedure: CATARACT EXTRACTION PHACO AND INTRAOCULAR LENS PLACEMENT (IOC);  Surgeon: Eulogio Bear, MD;  Location: [facility] ORS;  Service: Ophthalmology;  Laterality: Left;  [place] 00:43.7 AP% 19.6 CDE 8.57 FLUID PACK LOT # [id number]  . CATARACT EXTRACTION W/PHACO Right 12/05/2017   Procedure: CATARACT EXTRACTION PHACO AND INTRAOCULAR LENS PLACEMENT (IOC);  Surgeon: Eulogio Bear, MD;  Location: [facility] ORS;  Service: Ophthalmology;  Laterality: Right;  fluid pack lot [id number]  exp 07/01/2019 [place]    00:31.4 AP%    8.8 CDE    2.79  . COLONOSCOPY  02/15/2006  . COLONOSCOPY WITH PROPOFOL N/A 03/21/2016   Procedure: COLONOSCOPY WITH PROPOFOL;  Surgeon: Robert Bellow, MD;  Location: [facility] ENDOSCOPY;  Service: Endoscopy;  Laterality: N/A;  . DILATION AND CURETTAGE OF UTERUS    . VARICOSE VEIN SURGERY      Medical History: Past Medical History:  Diagnosis Date  . Allergy    Environmental Allergies  . Arthritis   . Bronchitis, chronic ([place])   . Cough    CHRONIC  . Dyspnea    OCCAS  . Hypertension   . 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[place])   . Osteoporosis     Family History: Family History  Problem Relation Age of Onset  . Hypertension Mother   .  Emphysema Father   . Stroke Sister   . Stroke Brother   . Diabetes Maternal Aunt   . Diabetes Maternal Grandmother   . Lung cancer Grandchild   . Breast cancer Neg Hx   . Ovarian cancer Neg Hx   . Colon cancer Neg Hx     Social History   Socioeconomic History  . Marital status: Widowed    Spouse name: Not on file  . Number of children: 2  . Years of education: Not on file  . Highest education level: High school graduate  Occupational History  . Occupation: retired  Tobacco Use  . Smoking status: Former Smoker     Packs/day: 0.25    Types: Cigarettes  . Smokeless tobacco: Never Used  . Tobacco comment: smoked a pack a week, quit about 30 years ago  Substance and Sexual Activity  . Alcohol use: Yes    Comment: ocassional- monthly  . Drug use: No  . Sexual activity: Never  Other Topics Concern  . Not on file  Social History Narrative  . Not on file   Social Determinants of Health   Financial Resource Strain:   . Difficulty of Paying Living Expenses: Not on file  Food Insecurity:   . Worried About Charity fundraiser in the Last Year: Not on file  . Ran Out of Food in the Last Year: Not on file  Transportation Needs:   . Lack of Transportation (Medical): Not on file  . Lack of Transportation (Non-Medical): Not on file  Physical Activity: Inactive  . Days of Exercise per Week: 0 days  . Minutes of Exercise per Session: 0 min  Stress:   . Feeling of Stress : Not on file  Social Connections:   . Frequency of Communication with Friends and Family: Not on file  . Frequency of Social Gatherings with Friends and Family: Not on file  . Attends Religious Services: Not on file  . Active Member of Clubs or Organizations: Not on file  . Attends Archivist Meetings: Not on file  . Marital Status: Not on file  Intimate Partner Violence:   . Fear of Current or Ex-Partner: Not on file  . Emotionally Abused: Not on file  . Physically Abused: Not on file  . Sexually Abused: Not on file      Review of Systems  Constitutional: Negative for chills, fatigue and unexpected weight change.  HENT: Negative for congestion, rhinorrhea, sneezing and sore throat.   Eyes: Negative for photophobia, pain and redness.  Respiratory: Negative for cough, chest tightness and shortness of breath.   Cardiovascular: Negative for chest pain and palpitations.  Gastrointestinal: Negative for abdominal pain, constipation, diarrhea, nausea and vomiting.  Endocrine: Negative.   Genitourinary: Negative for  dysuria and frequency.  Musculoskeletal: Negative for arthralgias, back pain, joint swelling and neck pain.  Skin: Negative for rash.  Allergic/Immunologic: Negative.   Neurological: Negative for tremors and numbness.  Hematological: Negative for adenopathy. Does not bruise/bleed easily.  Psychiatric/Behavioral: Negative for behavioral problems and sleep disturbance. The patient is not nervous/anxious.     Vital Signs: BP 132/82   Pulse 71   Ht 5\' 2"  (1.575 m)   Wt 132 lb (59.9 kg)   BMI 24.14 kg/m    Observation/Objective:  Well sounding, NAD noted.    Assessment/Plan: 1. Chronic obstructive pulmonary disease, unspecified COPD type (HCC) Controlled, using symbicort PRN.    2. IPF (idiopathic pulmonary fibrosis) (HCC) Controlled, good relief of symptoms.  3. Essential hypertension Stable, continue present management.  General Counseling: Kooper verbalizes understanding of the findings of today's phone visit and agrees with plan of treatment. I have discussed any further diagnostic evaluation that may be needed or ordered today. We also reviewed her medications today. she has been encouraged to call the office with any questions or concerns that should arise related to todays visit.    No orders of the defined types were placed in this encounter.   No orders of the defined types were placed in this encounter.   Time spent: 25 Minutes    Orson Gear AGNP-C Pulmonary medicine

## 2020-01-21 ENCOUNTER — Other Ambulatory Visit: Payer: Self-pay | Admitting: Adult Health

## 2020-02-05 NOTE — Patient Instructions (Signed)

## 2020-02-05 NOTE — Progress Notes (Signed)
See note by Chipper Herb, medical student, above - attested by me

## 2020-02-08 ENCOUNTER — Other Ambulatory Visit: Payer: Self-pay

## 2020-02-08 ENCOUNTER — Encounter: Payer: Self-pay | Admitting: Family Medicine

## 2020-02-08 ENCOUNTER — Ambulatory Visit (INDEPENDENT_AMBULATORY_CARE_PROVIDER_SITE_OTHER): Payer: Medicare Other | Admitting: Family Medicine

## 2020-02-08 VITALS — BP 128/86 | HR 63 | Temp 96.9°F | Resp 16 | Ht 62.0 in | Wt 129.0 lb

## 2020-02-08 DIAGNOSIS — I10 Essential (primary) hypertension: Secondary | ICD-10-CM

## 2020-02-08 NOTE — Progress Notes (Signed)
Established patient visit   Patient: Karen Henson   DOB: 09-28-1941   79 y.o. Female  MRN: [id number] Visit Date: 02/08/2020  Today's healthcare provider: Lavon Paganini, MD   Chief Complaint  Patient presents with  . Hypertension   Subjective    HPI  Hypertension, follow-up:  BP Readings from Last 3 Encounters:  02/08/20 128/86  11/19/19 132/82  08/11/19 124/87    She was last seen for hypertension 6 months ago.  BP at that visit was 124/87. Management changes/ since that visit include continuing Losartan-HCTZ. She reports excellent compliance with treatment. She is not having side effects.  She is exercising. She is adherent to low salt diet.   She is experiencing none.  Patient denies chest pain, chest pressure/discomfort, claudication, dyspnea, exertional chest pressure/discomfort, fatigue, irregular heart beat, lower extremity edema, near-syncope, palpitations, syncope and tachypnea.   Cardiovascular risk factors include advanced age (older than 32 for men, 71 for women) and hypertension.  Use of agents associated with hypertension: none.     Weight trend: stable Wt Readings from Last 3 Encounters:  02/08/20 129 lb (58.5 kg)  11/19/19 132 lb (59.9 kg)  08/11/19 127 lb (57.6 kg)    Current diet: in general, a "healthy" diet    ------------------------------------------------------------------------    Social History   Tobacco Use  . Smoking status: Former Smoker    Packs/day: 0.25    Types: Cigarettes  . Smokeless tobacco: Never Used  . Tobacco comment: smoked a pack a week, quit about 30 years ago  Substance Use Topics  . Alcohol use: Yes    Comment: ocassional- monthly  . Drug use: No   Social History   Socioeconomic History  . Marital status: Widowed    Spouse name: Not on file  . Number of children: 2  . Years of education: Not on file  . Highest education level: High school graduate  Occupational History  . Occupation:  retired  Tobacco Use  . Smoking status: Former Smoker    Packs/day: 0.25    Types: Cigarettes  . Smokeless tobacco: Never Used  . Tobacco comment: smoked a pack a week, quit about 30 years ago  Substance and Sexual Activity  . Alcohol use: Yes    Comment: ocassional- monthly  . Drug use: No  . Sexual activity: Never  Other Topics Concern  . Not on file  Social History Narrative  . Not on file   Social Determinants of Health   Financial Resource Strain:   . Difficulty of Paying Living Expenses:   Food Insecurity:   . Worried About Charity fundraiser in the Last Year:   . Arboriculturist in the Last Year:   Transportation Needs:   . Film/video editor (Medical):   Marland Kitchen Lack of Transportation (Non-Medical):   Physical Activity: Inactive  . Days of Exercise per Week: 0 days  . Minutes of Exercise per Session: 0 min  Stress:   . Feeling of Stress :   Social Connections:   . Frequency of Communication with Friends and Family:   . Frequency of Social Gatherings with Friends and Family:   . Attends Religious Services:   . Active Member of Clubs or Organizations:   . Attends Archivist Meetings:   Marland Kitchen Marital Status:   Intimate Partner Violence:   . Fear of Current or Ex-Partner:   . Emotionally Abused:   Marland Kitchen Physically Abused:   . Sexually Abused:  Medications: Outpatient Medications Prior to Visit  Medication Sig  . acetaminophen (TYLENOL) 500 MG tablet Take 500 mg by mouth every 6 (six) hours as needed for moderate pain or headache.  . albuterol (VENTOLIN HFA) 108 (90 Base) MCG/ACT inhaler Inhale 2 puffs into the lungs every 6 (six) hours as needed for wheezing or shortness of breath.  . dextromethorphan-guaiFENesin (MUCINEX DM) 30-600 MG 12hr tablet Take 1 tablet by mouth daily.  Marland Kitchen loratadine (CLARITIN) 10 MG tablet Take 10 mg by mouth daily.   Marland Kitchen losartan-hydrochlorothiazide (HYZAAR) 50-12.5 MG tablet TAKE 1 TABLET BY MOUTH ONCE DAILY  . montelukast  (SINGULAIR) 10 MG tablet TAKE 1 TABLET BY MOUTH BEDTIME FOR ASTHMA  . SYMBICORT 80-4.5 MCG/ACT inhaler INHALE 2 PUFFS INTO LUNGS TWICE DAILY. RINSE MOUTH AFTER USE   No facility-administered medications prior to visit.    Review of Systems  Constitutional: Negative.   HENT: Negative.   Eyes: Negative.   Respiratory: Negative.   Cardiovascular: Negative.   Gastrointestinal: Negative.   Endocrine: Negative.   Genitourinary: Negative.   Musculoskeletal: Negative.   Skin: Negative.   Allergic/Immunologic: Negative.   Neurological: Negative.   Hematological: Negative.   Psychiatric/Behavioral: Negative.     Last metabolic panel Lab Results  Component Value Date   GLUCOSE 87 08/11/2019   NA 136 08/11/2019   K 5.0 08/11/2019   CL 97 08/11/2019   CO2 22 08/11/2019   BUN 12 08/11/2019   CREATININE 0.72 08/11/2019   GFRNONAA 81 08/11/2019   GFRAA 93 08/11/2019   CALCIUM 9.6 08/11/2019   PROT 6.8 08/11/2019   ALBUMIN 4.7 08/11/2019   LABGLOB 2.1 08/11/2019   AGRATIO 2.2 08/11/2019   BILITOT 0.9 08/11/2019   ALKPHOS 32 (L) 08/11/2019   AST 23 08/11/2019   ALT 9 08/11/2019   Last lipids Lab Results  Component Value Date   CHOL 192 08/11/2019   HDL 60 08/11/2019   LDLCALC 113 (H) 08/11/2019   TRIG 105 08/11/2019   CHOLHDL 3.2 08/11/2019      Objective    BP 128/86 (BP Location: Left Arm, Patient Position: Sitting, Cuff Size: Normal)   Pulse 63   Temp (!) 96.9 F (36.1 C) (Temporal)   Resp 16   Ht 5\' 2"  (1.575 m)   Wt 129 lb (58.5 kg)   SpO2 98%   BMI 23.59 kg/m  BP Readings from Last 3 Encounters:  02/08/20 128/86  11/19/19 132/82  08/11/19 124/87   Wt Readings from Last 3 Encounters:  02/08/20 129 lb (58.5 kg)  11/19/19 132 lb (59.9 kg)  08/11/19 127 lb (57.6 kg)      Physical Exam Constitutional:      Appearance: Normal appearance. She is normal weight.  HENT:     Head: Normocephalic and atraumatic.  Cardiovascular:     Rate and Rhythm: Normal  rate and regular rhythm.     Pulses: Normal pulses.     Heart sounds: Normal heart sounds.  Pulmonary:     Effort: Pulmonary effort is normal.     Breath sounds: Normal breath sounds.  Abdominal:     General: Abdomen is flat.     Palpations: Abdomen is soft.  Musculoskeletal:     Cervical back: Neck supple.     Right lower leg: No edema.     Left lower leg: No edema.  Lymphadenopathy:     Cervical: No cervical adenopathy.  Skin:    General: Skin is warm and dry.  Neurological:  General: No focal deficit present.     Mental Status: She is alert and oriented to person, place, and time.  Psychiatric:        Mood and Affect: Mood normal.        Behavior: Behavior normal.      No results found for any visits on 02/08/20.  Assessment & Plan     Problem List Items Addressed This Visit      Cardiovascular and Mediastinum   BP (high blood pressure) - Primary    Pt with a history of hypertension well controlled on medication Pt is currently taking Losartan-HCTZ 50-12.5 BP today is 128/86  Plan: Continue taking medication as prescribed Will check BMP Will follow up in 6 months      Relevant Orders   Basic Metabolic Panel (BMET)       Return in about 6 months (around 08/10/2020) for Chronic follow up and Physical.      Chipper Herb, [place] [phone] (phone) [phone] (fax)  [place]

## 2020-02-08 NOTE — Assessment & Plan Note (Signed)
Pt with a history of hypertension well controlled on medication Pt is currently taking Losartan-HCTZ 50-12.5 BP today is 128/86  Plan: Continue taking medication as prescribed Will check BMP Will follow up in 6 months

## 2020-02-09 ENCOUNTER — Telehealth: Payer: Self-pay

## 2020-02-09 LAB — BASIC METABOLIC PANEL
BUN/Creatinine Ratio: 15 (ref 12–28)
BUN: 10 mg/dL (ref 8–27)
CO2: 22 mmol/L (ref 20–29)
Calcium: 9.6 mg/dL (ref 8.7–10.3)
Chloride: 99 mmol/L (ref 96–106)
Creatinine, Ser: 0.67 mg/dL (ref 0.57–1.00)
GFR calc Af Amer: 97 mL/min/{1.73_m2} (ref >59)
GFR calc non Af Amer: 84 mL/min/{1.73_m2} (ref >59)
Glucose: 85 mg/dL (ref 65–99)
Potassium: 4.9 mmol/L (ref 3.5–5.2)
Sodium: 135 mmol/L (ref 134–144)

## 2020-02-09 NOTE — Telephone Encounter (Signed)
Pt advised.   Thanks,   -Kalinda Romaniello  

## 2020-02-09 NOTE — Telephone Encounter (Signed)
-----   Message from Virginia Crews, MD sent at 02/09/2020  8:15 AM EDT ----- Normal labs

## 2020-05-18 DIAGNOSIS — H26492 Other secondary cataract, left eye: Secondary | ICD-10-CM | POA: Diagnosis not present

## 2020-05-24 ENCOUNTER — Ambulatory Visit (INDEPENDENT_AMBULATORY_CARE_PROVIDER_SITE_OTHER): Payer: Medicare Other | Admitting: Internal Medicine

## 2020-05-24 ENCOUNTER — Encounter: Payer: Self-pay | Admitting: Internal Medicine

## 2020-05-24 ENCOUNTER — Other Ambulatory Visit: Payer: Self-pay

## 2020-05-24 VITALS — BP 128/82 | HR 68 | Temp 97.1°F | Resp 16 | Ht 62.0 in | Wt 132.8 lb

## 2020-05-24 DIAGNOSIS — J84112 Idiopathic pulmonary fibrosis: Secondary | ICD-10-CM

## 2020-05-24 DIAGNOSIS — R059 Cough, unspecified: Secondary | ICD-10-CM

## 2020-05-24 DIAGNOSIS — I10 Essential (primary) hypertension: Secondary | ICD-10-CM | POA: Diagnosis not present

## 2020-05-24 DIAGNOSIS — R05 Cough: Secondary | ICD-10-CM

## 2020-05-24 DIAGNOSIS — J301 Allergic rhinitis due to pollen: Secondary | ICD-10-CM

## 2020-05-24 NOTE — Progress Notes (Signed)
[facility] [place], [place] [address]  Pulmonary Sleep Medicine   Office Visit Note  Patient Name: Karen Henson DOB: Jul 31, 1941 MRN [id number]  Date of Service: 05/24/2020  Complaints/HPI: She is being followed up for IPF. She is doing well no admissions to the hospital. She denies having cough no CP noted. No fevers noted  ROS  General: (-) fever, (-) chills, (-) night sweats, (-) weakness Skin: (-) rashes, (-) itching,. Eyes: (-) visual changes, (-) redness, (-) itching. Nose and Sinuses: (-) nasal stuffiness or itchiness, (-) postnasal drip, (-) nosebleeds, (-) sinus trouble. Mouth and Throat: (-) sore throat, (-) hoarseness. Neck: (-) swollen glands, (-) enlarged thyroid, (-) neck pain. Respiratory: - cough, (-) bloody sputum, + shortness of breath, - wheezing. Cardiovascular: - ankle swelling, (-) chest pain. Lymphatic: (-) lymph node enlargement. Neurologic: (-) numbness, (-) tingling. Psychiatric: (-) anxiety, (-) depression   Current Medication: Outpatient Encounter Medications as of 05/24/2020  Medication Sig  . acetaminophen (TYLENOL) 500 MG tablet Take 500 mg by mouth every 6 (six) hours as needed for moderate pain or headache.  . albuterol (VENTOLIN HFA) 108 (90 Base) MCG/ACT inhaler Inhale 2 puffs into the lungs every 6 (six) hours as needed for wheezing or shortness of breath.  . dextromethorphan-guaiFENesin (MUCINEX DM) 30-600 MG 12hr tablet Take 1 tablet by mouth daily.  Marland Kitchen loratadine (CLARITIN) 10 MG tablet Take 10 mg by mouth daily.   Marland Kitchen losartan-hydrochlorothiazide (HYZAAR) 50-12.5 MG tablet TAKE 1 TABLET BY MOUTH ONCE DAILY  . montelukast (SINGULAIR) 10 MG tablet TAKE 1 TABLET BY MOUTH BEDTIME FOR ASTHMA  . SYMBICORT 80-4.5 MCG/ACT inhaler INHALE 2 PUFFS INTO LUNGS TWICE DAILY. RINSE MOUTH AFTER USE   No facility-administered encounter medications on file as of 05/24/2020.    Surgical History: Past Surgical History:   Procedure Laterality Date  . BREAST EXCISIONAL BIOPSY Left 1996   neg  . CATARACT EXTRACTION W/PHACO Left 10/31/2017   Procedure: CATARACT EXTRACTION PHACO AND INTRAOCULAR LENS PLACEMENT (IOC);  Surgeon: Eulogio Bear, MD;  Location: [facility] ORS;  Service: Ophthalmology;  Laterality: Left;  [place] 00:43.7 AP% 19.6 CDE 8.57 FLUID PACK LOT # [id number]  . CATARACT EXTRACTION W/PHACO Right 12/05/2017   Procedure: CATARACT EXTRACTION PHACO AND INTRAOCULAR LENS PLACEMENT (IOC);  Surgeon: Eulogio Bear, MD;  Location: [facility] ORS;  Service: Ophthalmology;  Laterality: Right;  fluid pack lot #[id number] H  exp 07/01/2019 [place]    00:31.4 AP%    8.8 CDE    2.79  . COLONOSCOPY  02/15/2006  . COLONOSCOPY WITH PROPOFOL N/A 03/21/2016   Procedure: COLONOSCOPY WITH PROPOFOL;  Surgeon: Robert Bellow, MD;  Location: [facility] ENDOSCOPY;  Service: Endoscopy;  Laterality: N/A;  . DILATION AND CURETTAGE OF UTERUS    . VARICOSE VEIN SURGERY      Medical History: Past Medical History:  Diagnosis Date  . Allergy    Environmental Allergies  . Arthritis   . Bronchitis, chronic ([place])   . Cough    CHRONIC  . Dyspnea    OCCAS  . Hypertension   . 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 Osteoporosis     Family History: Family History  Problem Relation Age of Onset  . Hypertension Mother   . Emphysema Father   . Stroke Sister   . Stroke Brother   . Diabetes Maternal Aunt   . Diabetes Maternal Grandmother   . Lung cancer Grandchild   . Breast cancer Neg Hx   . Ovarian  cancer Neg Hx   . Colon cancer Neg Hx     Social History: Social History   Socioeconomic History  . Marital status: Widowed    Spouse name: Not on file  . Number of children: 2  . Years of education: Not on file  . Highest education level: High school graduate  Occupational History  . Occupation: retired  Tobacco Use  . Smoking status: Former Smoker    Packs/day: 0.25    Types: Cigarettes  . Smokeless tobacco: Never Used  . Tobacco comment:  smoked a pack a week, quit about 30 years ago  Vaping Use  . Vaping Use: Never used  Substance and Sexual Activity  . Alcohol use: Yes    Comment: ocassional- monthly  . Drug use: No  . Sexual activity: Never  Other Topics Concern  . Not on file  Social History Narrative  . Not on file   Social Determinants of Health   Financial Resource Strain:   . Difficulty of Paying Living Expenses: Not on file  Food Insecurity:   . Worried About Charity fundraiser in the Last Year: Not on file  . Ran Out of Food in the Last Year: Not on file  Transportation Needs:   . Lack of Transportation (Medical): Not on file  . Lack of Transportation (Non-Medical): Not on file  Physical Activity: Inactive  . Days of Exercise per Week: 0 days  . Minutes of Exercise per Session: 0 min  Stress:   . Feeling of Stress : Not on file  Social Connections:   . Frequency of Communication with Friends and Family: Not on file  . Frequency of Social Gatherings with Friends and Family: Not on file  . Attends Religious Services: Not on file  . Active Member of Clubs or Organizations: Not on file  . Attends Archivist Meetings: Not on file  . Marital Status: Not on file  Intimate Partner Violence:   . Fear of Current or Ex-Partner: Not on file  . Emotionally Abused: Not on file  . Physically Abused: Not on file  . Sexually Abused: Not on file    Vital Signs: Blood pressure 128/82, pulse 68, temperature (!) 97.1 F (36.2 C), resp. rate 16, height 5\' 2"  (1.575 m), weight 132 lb 12.8 oz (60.2 kg), SpO2 99 %.  Examination: General Appearance: The patient is well-developed, well-nourished, and in no distress. Skin: Gross inspection of skin unremarkable. Head: normocephalic, no gross deformities. Eyes: no gross deformities noted. ENT: ears appear grossly normal no exudates. Neck: Supple. No thyromegaly. No LAD. Respiratory: few crackles noted. Cardiovascular: Normal S1 and S2 without murmur or  rub. Extremities: No cyanosis. pulses are equal. Neurologic: Alert and oriented. No involuntary movements.  LABS: No results found for this or any previous visit (from the past 2160 hour(s)).  Radiology: DEXAScan  Result Date: 08/17/2019 EXAM: DUAL X-RAY ABSORPTIOMETRY (DXA) FOR BONE MINERAL DENSITY IMPRESSION: Technologist: MTB Your patient Karen Henson completed a BMD test on 08/17/2019 using the [place] (analysis version: 14.10) manufactured by [employer]. The following summarizes the results of our evaluation. PATIENT BIOGRAPHICAL: Name: Taylinn, Brabant Patient ID: [id number] Birth Date: 1941/05/09 Height: 62.0 in. Gender: Female Exam Date: 08/17/2019 Weight: 127.0 lbs. Indications: Family Hx of Osteoporosis, Postmenopausal, Caucasian, Advanced Age Fractures: Treatments: Calcium, Singulair, Symbicort, Vitamin D ASSESSMENT: The BMD measured at Forearm Radius 33% is 0.433 g/cm2 with a T-score of -5.1. This patient is considered osteoporotic according to World  Health Organization [facility]) criteria. The scan quality is good. L-4 was excluded due to degenerative changes. Site Region Measured Measured WHO Young Adult BMD Date       Age      Classification T-score AP Spine L1-L3 08/17/2019 77.7 Osteoporosis -3.1 0.806 g/cm2 AP Spine L1-L3 02/20/2016 74.2 Osteoporosis -3.0 0.809 g/cm2 AP Spine L1-L3 06/09/2013 71.5 Osteoporosis -2.6 0.868 g/cm2 DualFemur Neck Left 08/17/2019 77.7 Osteoporosis -3.2 0.597 g/cm2 DualFemur Neck Left 02/20/2016 74.2 Osteoporosis -3.0 0.617 g/cm2 DualFemur Neck Left 06/09/2013 71.5 Osteoporosis -2.8 0.652 g/cm2 DualFemur Total Mean 08/17/2019 77.7 Osteoporosis -2.7 0.669 g/cm2 DualFemur Total Mean 02/20/2016 74.2 Osteopenia -2.4 0.703 g/cm2 DualFemur Total Mean 06/09/2013 71.5 Osteopenia -2.3 0.716 g/cm2 Left Forearm Radius 33% 08/17/2019 77.7 Osteoporosis -5.1 0.433 g/cm2 Left Forearm Radius 33% 06/09/2013 71.5 Osteoporosis -3.4 0.575 g/cm2 World Health Organization  [facility]) criteria for post-menopausal, Caucasian Women: Normal:       T-score at or above -1 SD Osteopenia:   T-score between -1 and -2.5 SD Osteoporosis: T-score at or below -2.5 SD RECOMMENDATIONS: 1. All patients should optimize calcium and vitamin D intake. 2. Consider FDA-approved medical therapies in postmenopausal women and men aged 55 years and older, based on the following: a. A hip or vertebral(clinical or morphometric) fracture b. T-score < -2.5 at the femoral neck or spine after appropriate evaluation to exclude secondary causes c. Low bone mass (T-score between -1.0 and -2.5 at the femoral neck or spine) and a 10-year probability of a hip fracture > 3% or a 10-year probability of a major osteoporosis-related fracture > 20% based on the [place]-adapted WHO algorithm d. Clinician judgment and/or patient preferences may indicate treatment for people with 10-year fracture probabilities above or below these levels FOLLOW-UP: People with diagnosed cases of osteoporosis or at high risk for fracture should have regular bone mineral density tests. For patients eligible for Medicare, routine testing is allowed once every 2 years. The testing frequency can be increased to one year for patients who have rapidly progressing disease, those who are receiving or discontinuing medical therapy to restore bone mass, or have additional risk factors. I have reviewed this report, and agree with the above findings. [facility] Radiology Electronically Signed   By: Lowella Grip III M.D.   On: 08/17/2019 13:44    No results found.  No results found.    Assessment and Plan: Patient Active Problem List   Diagnosis Date Noted  . Chronic cough 08/05/2017  . 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 History of adenomatous polyp of colon 05/30/2016  . Allergic rhinitis 11/09/2015  . 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 BP (high blood pressure) 11/09/2015  . 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 OP (osteoporosis) 11/09/2015    1. IPF under control at this time. Will continuewith present therapy. She is to remain as active as possible. We have discussed medical therapy with antifibrotic. She does use inhalers very rarely. Only when she has a cold or congestion. She has good control noted 2. Cough occasional not as bad now will monitor 3. HTN per PCP she is on losartanHCTZ 4. Allergies under control with claritin at this time no issues noted  General Counseling: I have discussed the findings of the evaluation and examination with [place].  I have also discussed any further diagnostic evaluation thatmay be needed or ordered today. Deitra verbalizes understanding of the findings of todays visit. We also reviewed her medications today and discussed drug interactions  and side effects including but not limited excessive drowsiness and altered mental states. We also discussed that there is always a risk not just to her but also people around her. she has been encouraged to call the office with any questions or concerns that should arise related to todays visit.  Orders Placed This Encounter  Procedures  . Spirometry with Graph    Order Specific Question:   Where should this test be performed?    Answer:   [facility]    Order Specific Question:   Basic spirometry    Answer:   Yes    Order Specific Question:   Spirometry pre & post bronchodilator    Answer:   No     Time spent: 79min  I have personally obtained a history, examined the patient, evaluated laboratory and imaging results, formulated the assessment and plan and placed orders.    Allyne Gee, MD [facility] Pulmonary and Critical Care Sleep medicine

## 2020-05-24 NOTE — Patient Instructions (Signed)
Pulmonary Fibrosis  Pulmonary fibrosis is a type of lung disease that causes scarring. Over time, the scar tissue builds up in the air sacs of your lungs (alveoli). This makes it hard for you to breathe. Less oxygen can get into your blood. Scarring from pulmonary fibrosis gets worse over time. This damage is permanent and may lead to other serious health problems. What are the causes? There are many different causes of pulmonary fibrosis. Sometimes the cause is not known. This is called idiopathic pulmonary fibrosis. Other causes include:  Exposure to chemicals and substances found in agricultural, farm, construction, or factory work. These include mold, asbestos, silica, metal dusts, and toxic fumes.  Sarcoidosis. In this disease, areas of inflammatory cells (granulomas) form and most often affect the lungs.  Autoimmune diseases. These include diseases such as rheumatoid arthritis, systemic sclerosis, or connective tissue disease.  Taking certain medicines. These include drugs used in radiation therapy or used to treat seizures, heart problems, and some infections. What increases the risk? You are more likely to develop this condition if:  You have a family history of the disease.  You are older. The condition is more common in older adults.  You have a history of smoking.  You have a job that exposes you to certain chemicals.  You have gastroesophageal reflux disease (GERD). What are the signs or symptoms? Symptoms of this condition include:  Difficulty breathing that gets worse with activity.  Shortness of breath (dyspnea).  Dry, hacking cough.  Rapid, shallow breathing during exercise or while at rest.  Bluish skin and lips.  Loss of appetite.  Weakness.  Weight loss and fatigue.  Rounded and enlarged fingertips (clubbing). How is this diagnosed? This condition may be diagnosed based on:  Your symptoms and medical history.  A physical exam. You may also have  tests, including:  A test that involves looking inside your lungs with an instrument (bronchoscopy).  Imaging studies of your lungs and heart.  Tests to measure how well you are breathing (pulmonary function tests).  Blood tests.  Tests to see how well your lungs work while you are walking (pulmonary stress test).  A procedure to remove a lung tissue sample to look at it under a microscope (biopsy). How is this treated? There is no cure for pulmonary fibrosis. Treatment focuses on managing symptoms and preventing scarring from getting worse. This may include:  Medicines, such as: ? Steroids to prevent permanent lung changes. ? Medicines to suppress your body's defense system (immune system). ? Medicines to help with lung function by reducing inflammation or scarring.  Ongoing monitoring with X-rays and lab work.  Oxygen therapy.  Pulmonary rehabilitation.  Surgery. In some cases, a lung transplant is possible. Follow these instructions at home:     Medicines  Take over-the-counter and prescription medicines only as told by your health care provider.  Keep your vaccinations up to date as recommended by your health care provider. General instructions  Do not use any products that contain nicotine or tobacco, such as cigarettes and e-cigarettes. If you need help quitting, ask your health care provider.  Get regular exercise, but do not overexert yourself. Ask your health care provider to suggest some activities that are safe for you to do. ? If you have physical limitations, you may get exercise by walking, using a stationary bike, or doing chair exercises. ? Ask your health care provider about using oxygen while exercising.  If you are exposed to chemicals and substances at work,   make sure that you wear a mask or respirator at all times.  Join a pulmonary rehabilitation program or a support group for people with pulmonary fibrosis.  Eat small meals often so you do not  get too full. Overeating can make breathing trouble worse.  Maintain a healthy weight. Lose weight if you need to.  Do breathing exercises as directed by your health care provider.  Keep all follow-up visits as told by your health care provider. This is important. Contact a health care provider if you:  Have symptoms that do not get better with medicines.  Are not able to be as active as usual.  Have trouble taking a deep breath.  Have a fever or chills.  Have blue lips or skin.  Have clubbing of your fingers. Get help right away if you:  Have a sudden worsening of your symptoms.  Have chest pain.  Cough up mucus that is dark in color.  Have a lot of headaches.  Get very confused or sleepy. Summary  Pulmonary fibrosis is a type of lung disease that causes scar tissue to build up in the air sacs of your lungs (alveoli) over time. Less oxygen can get into your blood. This makes it hard for you to breathe.  Scarring from pulmonary fibrosis gets worse over time. This damage is permanent and may lead to other serious health problems.  You are more likely to develop this condition if you have a family history of the condition or a job that exposes you to certain chemicals.  There is no cure for pulmonary fibrosis. Treatment focuses on managing symptoms and preventing scarring from getting worse. This information is not intended to replace advice given to you by your health care provider. Make sure you discuss any questions you have with your health care provider. Document Revised: 10/23/2017 Document Reviewed: 10/23/2017 Elsevier Patient Education  2020 [employer].

## 2020-07-05 ENCOUNTER — Other Ambulatory Visit: Payer: Self-pay | Admitting: Family Medicine

## 2020-07-05 DIAGNOSIS — Z1231 Encounter for screening mammogram for malignant neoplasm of breast: Secondary | ICD-10-CM

## 2020-07-12 ENCOUNTER — Other Ambulatory Visit: Payer: Self-pay | Admitting: Adult Health

## 2020-07-12 DIAGNOSIS — Z23 Encounter for immunization: Secondary | ICD-10-CM | POA: Diagnosis not present

## 2020-07-21 DIAGNOSIS — H26491 Other secondary cataract, right eye: Secondary | ICD-10-CM | POA: Diagnosis not present

## 2020-08-10 NOTE — Progress Notes (Signed)
Subjective:   Karen Henson is a 79 y.o. female who presents for Medicare Annual (Subsequent) preventive examination.  I connected with Karen Henson today by telephone and verified that I am speaking with the correct person using two identifiers. Location patient: home Location provider: work Persons participating in the virtual visit: patient, provider.   I discussed the limitations, risks, security and privacy concerns of performing an evaluation and management service by telephone and the availability of in person appointments. I also discussed with the patient that there may be a patient responsible charge related to this service. The patient expressed understanding and verbally consented to this telephonic visit.    Interactive audio and video telecommunications were attempted between this provider and patient, however failed, due to patient having technical difficulties OR patient did not have access to video capability.  We continued and completed visit with audio only.   Review of Systems    N/A  Cardiac Risk Factors include: advanced age (>76men, >57 women);hypertension     Objective:    There were no vitals filed for this visit. There is no height or weight on file to calculate BMI.  Advanced Directives 08/11/2020 08/10/2019 08/06/2018 10/31/2017 07/23/2017 01/30/2016  Does Patient Have a Medical Advance Directive? Yes Yes Yes Yes Yes Yes  Type of Karen Henson;Living will Karen Henson;Living will Living will;Healthcare Power of Karen Henson;Living will [place];Living will [place];Living will  Copy of [place] in Chart? No - copy requested No - copy requested No - copy requested No - copy requested No - copy requested -    Current Medications (verified) Outpatient Encounter Medications as of 08/11/2020  Medication Sig  . acetaminophen  (TYLENOL) 500 MG tablet Take 500 mg by mouth every 6 (six) hours as needed for moderate pain or headache.  . albuterol (VENTOLIN HFA) 108 (90 Base) MCG/ACT inhaler Inhale 2 puffs into the lungs every 6 (six) hours as needed for wheezing or shortness of breath.  . dextromethorphan-guaiFENesin (MUCINEX DM) 30-600 MG 12hr tablet Take 1 tablet by mouth daily.  Marland Kitchen loratadine (CLARITIN) 10 MG tablet Take 10 mg by mouth daily.   Marland Kitchen losartan-hydrochlorothiazide (HYZAAR) 50-12.5 MG tablet TAKE 1 TABLET BY MOUTH ONCE DAILY  . montelukast (SINGULAIR) 10 MG tablet TAKE 1 TABLET BY MOUTH BEDTIME FOR ASTHMA  . SYMBICORT 80-4.5 MCG/ACT inhaler INHALE 2 PUFFS INTO LUNGS TWICE DAILY. RINSE MOUTH AFTER USE   No facility-administered encounter medications on file as of 08/11/2020.    Allergies (verified) Lisinopril   History: Past Medical History:  Diagnosis Date  . Allergy    Environmental Allergies  . Arthritis   . Bronchitis, chronic ([place])   . Cough    CHRONIC  . Dyspnea    OCCAS  . Hypertension   . 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[place])   . Osteoporosis    Past Surgical History:  Procedure Laterality Date  . BREAST EXCISIONAL BIOPSY Left 1996   neg  . CAPSULOTOMY    . CATARACT EXTRACTION W/PHACO Left 10/31/2017   Procedure: CATARACT EXTRACTION PHACO AND INTRAOCULAR LENS PLACEMENT (IOC);  Surgeon: Eulogio Bear, MD;  Location: [facility] ORS;  Service: Ophthalmology;  Laterality: Left;  [place] 00:43.7 AP% 19.6 CDE 8.57 FLUID PACK LOT # [id number]  . CATARACT EXTRACTION W/PHACO Right 12/05/2017   Procedure: CATARACT EXTRACTION PHACO AND INTRAOCULAR LENS PLACEMENT (IOC);  Surgeon: Eulogio Bear, MD;  Location: [facility] ORS;  Service: Ophthalmology;  Laterality: Right;  fluid pack lot #[id number] H  exp 07/01/2019 [place]    00:31.4 AP%    8.8 CDE    2.79  . COLONOSCOPY  02/15/2006  . COLONOSCOPY WITH PROPOFOL N/A 03/21/2016   Procedure: COLONOSCOPY WITH PROPOFOL;  Surgeon: Robert Bellow, MD;  Location: [facility] ENDOSCOPY;   Service: Endoscopy;  Laterality: N/A;  . DILATION AND CURETTAGE OF UTERUS    . VARICOSE VEIN SURGERY     Family History  Problem Relation Age of Onset  . Hypertension Mother   . Emphysema Father   . Stroke Sister   . Stroke Brother   . Diabetes Maternal Aunt   . Diabetes Maternal Grandmother   . Lung cancer Grandchild   . Breast cancer Neg Hx   . Ovarian cancer Neg Hx   . Colon cancer Neg Hx    Social History   Socioeconomic History  . Marital status: Widowed    Spouse name: Not on file  . Number of children: 2  . Years of education: Not on file  . Highest education level: High school graduate  Occupational History  . Occupation: retired  Tobacco Use  . Smoking status: Former Smoker    Packs/day: 0.25    Types: Cigarettes  . Smokeless tobacco: Never Used  . Tobacco comment: smoked a pack a week, quit about 30 years ago  Vaping Use  . Vaping Use: Never used  Substance and Sexual Activity  . Alcohol use: Yes    Comment: ocassional- monthly  . Drug use: No  . Sexual activity: Never  Other Topics Concern  . Not on file  Social History Narrative  . Not on file   Social Determinants of Health   Financial Resource Strain: Low Risk   . Difficulty of Paying Living Expenses: Not hard at all  Food Insecurity: No Food Insecurity  . Worried About Charity fundraiser in the Last Year: Never true  . Ran Out of Food in the Last Year: Never true  Transportation Needs: No Transportation Needs  . Lack of Transportation (Medical): No  . Lack of Transportation (Non-Medical): No  Physical Activity: Sufficiently Active  . Days of Exercise per Week: 5 days  . Minutes of Exercise per Session: 30 min  Stress: No Stress Concern Present  . Feeling of Stress : Not at all  Social Connections: Moderately Integrated  . Frequency of Communication with Friends and Family: More than three times a week  . Frequency of Social Gatherings with Friends and Family: More than three times a week    . Attends Religious Services: More than 4 times per year  . Active Member of Clubs or Organizations: Yes  . Attends Archivist Meetings: More than 4 times per year  . Marital Status: Widowed    Tobacco Counseling Counseling given: Not Answered Comment: smoked a pack a week, quit about 30 years ago   Clinical Intake:  Pre-visit preparation completed: Yes  Pain : No/denies pain     Nutritional Risks: None Diabetes: No  How often do you need to have someone help you when you read instructions, pamphlets, or other written materials from your doctor or pharmacy?: 1 - Never  Diabetic? No  Interpreter Needed?: No  Information entered by :: [facility], LPN   Activities of Daily Living In your present state of health, do you have any difficulty performing the following activities: 08/11/2020  Hearing? N  Vision? N  Difficulty concentrating or making decisions?  N  Walking or climbing stairs? Y  Comment Due to SOB.  Dressing or bathing? N  Doing errands, shopping? N  Preparing Food and eating ? N  Using the Toilet? N  In the past six months, have you accidently leaked urine? N  Do you have problems with loss of bowel control? N  Managing your Medications? N  Managing your Finances? N  Housekeeping or managing your Housekeeping? N  Some recent data might be hidden    Patient Care Team: Virginia Crews, MD as PCP - General (Family Medicine) Bary Castilla, Forest Gleason, MD (General Surgery) Allyne Gee, MD as Consulting Physician (Internal Medicine) [place], [place] [facility])  Indicate any recent Medical Services you may have received from other than [facility] providers in the past year (date may be approximate).     Assessment:   This is a routine wellness examination for [place].  Hearing/Vision screen No exam data present  Dietary issues and exercise activities discussed: Current Exercise Habits: Home exercise routine, Type of exercise: walking, Time  (Minutes): 30, Frequency (Times/Week): 5, Weekly Exercise (Minutes/Week): 150, Exercise limited by: None identified  Goals    . DIET - REDUCE SUGAR INTAKE     Recommend to monitor sugar intake and avoid sweets in daily diet.       Depression Screen PHQ 2/9 Scores 08/11/2020 08/10/2019 08/06/2018 05/01/2018 01/27/2018 11/04/2017 10/03/2017  PHQ - 2 Score 0 0 0 0 0 0 0    Fall Risk Fall Risk  08/11/2020 08/11/2019 08/10/2019 03/06/2019 08/06/2018  Falls in the past year? 0 0 0 0 0  Number falls in past yr: 0 0 0 - -  Injury with Fall? 0 0 0 - -  Follow up - Falls evaluation completed - - -    Any stairs in or around the home? Yes  If so, are there any without handrails? No  Home free of loose throw rugs in walkways, pet beds, electrical cords, etc? Yes  Adequate lighting in your home to reduce risk of falls? Yes   ASSISTIVE DEVICES UTILIZED TO PREVENT FALLS:  Life alert? No  Use of a cane, walker or w/c? No  Grab bars in the bathroom? Yes  Shower chair or bench in shower? No  Elevated toilet seat or a handicapped toilet? Yes    Cognitive Function: Declined today.     6CIT Screen 08/06/2018  What Year? 0 points  What month? 0 points  What time? 0 points  Count back from 20 0 points  Months in reverse 0 points  Repeat phrase 0 points  Total Score 0    Immunizations Immunization History  Administered Date(s) Administered  . Influenza Inj Mdck Quad Pf 05/29/2019  . Influenza, High Dose Seasonal PF 07/23/2017, 07/21/2018, 07/12/2020  . Influenza-Unspecified 08/01/2015  . Moderna SARS-COVID-2 Vaccination 10/13/2019, 11/10/2019  . Pneumococcal Conjugate-13 08/01/2015  . Pneumococcal Polysaccharide-23 07/26/2014  . Td 02/26/2008  . Zoster 03/17/2009  . Zoster Recombinat (Shingrix) 07/21/2018, 09/26/2018    TDAP status: Up to date Flu Vaccine status: Up to date Pneumococcal vaccine status: Up to date Covid-19 vaccine status: Completed vaccines  Qualifies for Shingles Vaccine?  Yes   Zostavax completed Yes   Shingrix Completed?: Yes  Screening Tests Health Maintenance  Topic Date Due  . TETANUS/TDAP  08/07/2023 (Originally 02/25/2018)  . COLONOSCOPY  03/22/2021  . DEXA SCAN  08/16/2021  . INFLUENZA VACCINE  Completed  . COVID-19 Vaccine  Completed  . PNA vac Low Risk Adult  Completed    Health Maintenance  There are no preventive care reminders to display for this patient.  Colorectal cancer screening: Completed 03/22/16. Repeat every 5 years Mammogram status: No longer required.  Bone Density status: Completed 08/17/19. Results reflect: Bone density results: OSTEOPOROSIS. Repeat every 2 years.  Lung Cancer Screening: (Low Dose CT Chest recommended if Age 71-80 years, 30 pack-year currently smoking OR have quit w/in 15years.) does not qualify.    Additional Screening:  Vision Screening: Recommended annual ophthalmology exams for early detection of glaucoma and other disorders of the eye. Is the patient up to date with their annual eye exam?  Yes  Who is the provider or what is the name of the office in which the patient attends annual eye exams? [facility] If pt is not established with a provider, would they like to be referred to a provider to establish care? No .   Dental Screening: Recommended annual dental exams for proper oral hygiene  Community Resource Referral / Chronic Care Management: CRR required this visit?  No   CCM required this visit?  No      Plan:     I have personally reviewed and noted the following in the patient's chart:   . Medical and social history . Use of alcohol, tobacco or illicit drugs  . Current medications and supplements . Functional ability and status . Nutritional status . Physical activity . Advanced directives . List of other physicians . Hospitalizations, surgeries, and ER visits in previous 12 months . Vitals . Screenings to include cognitive, depression, and falls . Referrals and  appointments  In addition, I have reviewed and discussed with patient certain preventive protocols, quality metrics, and best practice recommendations. A written personalized care plan for preventive services as well as general preventive health recommendations were provided to patient.     Ajay Strubel [place], [place]   01/75/1025   Nurse Notes: None.

## 2020-08-11 ENCOUNTER — Ambulatory Visit (INDEPENDENT_AMBULATORY_CARE_PROVIDER_SITE_OTHER): Payer: Medicare Other | Admitting: Family Medicine

## 2020-08-11 ENCOUNTER — Other Ambulatory Visit: Payer: Self-pay

## 2020-08-11 ENCOUNTER — Ambulatory Visit (INDEPENDENT_AMBULATORY_CARE_PROVIDER_SITE_OTHER): Payer: Medicare Other

## 2020-08-11 ENCOUNTER — Encounter: Payer: Self-pay | Admitting: Family Medicine

## 2020-08-11 VITALS — BP 125/82 | HR 62 | Temp 98.6°F | Resp 16 | Ht 61.0 in | Wt 133.4 lb

## 2020-08-11 DIAGNOSIS — I10 Essential (primary) hypertension: Secondary | ICD-10-CM | POA: Diagnosis not present

## 2020-08-11 DIAGNOSIS — E782 Mixed hyperlipidemia: Secondary | ICD-10-CM

## 2020-08-11 DIAGNOSIS — Z0000 Encounter for general adult medical examination without abnormal findings: Secondary | ICD-10-CM | POA: Diagnosis not present

## 2020-08-11 DIAGNOSIS — J8410 Pulmonary fibrosis, unspecified: Secondary | ICD-10-CM | POA: Diagnosis not present

## 2020-08-11 NOTE — Patient Instructions (Signed)
Karen Henson , Thank you for taking time to come for your Medicare Wellness Visit. I appreciate your ongoing commitment to your health goals. Please review the following plan we discussed and let me know if I can assist you in the future.   Screening recommendations/referrals: Colonoscopy: Up to date, due 03/2021 Mammogram: No longer required.  Bone Density: Up to date, due 08/2021 Recommended yearly ophthalmology/optometry visit for glaucoma screening and checkup Recommended yearly dental visit for hygiene and checkup  Vaccinations: Influenza vaccine: Done 07/12/20 Pneumococcal vaccine: Completed series Tdap vaccine: Currently due, declined receiving. Shingles vaccine: Completed series    Advanced directives: Please bring a copy of your POA (Power of Attorney) and/or Living Will to your next appointment.   Conditions/risks identified: Recommend to continue to monitor sugar intake and cut back on sweets.  Next appointment: 2:40 PM today with Dr Brita Romp   Preventive Care 23 Years and Older, Female Preventive care refers to lifestyle choices and visits with your health care provider that can promote health and wellness. What does preventive care include?  A yearly physical exam. This is also called an annual well check.  Dental exams once or twice a year.  Routine eye exams. Ask your health care provider how often you should have your eyes checked.  Personal lifestyle choices, including:  Daily care of your teeth and gums.  Regular physical activity.  Eating a healthy diet.  Avoiding tobacco and drug use.  Limiting alcohol use.  Practicing safe sex.  Taking low-dose aspirin every day.  Taking vitamin and mineral supplements as recommended by your health care provider. What happens during an annual well check? The services and screenings done by your health care provider during your annual well check will depend on your age, overall health, lifestyle risk factors, and  family history of disease. Counseling  Your health care provider may ask you questions about your:  Alcohol use.  Tobacco use.  Drug use.  Emotional well-being.  Home and relationship well-being.  Sexual activity.  Eating habits.  History of falls.  Memory and ability to understand (cognition).  Work and work Statistician.  Reproductive health. Screening  You may have the following tests or measurements:  Height, weight, and BMI.  Blood pressure.  Lipid and cholesterol levels. These may be checked every 5 years, or more frequently if you are over 75 years old.  Skin check.  Lung cancer screening. You may have this screening every year starting at age 8 if you have a 30-pack-year history of smoking and currently smoke or have quit within the past 15 years.  Fecal occult blood test (FOBT) of the stool. You may have this test every year starting at age 2.  Flexible sigmoidoscopy or colonoscopy. You may have a sigmoidoscopy every 5 years or a colonoscopy every 10 years starting at age 56.  Hepatitis C blood test.  Hepatitis B blood test.  Sexually transmitted disease (STD) testing.  Diabetes screening. This is done by checking your blood sugar (glucose) after you have not eaten for a while (fasting). You may have this done every 1-3 years.  Bone density scan. This is done to screen for osteoporosis. You may have this done starting at age 49.  Mammogram. This may be done every 1-2 years. Talk to your health care provider about how often you should have regular mammograms. Talk with your health care provider about your test results, treatment options, and if necessary, the need for more tests. Vaccines  Your health care  provider may recommend certain vaccines, such as:  Influenza vaccine. This is recommended every year.  Tetanus, diphtheria, and acellular pertussis (Tdap, Td) vaccine. You may need a Td booster every 10 years.  Zoster vaccine. You may need this  after age 82.  Pneumococcal 13-valent conjugate (PCV13) vaccine. One dose is recommended after age 3.  Pneumococcal polysaccharide (PPSV23) vaccine. One dose is recommended after age 51. Talk to your health care provider about which screenings and vaccines you need and how often you need them. This information is not intended to replace advice given to you by your health care provider. Make sure you discuss any questions you have with your health care provider. Document Released: 10/14/2015 Document Revised: 06/06/2016 Document Reviewed: 07/19/2015 Elsevier Interactive Patient Education  2017 [employer] Prevention in the Home Falls can cause injuries. They can happen to people of all ages. There are many things you can do to make your home safe and to help prevent falls. What can I do on the outside of my home?  Regularly fix the edges of walkways and driveways and fix any cracks.  Remove anything that might make you trip as you walk through a door, such as a raised step or threshold.  Trim any bushes or trees on the path to your home.  Use bright outdoor lighting.  Clear any walking paths of anything that might make someone trip, such as rocks or tools.  Regularly check to see if handrails are loose or broken. Make sure that both sides of any steps have handrails.  Any raised decks and porches should have guardrails on the edges.  Have any leaves, snow, or ice cleared regularly.  Use sand or salt on walking paths during winter.  Clean up any spills in your garage right away. This includes oil or grease spills. What can I do in the bathroom?  Use night lights.  Install grab bars by the toilet and in the tub and shower. Do not use towel bars as grab bars.  Use non-skid mats or decals in the tub or shower.  If you need to sit down in the shower, use a plastic, non-slip stool.  Keep the floor dry. Clean up any water that spills on the floor as soon as it  happens.  Remove soap buildup in the tub or shower regularly.  Attach bath mats securely with double-sided non-slip rug tape.  Do not have throw rugs and other things on the floor that can make you trip. What can I do in the bedroom?  Use night lights.  Make sure that you have a light by your bed that is easy to reach.  Do not use any sheets or blankets that are too big for your bed. They should not hang down onto the floor.  Have a firm chair that has side arms. You can use this for support while you get dressed.  Do not have throw rugs and other things on the floor that can make you trip. What can I do in the kitchen?  Clean up any spills right away.  Avoid walking on wet floors.  Keep items that you use a lot in easy-to-reach places.  If you need to reach something above you, use a strong step stool that has a grab bar.  Keep electrical cords out of the way.  Do not use floor polish or wax that makes floors slippery. If you must use wax, use non-skid floor wax.  Do not have throw  rugs and other things on the floor that can make you trip. What can I do with my stairs?  Do not leave any items on the stairs.  Make sure that there are handrails on both sides of the stairs and use them. Fix handrails that are broken or loose. Make sure that handrails are as long as the stairways.  Check any carpeting to make sure that it is firmly attached to the stairs. Fix any carpet that is loose or worn.  Avoid having throw rugs at the top or bottom of the stairs. If you do have throw rugs, attach them to the floor with carpet tape.  Make sure that you have a light switch at the top of the stairs and the bottom of the stairs. If you do not have them, ask someone to add them for you. What else can I do to help prevent falls?  Wear shoes that:  Do not have high heels.  Have rubber bottoms.  Are comfortable and fit you well.  Are closed at the toe. Do not wear sandals.  If you  use a stepladder:  Make sure that it is fully opened. Do not climb a closed stepladder.  Make sure that both sides of the stepladder are locked into place.  Ask someone to hold it for you, if possible.  Clearly mark and make sure that you can see:  Any grab bars or handrails.  First and last steps.  Where the edge of each step is.  Use tools that help you move around (mobility aids) if they are needed. These include:  Canes.  Walkers.  Scooters.  Crutches.  Turn on the lights when you go into a dark area. Replace any light bulbs as soon as they burn out.  Set up your furniture so you have a clear path. Avoid moving your furniture around.  If any of your floors are uneven, fix them.  If there are any pets around you, be aware of where they are.  Review your medicines with your doctor. Some medicines can make you feel dizzy. This can increase your chance of falling. Ask your doctor what other things that you can do to help prevent falls. This information is not intended to replace advice given to you by your health care provider. Make sure you discuss any questions you have with your health care provider. Document Released: 07/14/2009 Document Revised: 02/23/2016 Document Reviewed: 10/22/2014 Elsevier Interactive Patient Education  2017 [employer].

## 2020-08-11 NOTE — Assessment & Plan Note (Addendum)
Endorses some SOB but unchanged from baseline Continue Symbicort, Claritin and Singulair

## 2020-08-11 NOTE — Progress Notes (Signed)
Established patient visit   Patient: Karen Henson   DOB: 05-Aug-1941   79 y.o. Female  MRN: [id number] Visit Date: 08/11/2020  Today's healthcare provider: Lavon Paganini, MD   Chief Complaint  Patient presents with   Hypertension   Subjective    HPI  Murielle Stang is doing well. She has no complaints. She occassionally gets short of breath when exerting herself but recovers quickly and has no associated chest pain. She describes her diet as good, she describes her exercise routine as good, she walks 1-2 miles/ day. She lives close by her family and feels like she has a great social support system.   Social History   Tobacco Use   Smoking status: Former Smoker    Packs/day: 0.25    Types: Cigarettes   Smokeless tobacco: Never Used   Tobacco comment: smoked a pack a week, quit about 30 years ago  Vaping Use   Vaping Use: Never used  Substance Use Topics   Alcohol use: Yes    Comment: ocassional- monthly   Drug use: No       Medications: Outpatient Medications Prior to Visit  Medication Sig   acetaminophen (TYLENOL) 500 MG tablet Take 500 mg by mouth every 6 (six) hours as needed for moderate pain or headache.   albuterol (VENTOLIN HFA) 108 (90 Base) MCG/ACT inhaler Inhale 2 puffs into the lungs every 6 (six) hours as needed for wheezing or shortness of breath.   dextromethorphan-guaiFENesin (MUCINEX DM) 30-600 MG 12hr tablet Take 1 tablet by mouth daily.   loratadine (CLARITIN) 10 MG tablet Take 10 mg by mouth daily.    losartan-hydrochlorothiazide (HYZAAR) 50-12.5 MG tablet TAKE 1 TABLET BY MOUTH ONCE DAILY   montelukast (SINGULAIR) 10 MG tablet TAKE 1 TABLET BY MOUTH BEDTIME FOR ASTHMA   SYMBICORT 80-4.5 MCG/ACT inhaler INHALE 2 PUFFS INTO LUNGS TWICE DAILY. RINSE MOUTH AFTER USE   No facility-administered medications prior to visit.    Review of Systems  Constitutional: Negative.   HENT: Negative.   Eyes: Negative.   Respiratory: Positive  for shortness of breath.   Cardiovascular: Negative.   Gastrointestinal: Negative.   Endocrine: Negative.   Genitourinary: Negative.   Musculoskeletal: Negative.   Skin: Negative.   Allergic/Immunologic: Negative.   Neurological: Negative.   Hematological: Negative.   Psychiatric/Behavioral: Negative.        Objective    BP 125/82 (BP Location: Left Arm, Patient Position: Sitting, Cuff Size: Normal)    Pulse 62    Temp 98.6 F (37 C) (Oral)    Resp 16    Ht 5\' 1"  (1.549 m)    Wt 133 lb 6.4 oz (60.5 kg)    BMI 25.21 kg/m    Physical Exam Vitals reviewed.  Constitutional:      General: She is not in acute distress.    Appearance: Normal appearance. She is well-developed. She is not diaphoretic.  HENT:     Head: Normocephalic and atraumatic.  Eyes:     General: No scleral icterus.    Conjunctiva/sclera: Conjunctivae normal.  Neck:     Thyroid: No thyromegaly.  Cardiovascular:     Rate and Rhythm: Normal rate and regular rhythm.     Pulses: Normal pulses.     Heart sounds: Normal heart sounds. No murmur heard.   Pulmonary:     Effort: Pulmonary effort is normal. No respiratory distress.     Breath sounds: Normal breath sounds. No wheezing or rales.  Abdominal:  General: There is no distension.     Palpations: Abdomen is soft.     Tenderness: There is no abdominal tenderness.  Musculoskeletal:        General: No deformity.     Cervical back: Neck supple.     Right lower leg: No edema.     Left lower leg: No edema.  Lymphadenopathy:     Cervical: No cervical adenopathy.  Skin:    General: Skin is warm and dry.     Findings: No rash.  Neurological:     Mental Status: She is alert and oriented to person, place, and time. Mental status is at baseline.     Sensory: No sensory deficit.     Motor: No weakness.     Gait: Gait normal.  Psychiatric:        Mood and Affect: Mood normal.        Behavior: Behavior normal.        Thought Content: Thought content normal.      General: Well appearing, pleasant on approach Lungs: Mild decrease breath sounds Cards: RRR, no peripheral edema   No results found for any visits on 08/11/20.  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     Note was initiated by Stark Klein, MS3   Patient seen along with MS3 student [facility] I personally evaluated this patient along with the student, and verified all aspects of the history, physical exam, and medical decision making as documented by the student. I agree with the student's documentation and have made all necessary edits.  Marijose Curington, [name], MD, MPH [facility]

## 2020-08-11 NOTE — Patient Instructions (Signed)
Preventive Care 38 Years and Older, Female Preventive care refers to lifestyle choices and visits with your health care provider that can promote health and wellness. This includes:  A yearly physical exam. This is also called an annual well check.  Regular dental and eye exams.  Immunizations.  Screening for certain conditions.  Healthy lifestyle choices, such as diet and exercise. What can I expect for my preventive care visit? Physical exam Your health care provider will check:  Height and weight. These may be used to calculate body mass index (BMI), which is a measurement that tells if you are at a healthy weight.  Heart rate and blood pressure.  Your skin for abnormal spots. Counseling Your health care provider may ask you questions about:  Alcohol, tobacco, and drug use.  Emotional well-being.  Home and relationship well-being.  Sexual activity.  Eating habits.  History of falls.  Memory and ability to understand (cognition).  Work and work Statistician.  Pregnancy and menstrual history. What immunizations do I need?  Influenza (flu) vaccine  This is recommended every year. Tetanus, diphtheria, and pertussis (Tdap) vaccine  You may need a Td booster every 10 years. Varicella (chickenpox) vaccine  You may need this vaccine if you have not already been vaccinated. Zoster (shingles) vaccine  You may need this after age 33. Pneumococcal conjugate (PCV13) vaccine  One dose is recommended after age 33. Pneumococcal polysaccharide (PPSV23) vaccine  One dose is recommended after age 72. Measles, mumps, and rubella (MMR) vaccine  You may need at least one dose of MMR if you were born in 1957 or later. You may also need a second dose. Meningococcal conjugate (MenACWY) vaccine  You may need this if you have certain conditions. Hepatitis A vaccine  You may need this if you have certain conditions or if you travel or work in places where you may be exposed  to hepatitis A. Hepatitis B vaccine  You may need this if you have certain conditions or if you travel or work in places where you may be exposed to hepatitis B. Haemophilus influenzae type b (Hib) vaccine  You may need this if you have certain conditions. You may receive vaccines as individual doses or as more than one vaccine together in one shot (combination vaccines). Talk with your health care provider about the risks and benefits of combination vaccines. What tests do I need? Blood tests  Lipid and cholesterol levels. These may be checked every 5 years, or more frequently depending on your overall health.  Hepatitis C test.  Hepatitis B test. Screening  Lung cancer screening. You may have this screening every year starting at age 39 if you have a 30-pack-year history of smoking and currently smoke or have quit within the past 15 years.  Colorectal cancer screening. All adults should have this screening starting at age 36 and continuing until age 15. Your health care provider may recommend screening at age 23 if you are at increased risk. You will have tests every 1-10 years, depending on your results and the type of screening test.  Diabetes screening. This is done by checking your blood sugar (glucose) after you have not eaten for a while (fasting). You may have this done every 1-3 years.  Mammogram. This may be done every 1-2 years. Talk with your health care provider about how often you should have regular mammograms.  BRCA-related cancer screening. This may be done if you have a family history of breast, ovarian, tubal, or peritoneal cancers.  Other tests  Sexually transmitted disease (STD) testing.  Bone density scan. This is done to screen for osteoporosis. You may have this done starting at age 44. Follow these instructions at home: Eating and drinking  Eat a diet that includes fresh fruits and vegetables, whole grains, lean protein, and low-fat dairy products. Limit  your intake of foods with high amounts of sugar, saturated fats, and salt.  Take vitamin and mineral supplements as recommended by your health care provider.  Do not drink alcohol if your health care provider tells you not to drink.  If you drink alcohol: ? Limit how much you have to 0-1 drink a day. ? Be aware of how much alcohol is in your drink. In the [place] one drink equals one 12 oz bottle of beer (355 mL), one 5 oz glass of wine (148 mL), or one 1 oz glass of hard liquor (44 mL). Lifestyle  Take daily care of your teeth and gums.  Stay active. Exercise for at least 30 minutes on 5 or more days each week.  Do not use any products that contain nicotine or tobacco, such as cigarettes, e-cigarettes, and chewing tobacco. If you need help quitting, ask your health care provider.  If you are sexually active, practice safe sex. Use a condom or other form of protection in order to prevent STIs (sexually transmitted infections).  Talk with your health care provider about taking a low-dose aspirin or statin. What's next?  Go to your health care provider once a year for a well check visit.  Ask your health care provider how often you should have your eyes and teeth checked.  Stay up to date on all vaccines. This information is not intended to replace advice given to you by your health care provider. Make sure you discuss any questions you have with your health care provider. Document Revised: 09/11/2018 Document Reviewed: 09/11/2018 Elsevier Patient Education  2020 [employer].

## 2020-08-11 NOTE — Assessment & Plan Note (Addendum)
BP today 125/83 Well controlled on HCTZ-Losartan, continue at current dose Check CMP today F/U 6 months

## 2020-08-12 ENCOUNTER — Other Ambulatory Visit: Payer: Self-pay

## 2020-08-12 ENCOUNTER — Ambulatory Visit
Admission: RE | Admit: 2020-08-12 | Discharge: 2020-08-12 | Disposition: A | Discharge status: Home / Self Care | Payer: Medicare Other | Source: Ambulatory Visit | Attending: Family Medicine | Admitting: Family Medicine

## 2020-08-12 DIAGNOSIS — Z1231 Encounter for screening mammogram for malignant neoplasm of breast: Secondary | ICD-10-CM | POA: Insufficient documentation

## 2020-08-12 DIAGNOSIS — E782 Mixed hyperlipidemia: Secondary | ICD-10-CM | POA: Diagnosis not present

## 2020-08-12 DIAGNOSIS — I10 Essential (primary) hypertension: Secondary | ICD-10-CM | POA: Diagnosis not present

## 2020-08-13 LAB — COMPREHENSIVE METABOLIC PANEL
ALT: 7 IU/L (ref 0–32)
AST: 14 IU/L (ref 0–40)
Albumin/Globulin Ratio: 2.2 (ref 1.2–2.2)
Albumin: 4.7 g/dL (ref 3.7–4.7)
Alkaline Phosphatase: 30 IU/L — ABNORMAL LOW (ref 44–121)
BUN/Creatinine Ratio: 15 (ref 12–28)
BUN: 12 mg/dL (ref 8–27)
Bilirubin Total: 0.9 mg/dL (ref 0.0–1.2)
CO2: 23 mmol/L (ref 20–29)
Calcium: 9.8 mg/dL (ref 8.7–10.3)
Chloride: 99 mmol/L (ref 96–106)
Creatinine, Ser: 0.81 mg/dL (ref 0.57–1.00)
GFR calc Af Amer: 80 mL/min/{1.73_m2} (ref >59)
GFR calc non Af Amer: 70 mL/min/{1.73_m2} (ref >59)
Globulin, Total: 2.1 g/dL (ref 1.5–4.5)
Glucose: 99 mg/dL (ref 65–99)
Potassium: 4.9 mmol/L (ref 3.5–5.2)
Sodium: 136 mmol/L (ref 134–144)
Total Protein: 6.8 g/dL (ref 6.0–8.5)

## 2020-08-13 LAB — LIPID PANEL
Chol/HDL Ratio: 3.6 ratio (ref 0.0–4.4)
Cholesterol, Total: 196 mg/dL (ref 100–199)
HDL: 55 mg/dL (ref >39)
LDL Chol Calc (NIH): 123 mg/dL — ABNORMAL HIGH (ref 0–99)
Triglycerides: 101 mg/dL (ref 0–149)
VLDL Cholesterol Cal: 18 mg/dL (ref 5–40)

## 2020-08-17 DIAGNOSIS — Z23 Encounter for immunization: Secondary | ICD-10-CM | POA: Diagnosis not present

## 2020-08-23 ENCOUNTER — Other Ambulatory Visit: Payer: Self-pay

## 2020-08-23 ENCOUNTER — Ambulatory Visit (INDEPENDENT_AMBULATORY_CARE_PROVIDER_SITE_OTHER): Payer: Medicare Other | Admitting: Adult Health

## 2020-08-23 ENCOUNTER — Encounter: Payer: Self-pay | Admitting: Adult Health

## 2020-08-23 VITALS — BP 140/86 | HR 73 | Temp 97.8°F | Resp 16 | Wt 134.2 lb

## 2020-08-23 DIAGNOSIS — N390 Urinary tract infection, site not specified: Secondary | ICD-10-CM | POA: Diagnosis not present

## 2020-08-23 DIAGNOSIS — N3001 Acute cystitis with hematuria: Secondary | ICD-10-CM | POA: Diagnosis not present

## 2020-08-23 DIAGNOSIS — R300 Dysuria: Secondary | ICD-10-CM | POA: Diagnosis not present

## 2020-08-23 LAB — POCT URINALYSIS DIPSTICK
Bilirubin, UA: NEGATIVE
Blood, UA: POSITIVE
Glucose, UA: NEGATIVE
Ketones, UA: NEGATIVE
Nitrite, UA: POSITIVE
Spec Grav, UA: 1.01 (ref 1.010–1.025)
Urobilinogen, UA: NEGATIVE E.U./dL — AB
pH, UA: 8 (ref 5.0–8.0)

## 2020-08-23 MED ORDER — CEPHALEXIN 500 MG PO CAPS: 500.0000 mg | ORAL_CAPSULE | Freq: Three times a day (TID) | ORAL | 0 refills | Status: AC

## 2020-08-23 MED ORDER — CEPHALEXIN 500 MG PO CAPS: 500.0000 mg | ORAL_CAPSULE | Freq: Three times a day (TID) | ORAL | 0 refills | Status: DC

## 2020-08-23 NOTE — Patient Instructions (Signed)
Cephalexin Tablets or Capsules What is this medicine? CEPHALEXIN (sef a LEX in) is a cephalosporin antibiotic. It treats some infections caused by bacteria. It will not work for colds, the flu, or other viruses. This medicine may be used for other purposes; ask your health care provider or pharmacist if you have questions. COMMON BRAND NAME(S): Biocef, Daxbia, Keflex, Keftab What should I tell my health care provider before I take this medicine? They need to know if you have any of these conditions:  kidney disease  stomach or intestine problems, especially colitis  an unusual or allergic reaction to cephalexin, other cephalosporins, penicillins, other antibiotics, medicines, foods, dyes or preservatives  pregnant or trying to get pregnant  breast-feeding How should I use this medicine? Take this drug by mouth. Take it as directed on the prescription label at the same time every day. You can take it with or without food. If it upsets your stomach, take it with food. Take all of this drug unless your health care provider tells you to stop it early. Keep taking it even if you think you are better. Talk to your health care provider about the use of this drug in children. While it may be prescribed for selected conditions, precautions do apply. Overdosage: If you think you have taken too much of this medicine contact a poison control center or emergency room at once. NOTE: This medicine is only for you. Do not share this medicine with others. What if I miss a dose? If you miss a dose, take it as soon as you can. If it is almost time for your next dose, take only that dose. Do not take double or extra doses. What may interact with this medicine?  probenecid  some other antibiotics This list may not describe all possible interactions. Give your health care provider a list of all the medicines, herbs, non-prescription drugs, or dietary supplements you use. Also tell them if you smoke, drink  alcohol, or use illegal drugs. Some items may interact with your medicine. What should I watch for while using this medicine? Tell your doctor or health care provider if your symptoms do not begin to improve in a few days. This medicine may cause serious skin reactions. They can happen weeks to months after starting the medicine. Contact your health care provider right away if you notice fevers or flu-like symptoms with a rash. The rash may be red or purple and then turn into blisters or peeling of the skin. Or, you might notice a red rash with swelling of the face, lips or lymph nodes in your neck or under your arms. Do not treat diarrhea with over the counter products. Contact your doctor if you have diarrhea that lasts more than 2 days or if it is severe and watery. If you have diabetes, you may get a false-positive result for sugar in your urine. Check with your doctor or health care provider. What side effects may I notice from receiving this medicine? Side effects that you should report to your doctor or health care professional as soon as possible:  allergic reactions like skin rash, itching or hives, swelling of the face, lips, or tongue  breathing problems  pain or trouble passing urine  redness, blistering, peeling or loosening of the skin, including inside the mouth  severe or watery diarrhea  unusually weak or tired  yellowing of the eyes, skin Side effects that usually do not require medical attention (report to your doctor or health care professional   if they continue or are bothersome):  gas or heartburn  genital or anal irritation  headache  joint or muscle pain  nausea, vomiting This list may not describe all possible side effects. Call your doctor for medical advice about side effects. You may report side effects to FDA at 1-800-FDA-1088. Where should I keep my medicine? Keep out of the reach of children and pets. Store at room temperature between 20 and 25  degrees C (68 and 77 degrees F). Throw away any unused drug after the expiration date. NOTE: This sheet is a summary. It may not cover all possible information. If you have questions about this medicine, talk to your doctor, pharmacist, or health care provider.  2020 Elsevier/Gold Standard (2019-04-24 11:27:00) Urinary Tract Infection, Adult A urinary tract infection (UTI) is an infection of any part of the urinary tract. The urinary tract includes:  The kidneys.  The ureters.  The bladder.  The urethra. These organs make, store, and get rid of pee (urine) in the body. What are the causes? This is caused by germs (bacteria) in your genital area. These germs grow and cause swelling (inflammation) of your urinary tract. What increases the risk? You are more likely to develop this condition if:  You have a small, thin tube (catheter) to drain pee.  You cannot control when you pee or poop (incontinence).  You are female, and: ? You use these methods to prevent pregnancy:  A medicine that kills sperm (spermicide).  A device that blocks sperm (diaphragm). ? You have low levels of a female hormone (estrogen). ? You are pregnant.  You have genes that add to your risk.  You are sexually active.  You take antibiotic medicines.  You have trouble peeing because of: ? A prostate that is bigger than normal, if you are female. ? A blockage in the part of your body that drains pee from the bladder (urethra). ? A kidney stone. ? A nerve condition that affects your bladder (neurogenic bladder). ? Not getting enough to drink. ? Not peeing often enough.  You have other conditions, such as: ? Diabetes. ? A weak disease-fighting system (immune system). ? Sickle cell disease. ? Gout. ? Injury of the spine. What are the signs or symptoms? Symptoms of this condition include:  Needing to pee right away (urgently).  Peeing often.  Peeing small amounts often.  Pain or burning when  peeing.  Blood in the pee.  Pee that smells bad or not like normal.  Trouble peeing.  Pee that is cloudy.  Fluid coming from the vagina, if you are female.  Pain in the belly or lower back. Other symptoms include:  Throwing up (vomiting).  No urge to eat.  Feeling mixed up (confused).  Being tired and grouchy (irritable).  A fever.  Watery poop (diarrhea). How is this treated? This condition may be treated with:  Antibiotic medicine.  Other medicines.  Drinking enough water. Follow these instructions at home:  Medicines  Take over-the-counter and prescription medicines only as told by your doctor.  If you were prescribed an antibiotic medicine, take it as told by your doctor. Do not stop taking it even if you start to feel better. General instructions  Make sure you: ? Pee until your bladder is empty. ? Do not hold pee for a long time. ? Empty your bladder after sex. ? Wipe from front to back after pooping if you are a female. Use each tissue one time when you wipe.    Drink enough fluid to keep your pee pale yellow.  Keep all follow-up visits as told by your doctor. This is important. Contact a doctor if:  You do not get better after 1-2 days.  Your symptoms go away and then come back. Get help right away if:  You have very bad back pain.  You have very bad pain in your lower belly.  You have a fever.  You are sick to your stomach (nauseous).  You are throwing up. Summary  A urinary tract infection (UTI) is an infection of any part of the urinary tract.  This condition is caused by germs in your genital area.  There are many risk factors for a UTI. These include having a small, thin tube to drain pee and not being able to control when you pee or poop.  Treatment includes antibiotic medicines for germs.  Drink enough fluid to keep your pee pale yellow. This information is not intended to replace advice given to you by your health care  provider. Make sure you discuss any questions you have with your health care provider. Document Revised: 09/04/2018 Document Reviewed: 03/27/2018 Elsevier Patient Education  2020 Elsevier Inc.  

## 2020-08-23 NOTE — Progress Notes (Signed)
Established patient visit   Patient: Karen Henson   DOB: 1940/12/19   79 y.o. Female  MRN: [id number] Visit Date: 08/23/2020  Today's healthcare provider: Marcille Buffy, FNP   Chief Complaint  Patient presents with  . Urinary Tract Infection   Subjective    HPI  Urinary symptoms  She reports new onset dysuria, hematuria, urinary frequency and urinary urgency. The current episode started this morning and is staying constant. Patient states symptoms are severe in intensity, occurring constantly. She  has not been recently treated for similar symptoms.   She noticed pink tinge on tissue this morning after urinating. No gross hematuria.   Denies flank pain/ back pain.   Last UTI in 11/2019 was e- coli over 100,000 colonies.    Patient  denies any fever, body aches,chills, rash, chest pain, shortness of breath, nausea, vomiting, or diarrhea.  Denies dizziness, lightheadedness, pre syncopal or syncopal episodes.    Associated symptoms: No abdominal pain No back pain  No chills No constipation  No cramping No diarrhea  No discharge No fever  YES hematuria No nausea  No vomiting    ---------------------------------------------------------------------------------------  Patient Active Problem List   Diagnosis Date Noted  . Acute cystitis with hematuria 08/23/2020  . Dysuria 08/23/2020  . Chronic cough 08/05/2017  . 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 History of adenomatous polyp of colon 05/30/2016  . Allergic rhinitis 11/09/2015  . 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 BP (high blood pressure) 11/09/2015  . 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 OP (osteoporosis) 11/09/2015   Social History   Tobacco Use  . Smoking status: Former Smoker    Packs/day: 0.25    Types: Cigarettes  . Smokeless tobacco: Never Used  . Tobacco comment: smoked a pack a week, quit about 30 years ago  Vaping Use    . Vaping Use: Never used  Substance Use Topics  . Alcohol use: Yes    Comment: ocassional- monthly  . Drug use: No   Allergies  Allergen Reactions  . Lisinopril Cough       Medications: Outpatient Medications Prior to Visit  Medication Sig  . acetaminophen (TYLENOL) 500 MG tablet Take 500 mg by mouth every 6 (six) hours as needed for moderate pain or headache.  . albuterol (VENTOLIN HFA) 108 (90 Base) MCG/ACT inhaler Inhale 2 puffs into the lungs every 6 (six) hours as needed for wheezing or shortness of breath.  . dextromethorphan-guaiFENesin (MUCINEX DM) 30-600 MG 12hr tablet Take 1 tablet by mouth daily.  Marland Kitchen loratadine (CLARITIN) 10 MG tablet Take 10 mg by mouth daily.   Marland Kitchen losartan-hydrochlorothiazide (HYZAAR) 50-12.5 MG tablet TAKE 1 TABLET BY MOUTH ONCE DAILY  . montelukast (SINGULAIR) 10 MG tablet TAKE 1 TABLET BY MOUTH BEDTIME FOR ASTHMA  . SYMBICORT 80-4.5 MCG/ACT inhaler INHALE 2 PUFFS INTO LUNGS TWICE DAILY. RINSE MOUTH AFTER USE (Patient taking differently: as needed. )   No facility-administered medications prior to visit.    Review of Systems  Constitutional: Negative.  Negative for chills and fever.  Cardiovascular: Negative.  Negative for chest pain and palpitations.  Gastrointestinal: Negative for abdominal pain, constipation, diarrhea, nausea and vomiting.  Genitourinary: Positive for dysuria, frequency, hematuria and urgency. Negative for flank pain, vaginal bleeding and vaginal discharge.    Last CBC Lab Results  Component Value Date   WBC 7.0 08/11/2019   HGB 14.3 08/11/2019   HCT 41.9 08/11/2019  MCV 94 08/11/2019   MCH 32.0 08/11/2019   RDW 12.7 08/11/2019   PLT 290 27/11/5007   Last metabolic panel Lab Results  Component Value Date   GLUCOSE 99 08/12/2020   NA 136 08/12/2020   K 4.9 08/12/2020   CL 99 08/12/2020   CO2 23 08/12/2020   BUN 12 08/12/2020   CREATININE 0.81 08/12/2020   GFRNONAA 70 08/12/2020   GFRAA 80 08/12/2020   CALCIUM  9.8 08/12/2020   PROT 6.8 08/12/2020   ALBUMIN 4.7 08/12/2020   LABGLOB 2.1 08/12/2020   AGRATIO 2.2 08/12/2020   BILITOT 0.9 08/12/2020   ALKPHOS 30 (L) 08/12/2020   AST 14 08/12/2020   ALT 7 08/12/2020      Objective    BP (!) 142/100 (BP Location: Left Arm, Patient Position: Sitting, Cuff Size: Normal)   Pulse 73   Temp 97.8 F (36.6 C) (Oral)   Resp 16   Wt 134 lb 3.2 oz (60.9 kg)   SpO2 96%   BMI 25.36 kg/m  BP Readings from Last 3 Encounters:  08/23/20 (!) 142/100  08/11/20 125/82  05/24/20 128/82   Wt Readings from Last 3 Encounters:  08/23/20 134 lb 3.2 oz (60.9 kg)  08/11/20 133 lb 6.4 oz (60.5 kg)  05/24/20 132 lb 12.8 oz (60.2 kg)      Physical Exam Vitals reviewed.  Constitutional:      General: She is not in acute distress.    Appearance: Normal appearance. She is not ill-appearing, toxic-appearing or diaphoretic.  HENT:     Head: Normocephalic and atraumatic.     Right Ear: There is no impacted cerumen.     Left Ear: There is no impacted cerumen.     Nose: Nose normal.     Mouth/Throat:     Mouth: Mucous membranes are moist.     Pharynx: No oropharyngeal exudate or posterior oropharyngeal erythema.  Eyes:     General: No scleral icterus.       Right eye: No discharge.        Left eye: No discharge.     Extraocular Movements: Extraocular movements intact.     Conjunctiva/sclera: Conjunctivae normal.  Cardiovascular:     Rate and Rhythm: Normal rate and regular rhythm.     Pulses: Normal pulses.     Heart sounds: Normal heart sounds. No murmur heard.  No friction rub. No gallop.   Pulmonary:     Effort: Pulmonary effort is normal. No respiratory distress.     Breath sounds: Normal breath sounds. No stridor. No wheezing, rhonchi or rales.  Chest:     Chest wall: No tenderness.  Abdominal:     General: There is no distension.     Palpations: Abdomen is soft.     Tenderness: There is no abdominal tenderness. There is no right CVA tenderness  or rebound.  Musculoskeletal:        General: Normal range of motion.     Cervical back: Normal range of motion and neck supple. No rigidity.  Skin:    General: Skin is warm.  Neurological:     General: No focal deficit present.  Psychiatric:        Mood and Affect: Mood normal.        Behavior: Behavior normal.        Thought Content: Thought content normal.        Judgment: Judgment normal.       Results for orders placed or performed in  visit on 08/23/20  POCT urinalysis dipstick  Result Value Ref Range   Color, UA yellow    Clarity, UA dark    Glucose, UA Negative Negative   Bilirubin, UA Negative    Ketones, UA Negative    Spec Grav, UA 1.010 1.010 - 1.025   Blood, UA positive    pH, UA 8.0 5.0 - 8.0   Protein, UA     Urobilinogen, UA negative (A) 0.2 or 1.0 E.U./dL   Nitrite, UA Positive    Leukocytes, UA Large (3+) (A) Negative    Assessment & Plan     Acute cystitis with hematuria - Plan: POCT urinalysis dipstick, cephALEXin (KEFLEX) 500 MG capsule, Urine Culture  Dysuria  Meds ordered this encounter  Medications  . cephALEXin (KEFLEX) 500 MG capsule    Sig: Take 1 capsule (500 mg total) by mouth 3 (three) times daily for 5 days.    Dispense:  15 capsule    Refill:  0  Red Flags discussed. The patient was given clear instructions to go to ER or return to medical center if any red flags develop, symptoms do not improve, worsen or new problems develop. They verbalized understanding.   Return in about 1 week (around 08/30/2020), or if symptoms worsen or fail to improve, for at any time for any worsening symptoms, Go to Emergency room/ urgent care if worse.       Marcille Buffy, [place] [phone] (phone) [phone] (fax)  [place]

## 2020-08-23 NOTE — Addendum Note (Signed)
Addended by: Doreen Beam on: 08/23/2020 09:24 AM   Modules accepted: Orders

## 2020-08-23 NOTE — Progress Notes (Signed)
Please send for urine culture.

## 2020-08-27 LAB — URINE CULTURE

## 2020-08-29 ENCOUNTER — Other Ambulatory Visit: Payer: Self-pay | Admitting: Adult Health

## 2020-08-29 DIAGNOSIS — N3001 Acute cystitis with hematuria: Secondary | ICD-10-CM

## 2020-08-29 MED ORDER — CIPROFLOXACIN HCL 500 MG PO TABS: 500.0000 mg | ORAL_TABLET | Freq: Two times a day (BID) | ORAL | 0 refills | Status: DC

## 2020-08-29 NOTE — Progress Notes (Signed)
Discontinue keflex. Per urine culture.  Meds ordered this encounter  Medications  . ciprofloxacin (CIPRO) 500 MG tablet    Sig: Take 1 tablet (500 mg total) by mouth 2 (two) times daily.    Dispense:  10 tablet    Refill:  0

## 2020-08-29 NOTE — Progress Notes (Signed)
Urine culture- discontinue Keflex and start Cipro as directed per urine culture result. Return to office if any symptoms persist, change or worsen at anytime.  Meds ordered this encounter Medications  ciprofloxacin (CIPRO) 500 MG tablet   Sig: Take 1 tablet (500 mg total) by mouth 2 (two) times daily.   Dispense:  10 tablet   Refill:  0

## 2020-09-07 ENCOUNTER — Ambulatory Visit: Payer: Medicare Other

## 2020-11-02 ENCOUNTER — Other Ambulatory Visit: Payer: Self-pay | Admitting: Family Medicine

## 2020-11-17 ENCOUNTER — Other Ambulatory Visit: Payer: Self-pay

## 2020-11-17 DIAGNOSIS — J449 Chronic obstructive pulmonary disease, unspecified: Secondary | ICD-10-CM

## 2020-11-23 ENCOUNTER — Ambulatory Visit (INDEPENDENT_AMBULATORY_CARE_PROVIDER_SITE_OTHER): Payer: Medicare Other | Admitting: Internal Medicine

## 2020-11-23 ENCOUNTER — Other Ambulatory Visit: Payer: Self-pay

## 2020-11-23 DIAGNOSIS — R0602 Shortness of breath: Secondary | ICD-10-CM | POA: Diagnosis not present

## 2020-11-23 DIAGNOSIS — J449 Chronic obstructive pulmonary disease, unspecified: Secondary | ICD-10-CM

## 2020-11-23 LAB — PULMONARY FUNCTION TEST

## 2020-12-11 NOTE — Progress Notes (Signed)
[place], [id number]  DATE OF SERVICE: November 23, 2020  Complete Pulmonary Function Testing Interpretation:  FINDINGS:  The forced vital capacity is mildly decreased.  The FEV1 is 1.03 L which is 56% of predicted and is moderately decreased.  Postbronchodilator there is no significant change in the FEV1.  FEV1 FVC ratio is moderately decreased.  Total lung capacity is increased residual volume is increased residual volume total lung capacity ratio is increased FRC is increased.  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  There was no response to bronchodilators clinical improvement may still occur.  Allyne Gee, MD [facility] Pulmonary Critical Care Medicine Sleep Medicine

## 2021-01-16 ENCOUNTER — Ambulatory Visit (INDEPENDENT_AMBULATORY_CARE_PROVIDER_SITE_OTHER): Payer: Medicare Other | Admitting: Internal Medicine

## 2021-01-16 ENCOUNTER — Telehealth: Payer: Self-pay

## 2021-01-16 ENCOUNTER — Other Ambulatory Visit: Payer: Self-pay

## 2021-01-16 ENCOUNTER — Encounter: Payer: Self-pay | Admitting: Internal Medicine

## 2021-01-16 VITALS — BP 109/80 | HR 100 | Temp 97.7°F | Resp 16 | Ht 62.0 in | Wt 131.8 lb

## 2021-01-16 DIAGNOSIS — R059 Cough, unspecified: Secondary | ICD-10-CM

## 2021-01-16 LAB — POC SOFIA SARS ANTIGEN FIA: SARS Coronavirus 2 Ag: POSITIVE — AB

## 2021-01-16 MED ORDER — AZITHROMYCIN 250 MG PO TABS: 250.0000 mg | ORAL_TABLET | Freq: Every day | ORAL | 0 refills | Status: DC

## 2021-01-16 MED ORDER — PREDNISONE 10 MG PO TABS: ORAL_TABLET | ORAL | 0 refills | Status: DC

## 2021-01-16 NOTE — Telephone Encounter (Signed)
Pt advised that we send azithromycin for 10 days and prednisone also advised her take her claritin and used inhaler as needed and drink plenty of water and if symptoms worse go to ED

## 2021-01-19 ENCOUNTER — Encounter: Payer: Self-pay | Admitting: Internal Medicine

## 2021-01-19 NOTE — Patient Instructions (Signed)
COVID-19 Quarantine vs. Isolation QUARANTINE keeps someone who was in close contact with someone who has COVID-19 away from others. Quarantine if you have been in close contact with someone who has COVID-19, unless you have been fully vaccinated. If you are fully vaccinated  You do NOT need to quarantine unless they have symptoms  Get tested 3-5 days after your exposure, even if you don't have symptoms  Wear a mask indoors in public for 14 days following exposure or until your test result is negative If you are not fully vaccinated  Stay home for 14 days after your last contact with a person who has COVID-19  Watch for fever (100.4F), cough, shortness of breath, or other symptoms of COVID-19  If possible, stay away from people you live with, especially people who are at higher risk for getting very sick from COVID-19  Contact your local public health department for options in your area to possibly shorten your quarantine ISOLATION keeps someone who is sick or tested positive for COVID-19 without symptoms away from others, even in their own home. People who are in isolation should stay home and stay in a specific "sick room" or area and use a separate bathroom (if available). If you are sick and think or know you have COVID-19 Stay home until after  At least 10 days since symptoms first appeared and  At least 24 hours with no fever without the use of fever-reducing medications and  Symptoms have improved If you tested positive for COVID-19 but do not have symptoms  Stay home until after 10 days have passed since your positive viral test  If you develop symptoms after testing positive, follow the steps above for those who are sick cdc.gov/coronavirus 06/27/2020 This information is not intended to replace advice given to you by your health care provider. Make sure you discuss any questions you have with your health care provider. Document Revised: 08/01/2020 Document Reviewed:  08/01/2020 Elsevier Patient Education  2021 Elsevier Inc.  

## 2021-01-19 NOTE — Progress Notes (Signed)
Patient tested positive was referred back to the hospital for further evaluation she came in for symptoms of cough congestion and malaise.  Rapid sequence test was performed and she was found to be positive for COVID

## 2021-01-26 ENCOUNTER — Other Ambulatory Visit: Payer: Self-pay | Admitting: Internal Medicine

## 2021-01-26 DIAGNOSIS — Z03818 Encounter for observation for suspected exposure to other biological agents ruled out: Secondary | ICD-10-CM | POA: Diagnosis not present

## 2021-01-26 DIAGNOSIS — Z20822 Contact with and (suspected) exposure to covid-19: Secondary | ICD-10-CM | POA: Diagnosis not present

## 2021-01-30 DIAGNOSIS — Z20822 Contact with and (suspected) exposure to covid-19: Secondary | ICD-10-CM | POA: Diagnosis not present

## 2021-01-30 DIAGNOSIS — Z03818 Encounter for observation for suspected exposure to other biological agents ruled out: Secondary | ICD-10-CM | POA: Diagnosis not present

## 2021-02-07 ENCOUNTER — Telehealth: Payer: Self-pay

## 2021-02-07 NOTE — Telephone Encounter (Signed)
Copied from [place] [id number]. Topic: General - Other >> Feb 07, 2021 10:43 AM Karen Henson wrote: Reason for CRM: Patient called in to say that her daughter tested positive for Covid she have an upcoming appointment want to know can she do it by phnoe call please call with an answer from Dr B at Ph# [phone]

## 2021-02-07 NOTE — Telephone Encounter (Signed)
Pt advised.

## 2021-02-07 NOTE — Telephone Encounter (Signed)
Looks like her appointment on 5/12 is a 69-month follow-up.  We could try to do this virtually/phone if she would like, or we can reschedule.  Thank her for letting us know.  If she has a blood pressure cuff, please advise her to have a blood pressure reading for that virtual visit.

## 2021-02-09 ENCOUNTER — Encounter: Payer: Self-pay | Admitting: Family Medicine

## 2021-02-09 ENCOUNTER — Ambulatory Visit (INDEPENDENT_AMBULATORY_CARE_PROVIDER_SITE_OTHER): Payer: Medicare Other | Admitting: Family Medicine

## 2021-02-09 VITALS — BP 119/78

## 2021-02-09 DIAGNOSIS — I10 Essential (primary) hypertension: Secondary | ICD-10-CM | POA: Diagnosis not present

## 2021-02-09 NOTE — Assessment & Plan Note (Signed)
Well controlled Continue current medications Recheck metabolic panel F/u in 6 months  

## 2021-02-09 NOTE — Patient Instructions (Signed)
Managing Your Hypertension Hypertension, also called high blood pressure, is when the force of the blood pressing against the walls of the arteries is too strong. Arteries are blood vessels that carry blood from your heart throughout your body. Hypertension forces the heart to work harder to pump blood and may cause the arteries to become narrow or stiff. Understanding blood pressure readings Your personal target blood pressure may vary depending on your medical conditions, your age, and other factors. A blood pressure reading includes a higher number over a lower number. Ideally, your blood pressure should be below 120/80. You should know that:  The first, or top, number is called the systolic pressure. It is a measure of the pressure in your arteries as your heart beats.  The second, or bottom number, is called the diastolic pressure. It is a measure of the pressure in your arteries as the heart relaxes. Blood pressure is classified into four stages. Based on your blood pressure reading, your health care provider may use the following stages to determine what type of treatment you need, if any. Systolic pressure and diastolic pressure are measured in a unit called mmHg. Normal  Systolic pressure: below 120.  Diastolic pressure: below 80. Elevated  Systolic pressure: 120-129.  Diastolic pressure: below 80. Hypertension stage 1  Systolic pressure: 130-139.  Diastolic pressure: 80-89. Hypertension stage 2  Systolic pressure: 140 or above.  Diastolic pressure: 90 or above. How can this condition affect me? Managing your hypertension is an important responsibility. Over time, hypertension can damage the arteries and decrease blood flow to important parts of the body, including the brain, heart, and kidneys. Having untreated or uncontrolled hypertension can lead to:  A heart attack.  A stroke.  A weakened blood vessel (aneurysm).  Heart failure.  Kidney damage.  Eye  damage.  Metabolic syndrome.  Memory and concentration problems.  Vascular dementia. What actions can I take to manage this condition? Hypertension can be managed by making lifestyle changes and possibly by taking medicines. Your health care provider will help you make a plan to bring your blood pressure within a normal range. Nutrition  Eat a diet that is high in fiber and potassium, and low in salt (sodium), added sugar, and fat. An example eating plan is called the Dietary Approaches to Stop Hypertension (DASH) diet. To eat this way: ? Eat plenty of fresh fruits and vegetables. Try to fill one-half of your plate at each meal with fruits and vegetables. ? Eat whole grains, such as whole-wheat pasta, brown rice, or whole-grain bread. Fill about one-fourth of your plate with whole grains. ? Eat low-fat dairy products. ? Avoid fatty cuts of meat, processed or cured meats, and poultry with skin. Fill about one-fourth of your plate with lean proteins such as fish, chicken without skin, beans, eggs, and tofu. ? Avoid pre-made and processed foods. These tend to be higher in sodium, added sugar, and fat.  Reduce your daily sodium intake. Most people with hypertension should eat less than 1,500 mg of sodium a day.   Lifestyle  Work with your health care provider to maintain a healthy body weight or to lose weight. Ask what an ideal weight is for you.  Get at least 30 minutes of exercise that causes your heart to beat faster (aerobic exercise) most days of the week. Activities may include walking, swimming, or biking.  Include exercise to strengthen your muscles (resistance exercise), such as weight lifting, as part of your weekly exercise routine. Try   to do these types of exercises for 30 minutes at least 3 days a week.  Do not use any products that contain nicotine or tobacco, such as cigarettes, e-cigarettes, and chewing tobacco. If you need help quitting, ask your health care  provider.  Control any long-term (chronic) conditions you have, such as high cholesterol or diabetes.  Identify your sources of stress and find ways to manage stress. This may include meditation, deep breathing, or making time for fun activities.   Alcohol use  Do not drink alcohol if: ? Your health care provider tells you not to drink. ? You are pregnant, may be pregnant, or are planning to become pregnant.  If you drink alcohol: ? Limit how much you use to:  0-1 drink a day for women.  0-2 drinks a day for men. ? Be aware of how much alcohol is in your drink. In the [place] one drink equals one 12 oz bottle of beer (355 mL), one 5 oz glass of wine (148 mL), or one 1 oz glass of hard liquor (44 mL). Medicines Your health care provider may prescribe medicine if lifestyle changes are not enough to get your blood pressure under control and if:  Your systolic blood pressure is 130 or higher.  Your diastolic blood pressure is 80 or higher. Take medicines only as told by your health care provider. Follow the directions carefully. Blood pressure medicines must be taken as told by your health care provider. The medicine does not work as well when you skip doses. Skipping doses also puts you at risk for problems. Monitoring Before you monitor your blood pressure:  Do not smoke, drink caffeinated beverages, or exercise within 30 minutes before taking a measurement.  Use the bathroom and empty your bladder (urinate).  Sit quietly for at least 5 minutes before taking measurements. Monitor your blood pressure at home as told by your health care provider. To do this:  Sit with your back straight and supported.  Place your feet flat on the floor. Do not cross your legs.  Support your arm on a flat surface, such as a table. Make sure your upper arm is at heart level.  Each time you measure, take two or three readings one minute apart and record the results. You may also need to have your  blood pressure checked regularly by your health care provider.   General information  Talk with your health care provider about your diet, exercise habits, and other lifestyle factors that may be contributing to hypertension.  Review all the medicines you take with your health care provider because there may be side effects or interactions.  Keep all visits as told by your health care provider. Your health care provider can help you create and adjust your plan for managing your high blood pressure. Where to find more information  National Heart, Lung, and Blood Institute: www.nhlbi.nih.gov  American Heart Association: www.heart.org Contact a health care provider if:  You think you are having a reaction to medicines you have taken.  You have repeated (recurrent) headaches.  You feel dizzy.  You have swelling in your ankles.  You have trouble with your vision. Get help right away if:  You develop a severe headache or confusion.  You have unusual weakness or numbness, or you feel faint.  You have severe pain in your chest or abdomen.  You vomit repeatedly.  You have trouble breathing. These symptoms may represent a serious problem that is an emergency. Do not wait   to see if the symptoms will go away. Get medical help right away. Call your local emergency services (911 in the [place] Do not drive yourself to the hospital. Summary  Hypertension is when the force of blood pumping through your arteries is too strong. If this condition is not controlled, it may put you at risk for serious complications.  Your personal target blood pressure may vary depending on your medical conditions, your age, and other factors. For most people, a normal blood pressure is less than 120/80.  Hypertension is managed by lifestyle changes, medicines, or both.  Lifestyle changes to help manage hypertension include losing weight, eating a healthy, low-sodium diet, exercising more, stopping smoking, and  limiting alcohol. This information is not intended to replace advice given to you by your health care provider. Make sure you discuss any questions you have with your health care provider. Document Revised: 10/23/2019 Document Reviewed: 08/18/2019 Elsevier Patient Education  2021 Elsevier Inc.  

## 2021-02-09 NOTE — Progress Notes (Signed)
Virtual telephone visit    Virtual Visit via Telephone Note   This visit type was conducted due to national recommendations for restrictions regarding the COVID-19 Pandemic (e.g. social distancing) in an effort to limit this patient's exposure and mitigate transmission in our community. Due to her co-morbid illnesses, this patient is at least at moderate risk for complications without adequate follow up. This format is felt to be most appropriate for this patient at this time. The patient did not have access to video technology or had technical difficulties with video requiring transitioning to audio format only (telephone). Physical exam was limited to content and character of the telephone converstion.     Patient location: home Provider location: [place] involved in the visit: patient, provider  I discussed the limitations of evaluation and management by telemedicine and the availability of in person appointments. The patient expressed understanding and agreed to proceed.   Visit Date: 02/09/2021  Today's healthcare provider: Lavon Paganini, MD   Chief Complaint  Patient presents with  . Hypertension   Subjective    HPI  Hypertension, follow-up  BP Readings from Last 3 Encounters:  02/09/21 119/78  01/16/21 109/80  08/23/20 140/86   Wt Readings from Last 3 Encounters:  01/16/21 131 lb 12.8 oz (59.8 kg)  08/23/20 134 lb 3.2 oz (60.9 kg)  08/11/20 133 lb 6.4 oz (60.5 kg)     She was last seen for hypertension 6 months ago.  BP at that visit was 125/82. Management since that visit includes none, continue HCTZ-Losartan.  She reports excellent compliance with treatment. She is not having side effects.  She is following a Regular diet. She is exercising.Walking 30 min a day She does not smoke.  Use of agents associated with hypertension: none.   Outside blood pressures are recent reading 119/78. Symptoms: No chest pain No chest  pressure  No palpitations No syncope  No dyspnea No orthopnea  No paroxysmal nocturnal dyspnea No lower extremity edema   Pertinent labs: Lab Results  Component Value Date   CHOL 196 08/12/2020   HDL 55 08/12/2020   LDLCALC 123 (H) 08/12/2020   TRIG 101 08/12/2020   CHOLHDL 3.6 08/12/2020   Lab Results  Component Value Date   NA 136 08/12/2020   K 4.9 08/12/2020   CREATININE 0.81 08/12/2020   GFRNONAA 70 08/12/2020   GFRAA 80 08/12/2020   GLUCOSE 99 08/12/2020     The 10-year ASCVD risk score Mikey Bussing DC Jr., et al., 2013) is: 27.5%   ---------------------------------------------------------------------------------------------------  Recently had COVID and felt bad for a few days, but now doing better.  Patient Active Problem List   Diagnosis Date Noted  . Dysuria 08/23/2020  . Chronic cough 08/05/2017  . 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 History of adenomatous polyp of colon 05/30/2016  . Allergic rhinitis 11/09/2015  . 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 BP (high blood pressure) 11/09/2015  . 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 OP (osteoporosis) 11/09/2015   Past Medical History:  Diagnosis Date  . Allergy    Environmental Allergies  . Arthritis   . Bronchitis, chronic ([place])   . Cough    CHRONIC  . Dyspnea    OCCAS  . Hypertension   . 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[place])   . Osteoporosis    Allergies  Allergen Reactions  . Lisinopril Cough      Medications: Outpatient Medications Prior to Visit  Medication Sig  . acetaminophen (TYLENOL) 500 MG tablet Take 500 mg by mouth every 6 (six) hours as needed for moderate pain or headache.  . albuterol (VENTOLIN HFA) 108 (90 Base) MCG/ACT inhaler Inhale 2 puffs into the lungs every 6 (six) hours as needed for wheezing or shortness of breath.  . dextromethorphan-guaiFENesin (MUCINEX DM) 30-600 MG 12hr tablet Take 1 tablet by mouth daily.  Marland Kitchen loratadine (CLARITIN)  10 MG tablet Take 10 mg by mouth daily.   Marland Kitchen losartan-hydrochlorothiazide (HYZAAR) 50-12.5 MG tablet TAKE 1 TABLET BY MOUTH ONCE DAILY  . montelukast (SINGULAIR) 10 MG tablet TAKE 1 TABLET BY MOUTH BEDTIME FOR ASTHMA  . SYMBICORT 80-4.5 MCG/ACT inhaler INHALE 2 PUFFS INTO LUNGS TWICE DAILY. RINSE MOUTH AFTER USE (Patient taking differently: as needed.)  . [DISCONTINUED] azithromycin (ZITHROMAX) 250 MG tablet Take 1 tablet (250 mg total) by mouth daily.  . [DISCONTINUED] predniSONE (DELTASONE) 10 MG tablet Take 1 tab po 3 x day for 3 days then take 1 tab po 2 x a day for 3 days and then take 1 tab po daily for 3 days   No facility-administered medications prior to visit.    Review of Systems - per HPI  Last metabolic panel Lab Results  Component Value Date   GLUCOSE 99 08/12/2020   NA 136 08/12/2020   K 4.9 08/12/2020   CL 99 08/12/2020   CO2 23 08/12/2020   BUN 12 08/12/2020   CREATININE 0.81 08/12/2020   GFRNONAA 70 08/12/2020   GFRAA 80 08/12/2020   CALCIUM 9.8 08/12/2020   PROT 6.8 08/12/2020   ALBUMIN 4.7 08/12/2020   LABGLOB 2.1 08/12/2020   AGRATIO 2.2 08/12/2020   BILITOT 0.9 08/12/2020   ALKPHOS 30 (L) 08/12/2020   AST 14 08/12/2020   ALT 7 08/12/2020      Objective    BP 119/78 Comment: home reading     Assessment & Plan     Problem List Items Addressed This Visit      Cardiovascular and Mediastinum   BP (high blood pressure)    Well controlled Continue current medications Recheck metabolic panel F/u in 6 months        Other Visit Diagnoses    Essential hypertension    -  Primary   Relevant Orders   Basic Metabolic Panel (BMET)       Return in about 6 months (around 08/12/2021) for AWV, chronic disease f/u.    I discussed the assessment and treatment plan with the patient. The patient was provided an opportunity to ask questions and all were answered. The patient agreed with the plan and demonstrated an understanding of the instructions.    The patient was advised to call back or seek an in-person evaluation if the symptoms worsen or if the condition fails to improve as anticipated.  I provided 12 minutes of non-face-to-face time during this encounter.  I, Lavon Paganini, MD, have reviewed all documentation for this visit. The documentation on 02/09/21 for the exam, diagnosis, procedures, and orders are all accurate and complete.   Shakeita Vandevander, [name], MD, MPH [facility]

## 2021-02-14 DIAGNOSIS — Z03818 Encounter for observation for suspected exposure to other biological agents ruled out: Secondary | ICD-10-CM | POA: Diagnosis not present

## 2021-02-14 DIAGNOSIS — I10 Essential (primary) hypertension: Secondary | ICD-10-CM | POA: Diagnosis not present

## 2021-02-14 DIAGNOSIS — Z20822 Contact with and (suspected) exposure to covid-19: Secondary | ICD-10-CM | POA: Diagnosis not present

## 2021-02-15 LAB — BASIC METABOLIC PANEL
BUN/Creatinine Ratio: 15 (ref 12–28)
BUN: 10 mg/dL (ref 8–27)
CO2: 24 mmol/L (ref 20–29)
Calcium: 9.3 mg/dL (ref 8.7–10.3)
Chloride: 99 mmol/L (ref 96–106)
Creatinine, Ser: 0.66 mg/dL (ref 0.57–1.00)
Glucose: 90 mg/dL (ref 65–99)
Potassium: 4.7 mmol/L (ref 3.5–5.2)
Sodium: 136 mmol/L (ref 134–144)
eGFR: 89 mL/min/{1.73_m2} (ref >59)

## 2021-02-27 ENCOUNTER — Other Ambulatory Visit: Payer: Self-pay | Admitting: Internal Medicine

## 2021-03-02 DIAGNOSIS — Z86010 Personal history of colonic polyps: Secondary | ICD-10-CM | POA: Diagnosis not present

## 2021-03-06 ENCOUNTER — Other Ambulatory Visit: Payer: Self-pay | Admitting: General Surgery

## 2021-03-06 NOTE — Progress Notes (Signed)
Subjective:     Patient ID: Karen Henson is a 80 y.o. female.  HPI  The following portions of the patient's history were reviewed and updated as appropriate.  This an established patient is here today for: office visit. She is here to discuss havinga colonoscopy, last completed in 2016.  She states her bowels move every other day, no bleeding.  The patient's husband Eddie died in 28.  She has strong family support.  The patient is presently having no GI issues.  Bowel movement every 1-2 days.  No abdominal pain on and off days.  No blood or mucus in the stools.  Review of Systems  Constitutional: Negative for chills and fever.  Respiratory: Negative for cough.   Gastrointestinal: Negative for anal bleeding and blood in stool.       Chief Complaint  Patient presents with  . Evaluation  . Colonoscopy     BP 128/86 (BP Location: Left upper arm, Patient Position: Sitting, BP Cuff Size: Adult)   Pulse 73   Temp 36.3 C (97.3 F) (Temporal)   Ht 154.9 cm (5' 1")   Wt 59.7 kg (131 lb 9.6 oz)   SpO2 98%   BMI 24.87 kg/m       Past Medical History:  Diagnosis Date  . Arthritis   . Bronchitis, chronic (CMS-HCC)   . Chronic cough   . Dyspnea   . Environmental allergies   . Hypertension   . 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CMS-HCC)   . Osteoporosis           Past Surgical History:  Procedure Laterality Date  . BREAST EXCISIONAL BIOPSY Left 1996  . CATARACT EXTRACTION Right 12/05/2017  . CATARACT EXTRACTION Left 10/31/2017  . COLONOSCOPY  03/21/2016   Dr. Bary Castilla  . COLONOSCOPY  02/15/2006  . dilation and curettage of uterus    . history of capsulotomy    . varicose vein surgery        OB History   No obstetric history on file.     Social History          Socioeconomic History  . Marital status: Widowed  Tobacco Use  . Smoking status: Former Smoker    Packs/day: 0.25    Types: Cigarettes  . Smokeless tobacco: Never Used   Vaping Use  . Vaping Use: Never used  Substance and Sexual Activity  . Alcohol use: Yes    Comment: socially  . Drug use: Never           Allergies  Allergen Reactions  . Lisinopril Cough    Current Medications  Current Outpatient Medications  Medication Sig Dispense Refill  . acetaminophen (TYLENOL) 500 MG tablet Take by mouth    . albuterol 90 mcg/actuation inhaler Inhale into the lungs    . budesonide-formoteroL (SYMBICORT) 80-4.5 mcg/actuation inhaler INHALE 2 PUFFS INTO LUNGS TWICE DAILY. RINSE MOUTH AFTER USE    . dextromethorphan-guaifenesin (MUCINEX DM) 30-600 mg ER tablet Take 1 tablet by mouth once daily    . lisinopriL (ZESTRIL) 10 MG tablet Take 10 mg by mouth once daily    . loratadine (CLARITIN) 10 mg tablet Take 10 mg by mouth once daily    . losartan-hydrochlorothiazide (HYZAAR) 50-12.5 mg tablet Take 1 tablet by mouth once daily    . montelukast (SINGULAIR) 10 mg tablet TAKE 1 TABLET BY MOUTH BEDTIME FOR ASTHMA     No current facility-administered medications for this visit.  Family History  Problem Relation Age of Onset  . High blood pressure (Hypertension) Mother   . Emphysema Father   . Stroke Sister   . Stroke Brother   . Diabetes Maternal Grandmother   . Diabetes Maternal Aunt   . Lung cancer Grandchild   . Breast cancer Neg Hx   . Ovarian cancer Neg Hx   . Colon cancer Neg Hx         Objective:   Physical Exam Exam conducted with a chaperone present.  Constitutional:      Appearance: Normal appearance.  Cardiovascular:     Rate and Rhythm: Normal rate and regular rhythm.     Pulses: Normal pulses.     Heart sounds: Normal heart sounds.  Pulmonary:     Effort: Pulmonary effort is normal.     Breath sounds: Normal breath sounds.  Musculoskeletal:     Cervical back: Neck supple.  Skin:    General: Skin is warm and dry.  Neurological:     Mental Status: She is alert and oriented to  person, place, and time.  Psychiatric:        Mood and Affect: Mood normal.        Behavior: Behavior normal.     Labs and Radiology:   Feb 14, 2021 laboratory:  Glucose 65 - 99 mg/dL 90   BUN 8 - 27 mg/dL 10   Creatinine, Ser 0.57 - 1.00 mg/dL 0.66   eGFR >59 mL/min/1.73 89   BUN/Creatinine Ratio 12 - 28 15   Sodium 134 - 144 mmol/L 136   Potassium 3.5 - 5.2 mmol/L 4.7   Chloride 96 - 106 mmol/L 99   CO2 20 - 29 mmol/L 24   Calcium 8.7 - 10.3 mg/dL 9.3     March 21, 2016.  6 mm transverse colon polyp:  DIAGNOSIS:  A. COLON POLYP, TRANSVERSE; COLD BIOPSY:  - TUBULAR ADENOMA.  - NEGATIVE FOR HIGH-GRADE DYSPLASIA AND MALIGNANCY.     Assessment:      Potential candidate for repeat colonoscopy.   Plan:     At the time of the patient's 2017 exam recommendations at that time were for a 5-year follow-up.  Most recent data from 2020 reports that patients with less than or equal to 2 polyps under 10 mm can be screened safely on a 7 to 10-year interval.  Original screening.  Is still reasonable in selected patients.  The patient's options at present are to complete a colonoscopy now (risk reviewed) versus defer anywhere between 2 and 5 years.  At this time, I think she is leaning towards getting this over with, but she will be contacted next week by the scheduling staff and let the office know her final decision.    This note is partially prepared by Marsha Hatch, RN, acting as a scribe in the presence of Dr. Jeffrey Byrnett, MD.   The documentation recorded by the scribe accurately reflects the service I personally performed and the decisions made by me.   Jeffrey W. Byrnett, MD FACS    

## 2021-03-28 ENCOUNTER — Encounter: Payer: Self-pay | Admitting: General Surgery

## 2021-03-29 ENCOUNTER — Ambulatory Visit
Admission: RE | Admit: 2021-03-29 | Discharge: 2021-03-29 | Disposition: A | Discharge status: Home / Self Care | Payer: Medicare Other | Attending: General Surgery | Admitting: General Surgery

## 2021-03-29 ENCOUNTER — Encounter: Payer: Self-pay | Admitting: General Surgery

## 2021-03-29 ENCOUNTER — Ambulatory Visit: Payer: Medicare Other | Admitting: Anesthesiology

## 2021-03-29 ENCOUNTER — Encounter
Admission: RE | Disposition: A | Discharge status: Home / Self Care | Payer: Self-pay | Source: Home / Self Care | Attending: General Surgery

## 2021-03-29 DIAGNOSIS — K5730 Diverticulosis of large intestine without perforation or abscess without bleeding: Secondary | ICD-10-CM | POA: Insufficient documentation

## 2021-03-29 DIAGNOSIS — Z87891 Personal history of nicotine dependence: Secondary | ICD-10-CM | POA: Insufficient documentation

## 2021-03-29 DIAGNOSIS — Z79899 Other long term (current) drug therapy: Secondary | ICD-10-CM | POA: Insufficient documentation

## 2021-03-29 DIAGNOSIS — Z86010 Personal history of colonic polyps: Secondary | ICD-10-CM | POA: Insufficient documentation

## 2021-03-29 DIAGNOSIS — Z1211 Encounter for screening for malignant neoplasm of colon: Secondary | ICD-10-CM | POA: Insufficient documentation

## 2021-03-29 DIAGNOSIS — Z888 Allergy status to other drugs, medicaments and biological substances status: Secondary | ICD-10-CM | POA: Insufficient documentation

## 2021-03-29 DIAGNOSIS — I10 Essential (primary) hypertension: Secondary | ICD-10-CM | POA: Diagnosis not present

## 2021-03-29 DIAGNOSIS — K5732 Diverticulitis of large intestine without perforation or abscess without bleeding: Secondary | ICD-10-CM | POA: Diagnosis not present

## 2021-03-29 DIAGNOSIS — Z7951 Long term (current) use of inhaled steroids: Secondary | ICD-10-CM | POA: Insufficient documentation

## 2021-03-29 HISTORY — DX: Chronic obstructive pulmonary disease, unspecified: J44.9

## 2021-03-29 HISTORY — PX: COLONOSCOPY WITH PROPOFOL: SHX5780

## 2021-03-29 SURGERY — COLONOSCOPY WITH PROPOFOL
Anesthesia: General

## 2021-03-29 MED ORDER — LIDOCAINE HCL (CARDIAC) PF 100 MG/5ML IV SOSY
PREFILLED_SYRINGE | INTRAVENOUS | Status: DC | PRN
  Administered 2021-03-29: 40 mg via INTRAVENOUS

## 2021-03-29 MED ORDER — EPHEDRINE SULFATE 50 MG/ML IJ SOLN
INTRAMUSCULAR | Status: DC | PRN
  Administered 2021-03-29 (×2): 5 mg via INTRAVENOUS

## 2021-03-29 MED ORDER — SODIUM CHLORIDE 0.9 % IV SOLN
INTRAVENOUS | Status: DC
  Administered 2021-03-29: 20 mL/h via INTRAVENOUS

## 2021-03-29 MED ORDER — PROPOFOL 10 MG/ML IV BOLUS
INTRAVENOUS | Status: DC | PRN
  Administered 2021-03-29: 70 mg via INTRAVENOUS

## 2021-03-29 MED ORDER — PHENYLEPHRINE HCL (PRESSORS) 10 MG/ML IV SOLN
INTRAVENOUS | Status: AC
  Filled 2021-03-29: qty 1

## 2021-03-29 MED ORDER — PROPOFOL 500 MG/50ML IV EMUL
INTRAVENOUS | Status: DC | PRN
  Administered 2021-03-29: 150 ug/kg/min via INTRAVENOUS

## 2021-03-29 MED ORDER — PROPOFOL 500 MG/50ML IV EMUL
INTRAVENOUS | Status: AC
  Filled 2021-03-29: qty 50

## 2021-03-29 NOTE — Anesthesia Preprocedure Evaluation (Signed)
Anesthesia Evaluation  Patient identified by MRN, date of birth, ID band Patient awake    Reviewed: Allergy & Precautions, NPO status , Patient's Chart, lab work & pertinent test results  History of Anesthesia Complications Negative for: history of anesthetic complications  Airway Mallampati: III  TM Distance: <3 FB Neck ROM: limited    Dental  (+) Poor Dentition, Missing   Pulmonary shortness of breath and with exertion, COPD, former smoker,    Pulmonary exam normal        Cardiovascular Exercise Tolerance: Good hypertension, (-) anginaNormal cardiovascular exam     Neuro/Psych negative neurological ROS  negative psych ROS   GI/Hepatic negative GI ROS, Neg liver ROS,   Endo/Other  negative endocrine ROS  Renal/GU negative Renal ROS  negative genitourinary   Musculoskeletal  (+) Arthritis ,   Abdominal   Peds  Hematology negative hematology ROS (+)   Anesthesia Other Findings Past Medical History: No date: Allergy     Comment:  Environmental Allergies No date: Arthritis No date: Bronchitis, chronic (HCC) No date: COPD (chronic obstructive pulmonary disease) (HCC) No date: Cough     Comment:  CHRONIC No date: Dyspnea     Comment:  OCCAS No date: Hypertension No date: 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[place]) No date: Osteoporosis  Past Surgical History: 1996: BREAST EXCISIONAL BIOPSY; Left     Comment:  neg No date: BREAST SURGERY No date: CAPSULOTOMY 10/31/2017: CATARACT EXTRACTION W/PHACO; Left     Comment:  Procedure: CATARACT EXTRACTION PHACO AND INTRAOCULAR               LENS PLACEMENT (IOC);  Surgeon: Eulogio Bear, MD;                Location: [facility] ORS;  Service: Ophthalmology;  Laterality:              Left;  [place] 00:43.7 AP% 19.6 CDE 8.57 FLUID PACK LOT #               [id number] 12/05/2017: CATARACT EXTRACTION W/PHACO; Right     Comment:  Procedure: CATARACT EXTRACTION PHACO AND INTRAOCULAR                LENS PLACEMENT (IOC);  Surgeon: Eulogio Bear, MD;                Location: [facility] ORS;  Service: Ophthalmology;  Laterality:              Right;  fluid pack lot #[id number]  exp 07/01/2019 [place]                  00:31.4 AP%    8.8 CDE    2.79 02/15/2006: COLONOSCOPY 03/21/2016: COLONOSCOPY WITH PROPOFOL; N/A     Comment:  Procedure: COLONOSCOPY WITH PROPOFOL;  Surgeon: Robert Bellow, MD;  Location: [facility] ENDOSCOPY;  Service:               Endoscopy;  Laterality: N/A; No date: DILATION AND CURETTAGE OF UTERUS No date: EYE SURGERY No date: VARICOSE VEIN SURGERY  BMI    Body Mass Index: 24.14 kg/m      Reproductive/Obstetrics negative OB ROS                             Anesthesia Physical Anesthesia Plan  ASA: 3  Anesthesia Plan: General   Post-op Pain Management:  Induction: Intravenous  PONV Risk Score and Plan: Propofol infusion and TIVA  Airway Management Planned: Natural Airway and Nasal Cannula  Additional Equipment:   Intra-op Plan:   Post-operative Plan:   Informed Consent: I have reviewed the patients History and Physical, chart, labs and discussed the procedure including the risks, benefits and alternatives for the proposed anesthesia with the patient or authorized representative who has indicated his/her understanding and acceptance.     Dental Advisory Given  Plan Discussed with: Anesthesiologist, CRNA and Surgeon  Anesthesia Plan Comments: (Patient consented for risks of anesthesia including but not limited to:  - adverse reactions to medications - risk of airway placement if required - damage to eyes, teeth, lips or other oral mucosa - nerve damage due to positioning  - sore throat or hoarseness - Damage to heart, brain, nerves, lungs, other parts of body or loss of life  Patient voiced understanding.)        Anesthesia Quick Evaluation

## 2021-03-29 NOTE — Op Note (Signed)
[facility] Patient Name: Karen Henson Procedure Date: 03/29/2021 7:29 AM MRN: [id number] Account #: [id number] Date of Birth: [birth year] Admit Type: Outpatient Age: 80 Room: [facility] ENDO ROOM 1 Gender: Female Note Status: Finalized Procedure:             Colonoscopy Indications:           High risk colon cancer surveillance: Personal history                         of colonic polyps Providers:             Robert Bellow, MD Medicines:             Propofol per Anesthesia Complications:         No immediate complications. Procedure:             Pre-Anesthesia Assessment:                        - Prior to the procedure, a History and Physical was                         performed, and patient medications, allergies and                         sensitivities were reviewed. The patient's tolerance                         of previous anesthesia was reviewed.                        - The risks and benefits of the procedure and the                         sedation options and risks were discussed with the                         patient. All questions were answered and informed                         consent was obtained.                        After obtaining informed consent, the colonoscope was                         passed under direct vision. Throughout the procedure,                         the patient's blood pressure, pulse, and oxygen                         saturations were monitored continuously. The was                         introduced through the anus and advanced to the the                         terminal ileum. The colonoscopy was somewhat difficult  due to multiple diverticula in the colon and                         significant looping. Successful completion of the                         procedure was aided by using manual pressure. The                         patient tolerated the procedure well. The  quality of                         the bowel preparation was excellent. Findings:      Multiple medium-mouthed diverticula were found in the recto-sigmoid       colon, sigmoid colon and descending colon.      The retroflexed view of the distal rectum and anal verge was normal and       showed no anal or rectal abnormalities.      The terminal ileum appeared normal. 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                        - The distal rectum and anal verge are normal on                         retroflexion view.                        - The examined portion of the ileum was normal.                        - No specimens collected. Recommendation:        - Discharge patient to home (via wheelchair).                        - High fiber diet indefinitely. Procedure Code(s):     --- Professional ---                        [phone], Colonoscopy, flexible; diagnostic, including                         collection of specimen(s) by brushing or washing, when                         performed (separate procedure) Diagnosis Code(s):     --- Professional ---                        K57.30, Diverticulosis of large intestine without                         perforation or abscess without bleeding                        Z86.010, Personal history of  colonic polyps CPT copyright 2019 [employer]. All rights reserved. The codes documented in this report are preliminary and upon coder review may  be revised to meet current compliance requirements. Robert Bellow, MD 03/29/2021 8:04:59 AM This report has been signed electronically. Number of Addenda: 0 Note Initiated On: 03/29/2021 7:29 AM Scope Withdrawal Time: 0 hours 8 minutes 34 seconds  Total Procedure Duration: 0 hours 26 minutes 24 seconds  Estimated Blood Loss:  Estimated blood loss: none.      [facility]

## 2021-03-29 NOTE — Anesthesia Postprocedure Evaluation (Signed)
Anesthesia Post Note  Patient: Karen Henson  Procedure(s) Performed: COLONOSCOPY WITH PROPOFOL  Patient location during evaluation: Endoscopy Anesthesia Type: General Level of consciousness: awake and alert Pain management: pain level controlled Vital Signs Assessment: post-procedure vital signs reviewed and stable Respiratory status: spontaneous breathing, nonlabored ventilation, respiratory function stable and patient connected to nasal cannula oxygen Cardiovascular status: blood pressure returned to baseline and stable Postop Assessment: no apparent nausea or vomiting Anesthetic complications: no   No notable events documented.   Last Vitals:  Vitals:   03/29/21 0815 03/29/21 0825  BP: 115/83 128/81  Pulse: 73 67  Resp: 17 16  Temp:    SpO2: 97% 100%    Last Pain:  Vitals:   03/29/21 0825  TempSrc:   PainSc: 0-No pain                 Precious Haws Mikyla Schachter

## 2021-03-29 NOTE — Transfer of Care (Signed)
Immediate Anesthesia Transfer of Care Note  Patient: Karen Henson  Procedure(s) Performed: COLONOSCOPY WITH PROPOFOL  Patient Location: PACU and Endoscopy Unit  Anesthesia Type:General  Level of Consciousness: drowsy  Airway & Oxygen Therapy: Patient Spontanous Breathing  Post-op Assessment: Report given to RN and Post -op Vital signs reviewed and stable  Post vital signs: Reviewed and stable  Last Vitals:  Vitals Value Taken Time  BP 106/70 03/29/21 0805  Temp    Pulse 79 03/29/21 0806  Resp 15 03/29/21 0806  SpO2 96 % 03/29/21 0806  Vitals shown include unvalidated device data.  Last Pain:  Vitals:   03/29/21 0655  TempSrc: Temporal  PainSc: 0-No pain         Complications: No notable events documented.

## 2021-03-29 NOTE — H&P (Signed)
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 For follow up exam. Tolerated prep well.   Medications Prior to Admission  Medication Sig Dispense Refill Last Dose   acetaminophen (TYLENOL) 500 MG tablet Take 500 mg by mouth every 6 (six) hours as needed for moderate pain or headache.   Past Week   albuterol (VENTOLIN HFA) 108 (90 Base) MCG/ACT inhaler Inhale 2 puffs into the lungs every 6 (six) hours as needed for wheezing or shortness of breath.   Past Week   dextromethorphan-guaiFENesin (MUCINEX DM) 30-600 MG 12hr tablet Take 1 tablet by mouth daily.   Past Week   loratadine (CLARITIN) 10 MG tablet Take 10 mg by mouth daily.    Past Week   losartan-hydrochlorothiazide (HYZAAR) 50-12.5 MG tablet TAKE 1 TABLET BY MOUTH ONCE DAILY 90 tablet 1 Past Week   montelukast (SINGULAIR) 10 MG tablet TAKE 1 TABLET BY MOUTH BEDTIME FOR ASTHMA 30 tablet 0 Past Week   SYMBICORT 80-4.5 MCG/ACT inhaler INHALE 2 PUFFS INTO LUNGS TWICE DAILY. 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[place])    Osteoporosis    Past Surgical History:  Procedure Laterality Date   BREAST EXCISIONAL BIOPSY Left 1996   neg   BREAST SURGERY     CAPSULOTOMY     CATARACT EXTRACTION W/PHACO Left 10/31/2017   Procedure: CATARACT EXTRACTION PHACO AND INTRAOCULAR LENS PLACEMENT ([place]);  Surgeon: Eulogio Bear, MD;  Location: [facility] ORS;  Service: Ophthalmology;  Laterality: Left;  [place] 00:43.7 AP% 19.6 CDE 8.57 FLUID PACK LOT # [id number]   CATARACT EXTRACTION W/PHACO Right 12/05/2017   Procedure: CATARACT EXTRACTION  PHACO AND INTRAOCULAR LENS PLACEMENT ([facility]);  Surgeon: Eulogio Bear, MD;  Location: [facility] ORS;  Service: Ophthalmology;  Laterality: Right;  fluid pack lot #[id number]  exp 07/01/2019 [place]    00:31.4 AP%    8.8 CDE    2.79   COLONOSCOPY  02/15/2006   COLONOSCOPY WITH PROPOFOL N/A 03/21/2016   Procedure: COLONOSCOPY WITH PROPOFOL;  Surgeon: Robert Bellow, MD;  Location: [facility] ENDOSCOPY;  Service: Endoscopy;  Laterality: N/A;   DILATION AND CURETTAGE OF UTERUS     EYE SURGERY     VARICOSE VEIN SURGERY     Social History   Socioeconomic History   Marital status: Widowed    Spouse name: Not on file   Number of children: 2   Years of education: Not on file   Highest education level: High school graduate  Occupational History   Occupation: retired  Tobacco Use   Smoking status: Former    Packs/day: 0.25    Pack years: 0.00    Types: Cigarettes   Smokeless tobacco: Never   Tobacco comments:    smoked a pack a week, quit about 30 years ago  Vaping Use   Vaping Use: Never used  Substance and Sexual Activity   Alcohol use: Yes    Comment: ocassional- monthly   Drug use: No   Sexual activity: Never  Other Topics Concern   Not on file  Social History Narrative   Not on file   Social Determinants of Health   Financial Resource Strain: Low Risk    Difficulty of Paying Living Expenses: Not hard at all  Food Insecurity: No Food Insecurity   Worried About Charity fundraiser in the Last Year: Never true   [place] in the Last Year: Never true  Transportation Needs: No Transportation Needs   Lack of Transportation (Medical): No   Lack of Transportation (Non-Medical): No  Physical Activity: Sufficiently Active   Days of Exercise per Week: 5 days   Minutes of Exercise per Session: 30 min  Stress: No Stress Concern Present   Feeling of Stress : Not at all  Social Connections: Moderately Integrated   Frequency of Communication with Friends and Family: More than three times  a week   Frequency of Social Gatherings with Friends and Family: More than three times a week   Attends Religious Services: More than 4 times per year   Active Member of [employer] or Organizations: Yes   Attends Archivist Meetings: More than 4 times per year   Marital Status: Widowed  Human resources officer Violence: Not At Risk   Fear of Current or Ex-Partner: No   Emotionally Abused: No   Physically Abused: No   Sexually Abused: No   Social History   Social History Narrative   Not on file     ROS: Negative.     PE: HEENT: Negative. Lungs: Clear. Cardio: RR.   Assessment/Plan:  Proceed with planned endoscopy.   Forest Gleason [facility] 03/29/2021

## 2021-03-30 ENCOUNTER — Encounter: Payer: Self-pay | Admitting: General Surgery

## 2021-03-30 ENCOUNTER — Other Ambulatory Visit: Payer: Self-pay | Admitting: Internal Medicine

## 2021-03-30 NOTE — Telephone Encounter (Signed)
Pt need appt for refiills

## 2021-04-25 DIAGNOSIS — Z23 Encounter for immunization: Secondary | ICD-10-CM | POA: Diagnosis not present

## 2021-05-01 ENCOUNTER — Other Ambulatory Visit: Payer: Self-pay | Admitting: Internal Medicine

## 2021-05-01 ENCOUNTER — Other Ambulatory Visit: Payer: Self-pay | Admitting: Family Medicine

## 2021-05-01 NOTE — Telephone Encounter (Signed)
Requested Prescriptions  Pending Prescriptions Disp Refills  . losartan-hydrochlorothiazide (HYZAAR) 50-12.5 MG tablet [Pharmacy Med Name: LOSARTAN POTASSIUM-HCTZ 50-12.5 MG] 90 tablet 1    Sig: TAKE 1 TABLET BY MOUTH ONCE DAILY     Cardiovascular: ARB + Diuretic Combos Failed - 05/01/2021  9:36 AM      Failed - Valid encounter within last 6 months    Recent Outpatient Visits          2 months ago Essential hypertension   [facility] [place], [name], MD   8 months ago Acute cystitis with hematuria   [place], [name], FNP   8 months ago Primary hypertension   [facility] [place], [name], MD   1 year ago Essential hypertension   [facility] [place], [name], MD   1 year ago Essential hypertension   [place], [name], MD      Future Appointments            In 3 months Bacigalupo, [name], MD [facility]           Passed - K in normal range and within 180 days    Potassium  Date Value Ref Range Status  02/14/2021 4.7 3.5 - 5.2 mmol/L Final  07/16/2013 4.4 3.5 - 5.1 mmol/L Final         Passed - Na in normal range and within 180 days    Sodium  Date Value Ref Range Status  02/14/2021 136 134 - 144 mmol/L Final         Passed - Cr in normal range and within 180 days    Creat  Date Value Ref Range Status  08/05/2017 0.77 0.60 - 0.93 mg/dL Final    Comment:    For patients >64 years of age, the reference limit for Creatinine is approximately 13% higher for people identified as African-American. .    Creatinine, Ser  Date Value Ref Range Status  02/14/2021 0.66 0.57 - 1.00 mg/dL Final         Passed - Ca in normal range and within 180 days    Calcium  Date Value Ref Range Status  02/14/2021 9.3 8.7 - 10.3 mg/dL Final         Passed - Patient is not pregnant      Passed - Last BP in normal range    BP Readings from Last 1  Encounters:  03/29/21 128/81

## 2021-07-27 ENCOUNTER — Other Ambulatory Visit: Payer: Self-pay | Admitting: Family Medicine

## 2021-07-27 DIAGNOSIS — Z1231 Encounter for screening mammogram for malignant neoplasm of breast: Secondary | ICD-10-CM

## 2021-08-04 DIAGNOSIS — Z23 Encounter for immunization: Secondary | ICD-10-CM | POA: Diagnosis not present

## 2021-08-09 ENCOUNTER — Other Ambulatory Visit: Payer: Self-pay | Admitting: Family Medicine

## 2021-08-09 NOTE — Telephone Encounter (Signed)
Requested medication (s) are due for refill today: No  Requested medication (s) are on the active medication list: Yes  Last refill:  05/01/21 #90/1 RF  Future visit scheduled: Yes  Notes to clinic:  Unable to refill per protocol, appointment needed. Pt has appt 08/16/21     Requested Prescriptions  Pending Prescriptions Disp Refills   losartan-hydrochlorothiazide (HYZAAR) 50-12.5 MG tablet [Pharmacy Med Name: LOSARTAN POTASSIUM-HCTZ 50-12.5 MG] 90 tablet 1    Sig: TAKE 1 TABLET BY MOUTH ONCE DAILY     Cardiovascular: ARB + Diuretic Combos Failed - 08/09/2021  1:55 PM      Failed - Valid encounter within last 6 months    Recent Outpatient Visits           6 months ago Essential hypertension   [facility] [name], [name], MD   11 months ago Acute cystitis with hematuria   [name], [name], FNP   12 months ago Primary hypertension   [facility] [name], [name], MD   1 year ago Essential hypertension   [name], [name], MD   1 year ago Essential hypertension   [name], [name], MD       Future Appointments             In 1 week Bacigalupo, [name], MD [facility]            Passed - K in normal range and within 180 days    Potassium  Date Value Ref Range Status  02/14/2021 4.7 3.5 - 5.2 mmol/L Final  07/16/2013 4.4 3.5 - 5.1 mmol/L Final          Passed - Na in normal range and within 180 days    Sodium  Date Value Ref Range Status  02/14/2021 136 134 - 144 mmol/L Final          Passed - Cr in normal range and within 180 days    Creat  Date Value Ref Range Status  08/05/2017 0.77 0.60 - 0.93 mg/dL Final    Comment:    For patients >40 years of age, the reference limit for Creatinine is approximately 13% higher for people identified as African-American. .    Creatinine, Ser  Date Value Ref Range  Status  02/14/2021 0.66 0.57 - 1.00 mg/dL Final          Passed - Ca in normal range and within 180 days    Calcium  Date Value Ref Range Status  02/14/2021 9.3 8.7 - 10.3 mg/dL Final          Passed - Patient is not pregnant      Passed - Last BP in normal range    BP Readings from Last 1 Encounters:  03/29/21 128/81

## 2021-08-10 DIAGNOSIS — Z23 Encounter for immunization: Secondary | ICD-10-CM | POA: Diagnosis not present

## 2021-08-10 NOTE — Telephone Encounter (Signed)
Patient just picked up refill this month for #90 supply

## 2021-08-14 ENCOUNTER — Other Ambulatory Visit: Payer: Self-pay

## 2021-08-14 ENCOUNTER — Ambulatory Visit
Admission: RE | Admit: 2021-08-14 | Discharge: 2021-08-14 | Disposition: A | Discharge status: Home / Self Care | Payer: Medicare Other | Source: Ambulatory Visit | Attending: Family Medicine | Admitting: Family Medicine

## 2021-08-14 DIAGNOSIS — Z1231 Encounter for screening mammogram for malignant neoplasm of breast: Secondary | ICD-10-CM | POA: Diagnosis not present

## 2021-08-17 ENCOUNTER — Other Ambulatory Visit: Payer: Self-pay

## 2021-08-17 ENCOUNTER — Encounter: Payer: Self-pay | Admitting: Family Medicine

## 2021-08-17 ENCOUNTER — Ambulatory Visit (INDEPENDENT_AMBULATORY_CARE_PROVIDER_SITE_OTHER): Payer: Medicare Other | Admitting: Family Medicine

## 2021-08-17 VITALS — BP 126/89 | HR 68 | Temp 97.6°F | Resp 16 | Ht 62.0 in | Wt 134.8 lb

## 2021-08-17 DIAGNOSIS — J8410 Pulmonary fibrosis, unspecified: Secondary | ICD-10-CM | POA: Diagnosis not present

## 2021-08-17 DIAGNOSIS — I10 Essential (primary) hypertension: Secondary | ICD-10-CM | POA: Diagnosis not present

## 2021-08-17 DIAGNOSIS — Z0000 Encounter for general adult medical examination without abnormal findings: Secondary | ICD-10-CM | POA: Diagnosis not present

## 2021-08-17 DIAGNOSIS — M810 Age-related osteoporosis without current pathological fracture: Secondary | ICD-10-CM

## 2021-08-17 DIAGNOSIS — E782 Mixed hyperlipidemia: Secondary | ICD-10-CM | POA: Diagnosis not present

## 2021-08-17 NOTE — Progress Notes (Signed)
Annual Wellness Visit     Patient: Karen Henson, Female    DOB: Feb 12, 1941, 80 y.o.   MRN: [id number] Visit Date: 08/17/2021  Today's Provider: Lavon Paganini, MD   Chief Complaint  Patient presents with   Medicare Wellness   Subjective    Karen Henson is a 80 y.o. female who presents today for her Annual Wellness Visit. She reports consuming a general diet. The patient does not participate in regular exercise at present. She generally feels well. She reports sleeping well. She does not have additional problems to discuss today.   HPI   Medications: Outpatient Medications Prior to Visit  Medication Sig   acetaminophen (TYLENOL) 500 MG tablet Take 500 mg by mouth every 6 (six) hours as needed for moderate pain or headache.   albuterol (VENTOLIN HFA) 108 (90 Base) MCG/ACT inhaler Inhale 2 puffs into the lungs every 6 (six) hours as needed for wheezing or shortness of breath.   dextromethorphan-guaiFENesin (MUCINEX DM) 30-600 MG 12hr tablet Take 1 tablet by mouth daily.   loratadine (CLARITIN) 10 MG tablet Take 10 mg by mouth daily.    losartan-hydrochlorothiazide (HYZAAR) 50-12.5 MG tablet TAKE 1 TABLET BY MOUTH ONCE DAILY   montelukast (SINGULAIR) 10 MG tablet TAKE 1 TABLET BY MOUTH BEDTIME FOR ASTHMA   SYMBICORT 80-4.5 MCG/ACT inhaler INHALE 2 PUFFS INTO LUNGS TWICE DAILY. RINSE MOUTH AFTER USE (Patient taking differently: as needed.)   No facility-administered medications prior to visit.    Allergies  Allergen Reactions   Lisinopril Cough    Patient Care Team: Virginia Crews, MD as PCP - General (Family Medicine) Bary Castilla, Forest Gleason, MD (General Surgery) Allyne Gee, MD as Consulting Physician (Internal Medicine) [facility], [facility])  Review of Systems  Respiratory:  Positive for shortness of breath.   All other systems reviewed and are negative.  Last CBC Lab Results  Component Value Date   WBC 7.0 08/11/2019   HGB 14.3  08/11/2019   HCT 41.9 08/11/2019   MCV 94 08/11/2019   MCH 32.0 08/11/2019   RDW 12.7 08/11/2019   PLT 290 38/18/2993   Last metabolic panel Lab Results  Component Value Date   GLUCOSE 90 02/14/2021   NA 136 02/14/2021   K 4.7 02/14/2021   CL 99 02/14/2021   CO2 24 02/14/2021   BUN 10 02/14/2021   CREATININE 0.66 02/14/2021   EGFR 89 02/14/2021   CALCIUM 9.3 02/14/2021   PROT 6.8 08/12/2020   ALBUMIN 4.7 08/12/2020   LABGLOB 2.1 08/12/2020   AGRATIO 2.2 08/12/2020   BILITOT 0.9 08/12/2020   ALKPHOS 30 (L) 08/12/2020   AST 14 08/12/2020   ALT 7 08/12/2020   Last lipids Lab Results  Component Value Date   CHOL 196 08/12/2020   HDL 55 08/12/2020   LDLCALC 123 (H) 08/12/2020   TRIG 101 08/12/2020   CHOLHDL 3.6 08/12/2020    Last thyroid functions Lab Results  Component Value Date   TSH 1.760 01/30/2016      Objective    Vitals: BP 126/89 (BP Location: Left Arm, Patient Position: Sitting, Cuff Size: Normal)   Pulse 68   Temp 97.6 F (36.4 C) (Temporal)   Resp 16   Ht $R'5\' 2"'AV$  (1.575 m)   Wt 134 lb 12.8 oz (61.1 kg)   SpO2 99%   BMI 24.66 kg/m  BP Readings from Last 3 Encounters:  08/17/21 126/89  03/29/21 128/81  02/09/21 119/78   Wt  Readings from Last 3 Encounters:  08/17/21 134 lb 12.8 oz (61.1 kg)  03/29/21 132 lb (59.9 kg)  01/16/21 131 lb 12.8 oz (59.8 kg)      Physical Exam Vitals reviewed.  Constitutional:      General: She is not in acute distress.    Appearance: Normal appearance. She is well-developed. She is not diaphoretic.  HENT:     Head: Normocephalic and atraumatic.     Right Ear: Tympanic membrane, ear canal and external ear normal.     Left Ear: Tympanic membrane, ear canal and external ear normal.     Nose: Nose normal.     Mouth/Throat:     Mouth: Mucous membranes are moist.     Pharynx: Oropharynx is clear. No oropharyngeal exudate.  Eyes:     General: No scleral icterus.    Conjunctiva/sclera: Conjunctivae normal.      Pupils: Pupils are equal, round, and reactive to light.  Neck:     Thyroid: No thyromegaly.  Cardiovascular:     Rate and Rhythm: Normal rate and regular rhythm.     Pulses: Normal pulses.     Heart sounds: Normal heart sounds. No murmur heard. Pulmonary:     Effort: Pulmonary effort is normal. No respiratory distress.     Breath sounds: Normal breath sounds. No wheezing or rales.  Abdominal:     General: There is no distension.     Palpations: Abdomen is soft.     Tenderness: There is no abdominal tenderness.  Musculoskeletal:        General: No deformity.     Cervical back: Neck supple.     Right lower leg: No edema.     Left lower leg: No edema.  Lymphadenopathy:     Cervical: No cervical adenopathy.  Skin:    General: Skin is warm and dry.     Findings: No rash.  Neurological:     Mental Status: She is alert and oriented to person, place, and time. Mental status is at baseline.     Sensory: No sensory deficit.     Motor: No weakness.     Gait: Gait normal.  Psychiatric:        Mood and Affect: Mood normal.        Behavior: Behavior normal.        Thought Content: Thought content normal.     Most recent functional status assessment: In your present state of health, do you have any difficulty performing the following activities: 08/17/2021  Hearing? N  Vision? N  Difficulty concentrating or making decisions? N  Walking or climbing stairs? N  Dressing or bathing? N  Doing errands, shopping? N  Some recent data might be hidden   Most recent fall risk assessment: Fall Risk  08/17/2021  Falls in the past year? 0  Number falls in past yr: 0  Injury with Fall? 0  Risk for fall due to : No Fall Risks  Follow up Falls evaluation completed    Most recent depression screenings: PHQ 2/9 Scores 08/17/2021 08/11/2020  PHQ - 2 Score 0 0  PHQ- 9 Score 0 -   Most recent cognitive screening: 6CIT Screen 08/17/2021  What Year? 0 points  What month? 0 points  What time?  0 points  Count back from 20 0 points  Months in reverse 0 points  Repeat phrase 2 points  Total Score 2   Most recent Audit-C alcohol use screening Alcohol Use Disorder Test (AUDIT) 08/17/2021  1. How often do you have a drink containing alcohol? 2  2. How many drinks containing alcohol do you have on a typical day when you are drinking? 0  3. How often do you have six or more drinks on one occasion? 0  AUDIT-C Score 2  Alcohol Brief Interventions/Follow-up -   A score of 3 or more in women, and 4 or more in men indicates increased risk for alcohol abuse, EXCEPT if all of the points are from question 1   No results found for any visits on 08/17/21.  Assessment & Plan     Annual wellness visit done today including the all of the following: Reviewed patient's Family Medical History Reviewed and updated list of patient's medical providers Assessment of cognitive impairment was done Assessed patient's functional ability Established a written schedule for health screening Routt Completed and Reviewed  Exercise Activities and Dietary recommendations  Goals      DIET - REDUCE SUGAR INTAKE     Recommend to monitor sugar intake and avoid sweets in daily diet.         Immunization History  Administered Date(s) Administered   Influenza Inj Mdck Quad Pf 05/29/2019   Influenza, High Dose Seasonal PF 07/23/2017, 07/21/2018, 07/12/2020   Influenza-Unspecified 08/01/2015, 07/27/2021   Moderna Sars-Covid-2 Vaccination 10/13/2019, 11/10/2019, 08/17/2020   Pneumococcal Conjugate-13 08/01/2015   Pneumococcal Polysaccharide-23 07/26/2014   Td 02/26/2008   Zoster Recombinat (Shingrix) 07/21/2018, 09/26/2018   Zoster, Live 03/17/2009    Health Maintenance  Topic Date Due   COVID-19 Vaccine (4 - Booster for Moderna series) 10/12/2020   DEXA SCAN  08/16/2021   TETANUS/TDAP  08/07/2023 (Originally 02/25/2018)   Pneumonia Vaccine 11+ Years old  Completed    INFLUENZA VACCINE  Completed   Zoster Vaccines- Shingrix  Completed   HPV VACCINES  Aged Out   COLONOSCOPY (Pts 45-59yrs Insurance coverage will need to be confirmed)  Discontinued     Discussed health benefits of physical activity, and encouraged her to engage in regular exercise appropriate for her age and condition.    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     I, Lavon Paganini, MD, have reviewed all documentation for this visit. The documentation on 08/17/21 for the exam, diagnosis, procedures, and orders are all accurate and complete.   Keiland Pickering, [name], MD, MPH [facility]

## 2021-08-17 NOTE — Assessment & Plan Note (Signed)
Reviewed last lipid panel Not currently on a statin Recheck FLP and CMP Discussed diet and exercise  

## 2021-08-17 NOTE — Assessment & Plan Note (Signed)
Failed fosamax in the past Declines repeat DEXA at this time Continue Ca/Vit D and weightbearing exercise

## 2021-08-17 NOTE — Assessment & Plan Note (Signed)
Chronic and stable No changes to meds today

## 2021-08-17 NOTE — Assessment & Plan Note (Signed)
Well controlled Continue current medications Recheck metabolic panel F/u in 6 months  

## 2021-08-18 ENCOUNTER — Telehealth: Payer: Self-pay

## 2021-08-18 DIAGNOSIS — E875 Hyperkalemia: Secondary | ICD-10-CM

## 2021-08-18 LAB — COMPREHENSIVE METABOLIC PANEL
ALT: 11 IU/L (ref 0–32)
AST: 16 IU/L (ref 0–40)
Albumin/Globulin Ratio: 2.2 (ref 1.2–2.2)
Albumin: 4.6 g/dL (ref 3.7–4.7)
Alkaline Phosphatase: 45 IU/L (ref 44–121)
BUN/Creatinine Ratio: 19 (ref 12–28)
BUN: 13 mg/dL (ref 8–27)
Bilirubin Total: 0.6 mg/dL (ref 0.0–1.2)
CO2: 26 mmol/L (ref 20–29)
Calcium: 10.2 mg/dL (ref 8.7–10.3)
Chloride: 96 mmol/L (ref 96–106)
Creatinine, Ser: 0.69 mg/dL (ref 0.57–1.00)
Globulin, Total: 2.1 g/dL (ref 1.5–4.5)
Glucose: 98 mg/dL (ref 70–99)
Potassium: 5.3 mmol/L — ABNORMAL HIGH (ref 3.5–5.2)
Sodium: 134 mmol/L (ref 134–144)
Total Protein: 6.7 g/dL (ref 6.0–8.5)
eGFR: 88 mL/min/{1.73_m2} (ref >59)

## 2021-08-18 LAB — LIPID PANEL WITH LDL/HDL RATIO
Cholesterol, Total: 192 mg/dL (ref 100–199)
HDL: 59 mg/dL (ref >39)
LDL Chol Calc (NIH): 117 mg/dL — ABNORMAL HIGH (ref 0–99)
LDL/HDL Ratio: 2 ratio (ref 0.0–3.2)
Triglycerides: 88 mg/dL (ref 0–149)
VLDL Cholesterol Cal: 16 mg/dL (ref 5–40)

## 2021-08-18 NOTE — Telephone Encounter (Signed)
-----   Message from Virginia Crews, MD sent at 08/18/2021  8:34 AM EST ----- Normal/stable labs.  Potassium is slightly elevated.  This may be a collection error.  Hydrate well and recheck in 2 to 4 weeks.

## 2021-08-18 NOTE — Telephone Encounter (Signed)
Patient aware.

## 2021-09-07 ENCOUNTER — Other Ambulatory Visit: Payer: Self-pay | Admitting: Internal Medicine

## 2021-09-13 DIAGNOSIS — H43813 Vitreous degeneration, bilateral: Secondary | ICD-10-CM | POA: Diagnosis not present

## 2021-09-26 ENCOUNTER — Ambulatory Visit
Admission: RE | Admit: 2021-09-26 | Discharge: 2021-09-26 | Disposition: A | Discharge status: Home / Self Care | Payer: Medicare Other | Source: Ambulatory Visit | Attending: Nurse Practitioner | Admitting: Nurse Practitioner

## 2021-09-26 ENCOUNTER — Other Ambulatory Visit: Payer: Self-pay

## 2021-09-26 ENCOUNTER — Telehealth (INDEPENDENT_AMBULATORY_CARE_PROVIDER_SITE_OTHER): Payer: Medicare Other | Admitting: Nurse Practitioner

## 2021-09-26 ENCOUNTER — Ambulatory Visit
Admission: RE | Admit: 2021-09-26 | Discharge: 2021-09-26 | Disposition: A | Discharge status: Home / Self Care | Payer: Medicare Other | Attending: Nurse Practitioner | Admitting: Nurse Practitioner

## 2021-09-26 ENCOUNTER — Encounter: Payer: Self-pay | Admitting: Nurse Practitioner

## 2021-09-26 ENCOUNTER — Telehealth: Payer: Self-pay

## 2021-09-26 VITALS — Ht 62.0 in | Wt 130.0 lb

## 2021-09-26 DIAGNOSIS — R051 Acute cough: Secondary | ICD-10-CM | POA: Diagnosis not present

## 2021-09-26 DIAGNOSIS — R062 Wheezing: Secondary | ICD-10-CM | POA: Insufficient documentation

## 2021-09-26 DIAGNOSIS — J439 Emphysema, unspecified: Secondary | ICD-10-CM | POA: Diagnosis not present

## 2021-09-26 DIAGNOSIS — J018 Other acute sinusitis: Secondary | ICD-10-CM | POA: Diagnosis not present

## 2021-09-26 MED ORDER — BENZONATATE 100 MG PO CAPS: 100.0000 mg | ORAL_CAPSULE | Freq: Two times a day (BID) | ORAL | 0 refills | Status: DC | PRN

## 2021-09-26 MED ORDER — PREDNISONE 10 MG PO TABS: ORAL_TABLET | ORAL | 0 refills | Status: DC

## 2021-09-26 MED ORDER — LEVOFLOXACIN 750 MG PO TABS: 750.0000 mg | ORAL_TABLET | Freq: Every day | ORAL | 0 refills | Status: DC

## 2021-09-26 NOTE — Telephone Encounter (Signed)
Pt notified for chest xray result we will order another chest xray repeat after 4 weeks

## 2021-09-26 NOTE — Progress Notes (Signed)
Pneumonia in the bases of the lungs worse on the left side indicated by chest xray result. Patient was already prescribed levofloxacin for 7 days, please call patient and let her know the results and that she needs to take the entire course of antibiotic. Will repeat chest xray in a few weeks to assess for improvement.

## 2021-09-26 NOTE — Progress Notes (Signed)
[facility] [place], [place] [address]  Internal MEDICINE  Telephone Visit  Patient Name: Karen Henson  [id number]  [id number]  Date of Service: 09/26/2021  I connected with the patient at 9:25 AM by telephone and verified the patients identity using two identifiers.   I discussed the limitations, risks, security and privacy concerns of performing an evaluation and management service by telephone and the availability of in person appointments. I also discussed with the patient that there may be a patient responsible charge related to the service.  The patient expressed understanding and agrees to proceed.    Chief Complaint  Patient presents with   Telephone Assessment    Telephone  covid test is negative    Telephone Screen   Sinusitis   Cough   Wheezing    HPI Dannika presents for a telehealth virtual visit for coughing and wheezing. Her home covid test was negative. She reports SOB, chest tightness, headaches, fatigue, body aches, sore throat, sinus drainage and nasal congestion.   Current Medication: Outpatient Encounter Medications as of 09/26/2021  Medication Sig   acetaminophen (TYLENOL) 500 MG tablet Take 500 mg by mouth every 6 (six) hours as needed for moderate pain or headache.   albuterol (VENTOLIN HFA) 108 (90 Base) MCG/ACT inhaler Inhale 2 puffs into the lungs every 6 (six) hours as needed for wheezing or shortness of breath.   benzonatate (TESSALON) 100 MG capsule Take 1 capsule (100 mg total) by mouth 2 (two) times daily as needed for cough.   dextromethorphan-guaiFENesin (MUCINEX DM) 30-600 MG 12hr tablet Take 1 tablet by mouth daily.   levofloxacin (LEVAQUIN) 750 MG tablet Take 1 tablet (750 mg total) by mouth daily.   loratadine (CLARITIN) 10 MG tablet Take 10 mg by mouth daily.    losartan-hydrochlorothiazide (HYZAAR) 50-12.5 MG tablet TAKE 1 TABLET BY MOUTH ONCE DAILY   montelukast (SINGULAIR) 10 MG tablet TAKE 1 TABLET BY MOUTH  BEDTIME FOR ASTHMA   predniSONE (DELTASONE) 10 MG tablet Take one tab 3 x day for 3 days, then take one tab 2 x a day for 3 days and then take one tab a day for 3 days for copd   SYMBICORT 80-4.5 MCG/ACT inhaler INHALE 2 PUFFS INTO LUNGS TWICE DAILY. RINSE MOUTH AFTER USE (Patient taking differently: as needed.)   No facility-administered encounter medications on file as of 09/26/2021.    Surgical History: Past Surgical History:  Procedure Laterality Date   BREAST EXCISIONAL BIOPSY Left 1996   neg   BREAST SURGERY     CAPSULOTOMY     CATARACT EXTRACTION W/PHACO Left 10/31/2017   Procedure: CATARACT EXTRACTION PHACO AND INTRAOCULAR LENS PLACEMENT ([place]);  Surgeon: Eulogio Bear, MD;  Location: [facility] ORS;  Service: Ophthalmology;  Laterality: Left;  [place] 00:43.7 AP% 19.6 CDE 8.57 FLUID PACK LOT # [id number]   CATARACT EXTRACTION W/PHACO Right 12/05/2017   Procedure: CATARACT EXTRACTION PHACO AND INTRAOCULAR LENS PLACEMENT (IOC);  Surgeon: Eulogio Bear, MD;  Location: [facility] ORS;  Service: Ophthalmology;  Laterality: Right;  fluid pack lot #[id number]  exp 07/01/2019 [place]    00:31.4 AP%    8.8 CDE    2.79   COLONOSCOPY  02/15/2006   COLONOSCOPY WITH PROPOFOL N/A 03/21/2016   Procedure: COLONOSCOPY WITH PROPOFOL;  Surgeon: Robert Bellow, MD;  Location: [facility] ENDOSCOPY;  Service: Endoscopy;  Laterality: N/A;   COLONOSCOPY WITH PROPOFOL N/A 03/29/2021   Procedure: COLONOSCOPY WITH PROPOFOL;  Surgeon: Robert Bellow, MD;  Location: [facility] ENDOSCOPY;  Service: Endoscopy;  Laterality: N/A;   DILATION AND CURETTAGE OF UTERUS     EYE SURGERY     VARICOSE VEIN SURGERY      Medical History: Past Medical History:  Diagnosis Date   Allergy    Environmental Allergies   Arthritis    Bronchitis, chronic (HCC)    COPD (chronic obstructive pulmonary disease) (HCC)    Cough    CHRONIC   Dyspnea    OCCAS   Hypertension    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[place])    Osteoporosis     Family History: Family  History  Problem Relation Age of Onset   Hypertension Mother    Emphysema Father    Stroke Sister    Stroke Brother    Diabetes Maternal Aunt    Diabetes Maternal Grandmother    Lung cancer Grandchild    Breast cancer Neg Hx    Ovarian cancer Neg Hx    Colon cancer Neg Hx     Social History   Socioeconomic History   Marital status: Widowed    Spouse name: Not on file   Number of children: 2   Years of education: Not on file   Highest education level: High school graduate  Occupational History   Occupation: retired  Tobacco Use   Smoking status: Former    Packs/day: 0.25    Types: Cigarettes   Smokeless tobacco: Never   Tobacco comments:    smoked a pack a week, quit about 30 years ago  Vaping Use   Vaping Use: Never used  Substance and Sexual Activity   Alcohol use: Yes    Comment: ocassional- monthly   Drug use: No   Sexual activity: Never  Other Topics Concern   Not on file  Social History Narrative   Not on file   Social Determinants of Health   Financial Resource Strain: Not on file  Food Insecurity: Not on file  Transportation Needs: Not on file  Physical Activity: Not on file  Stress: Not on file  Social Connections: Not on file  Intimate Partner Violence: Not on file      Review of Systems  Constitutional:  Positive for fatigue and fever. Negative for chills.  HENT:  Positive for congestion, postnasal drip, rhinorrhea and sore throat. Negative for ear pain, sinus pressure, sinus pain and sneezing.   Respiratory:  Positive for cough, chest tightness, shortness of breath and wheezing.   Cardiovascular: Negative.  Negative for chest pain and palpitations.  Gastrointestinal:  Negative for abdominal pain, constipation, diarrhea, nausea and vomiting.  Musculoskeletal:  Positive for myalgias.  Skin:  Negative for rash.  Neurological:  Positive for headaches. Negative for dizziness and light-headedness.   Vital Signs: Ht 5\' 2"  (1.575 m)    Wt 130 lb  (59 kg)    BMI 23.78 kg/m    Observation/Objective: She is alert and oriented and engages in conversation appropriately. She does not sound like she is in any acute distress over telephone call.     Assessment/Plan: 1. Acute non-recurrent sinusitis of other sinus Empiric antibiotic treatment prescribed to treat probable sinusitis and possible pneumonia.  - predniSONE (DELTASONE) 10 MG tablet; Take one tab 3 x day for 3 days, then take one tab 2 x a day for 3 days and then take one tab a day for 3 days for copd  Dispense: 18 tablet; Refill: 0 - levofloxacin (LEVAQUIN) 750 MG tablet; Take 1 tablet (750 mg total) by mouth daily.  Dispense: 7 tablet; Refill: 0  2. Acute cough Medication prescribed for symptomatic treatment of cough - benzonatate (TESSALON) 100 MG capsule; Take 1 capsule (100 mg total) by mouth 2 (two) times daily as needed for cough.  Dispense: 30 capsule; Refill: 0  3. Wheezing Prednisone taper prescribed for wheezing. Chest xray ordered to rule out pneumonia - predniSONE (DELTASONE) 10 MG tablet; Take one tab 3 x day for 3 days, then take one tab 2 x a day for 3 days and then take one tab a day for 3 days for copd  Dispense: 18 tablet; Refill: 0 - DG Chest 2 View; Future   General Counseling: Miyana verbalizes understanding of the findings of today's phone visit and agrees with plan of treatment. I have discussed any further diagnostic evaluation that may be needed or ordered today. We also reviewed her medications today. she has been encouraged to call the office with any questions or concerns that should arise related to todays visit.  Return if symptoms worsen or fail to improve.   Orders Placed This Encounter  Procedures   DG Chest 2 View    Meds ordered this encounter  Medications   predniSONE (DELTASONE) 10 MG tablet    Sig: Take one tab 3 x day for 3 days, then take one tab 2 x a day for 3 days and then take one tab a day for 3 days for copd    Dispense:   18 tablet    Refill:  0   levofloxacin (LEVAQUIN) 750 MG tablet    Sig: Take 1 tablet (750 mg total) by mouth daily.    Dispense:  7 tablet    Refill:  0   benzonatate (TESSALON) 100 MG capsule    Sig: Take 1 capsule (100 mg total) by mouth 2 (two) times daily as needed for cough.    Dispense:  30 capsule    Refill:  0    Time spent: 10 Minutes Time spent with patient included reviewing progress notes, labs, imaging studies, and discussing plan for follow up.  [place] Controlled Substance Database was reviewed by me for overdose risk score (ORS) if appropriate.  This patient was seen by Jonetta Osgood, FNP-C in collaboration with Dr. Clayborn Bigness as a part of collaborative care agreement.  Peggy Loge R. Valetta Fuller, MSN, FNP-C Internal medicine

## 2021-09-26 NOTE — Telephone Encounter (Signed)
-----   Message from Jonetta Osgood, NP sent at 09/26/2021  1:31 PM EST ----- Pneumonia in the bases of the lungs worse on the left side indicated by chest xray result. Patient was already prescribed levofloxacin for 7 days, please call patient and let her know the results and that she needs to take the entire course of antibi otic. Will repeat chest xray in a few weeks to assess for improvement.

## 2021-10-23 ENCOUNTER — Other Ambulatory Visit: Payer: Self-pay

## 2021-10-23 ENCOUNTER — Other Ambulatory Visit: Payer: Self-pay | Admitting: Nurse Practitioner

## 2021-10-23 ENCOUNTER — Ambulatory Visit
Admission: RE | Admit: 2021-10-23 | Discharge: 2021-10-23 | Disposition: A | Discharge status: Home / Self Care | Payer: Medicare Other | Attending: Nurse Practitioner | Admitting: Nurse Practitioner

## 2021-10-23 ENCOUNTER — Ambulatory Visit
Admission: RE | Admit: 2021-10-23 | Discharge: 2021-10-23 | Disposition: A | Discharge status: Home / Self Care | Payer: Medicare Other | Source: Ambulatory Visit | Attending: Nurse Practitioner | Admitting: Nurse Practitioner

## 2021-10-23 ENCOUNTER — Telehealth: Payer: Self-pay

## 2021-10-23 DIAGNOSIS — R051 Acute cough: Secondary | ICD-10-CM | POA: Diagnosis not present

## 2021-10-23 DIAGNOSIS — R062 Wheezing: Secondary | ICD-10-CM | POA: Diagnosis not present

## 2021-10-23 DIAGNOSIS — R059 Cough, unspecified: Secondary | ICD-10-CM | POA: Diagnosis not present

## 2021-10-23 NOTE — Progress Notes (Signed)
Chest xraty ordered to reassess for resolution of pneumonia.

## 2021-10-23 NOTE — Telephone Encounter (Signed)
Ready to close

## 2021-10-24 ENCOUNTER — Ambulatory Visit (INDEPENDENT_AMBULATORY_CARE_PROVIDER_SITE_OTHER): Payer: Medicare Other | Admitting: Nurse Practitioner

## 2021-10-24 ENCOUNTER — Encounter: Payer: Self-pay | Admitting: Nurse Practitioner

## 2021-10-24 VITALS — BP 140/90 | HR 69 | Temp 98.3°F | Resp 16 | Ht 62.0 in | Wt 132.0 lb

## 2021-10-24 DIAGNOSIS — R051 Acute cough: Secondary | ICD-10-CM

## 2021-10-24 DIAGNOSIS — J449 Chronic obstructive pulmonary disease, unspecified: Secondary | ICD-10-CM | POA: Diagnosis not present

## 2021-10-24 DIAGNOSIS — R062 Wheezing: Secondary | ICD-10-CM | POA: Diagnosis not present

## 2021-10-24 MED ORDER — BUDESONIDE-FORMOTEROL FUMARATE 80-4.5 MCG/ACT IN AERO: 2.0000 | INHALATION_SPRAY | Freq: Two times a day (BID) | RESPIRATORY_TRACT | 3 refills | Status: DC

## 2021-10-24 MED ORDER — BENZONATATE 200 MG PO CAPS: 200.0000 mg | ORAL_CAPSULE | Freq: Two times a day (BID) | ORAL | 0 refills | Status: DC | PRN

## 2021-10-24 MED ORDER — HYDROCOD POLI-CHLORPHE POLI ER 10-8 MG/5ML PO SUER: 5.0000 mL | Freq: Two times a day (BID) | ORAL | 0 refills | Status: DC | PRN

## 2021-10-24 NOTE — Progress Notes (Signed)
[facility] [place], [place] [address]  Internal MEDICINE  Office Visit Note  Patient Name: Karen Henson  [id number]  [id number]  Date of Service: 10/24/2021  Chief Complaint  Patient presents with   Cough    Negative for Covid, has been coughing up clear mucus, sometimes SOB - had pneumonia 1 month ago, hasn't cleared up all the way      HPI Karen Henson presents for an acute sick visit for residual symptoms from pneumonia that was treated 1 month ago. She is negative for covid. She continues to have a residual cough with clear sputum and is sometimes SOB. She has been using her symbicort only once daily although it is supposed to be twice daily dosing. She is also asking for medication to alleviate cough.    Current Medication:  Outpatient Encounter Medications as of 10/24/2021  Medication Sig   acetaminophen (TYLENOL) 500 MG tablet Take 500 mg by mouth every 6 (six) hours as needed for moderate pain or headache.   albuterol (VENTOLIN HFA) 108 (90 Base) MCG/ACT inhaler Inhale 2 puffs into the lungs every 6 (six) hours as needed for wheezing or shortness of breath.   benzonatate (TESSALON) 200 MG capsule Take 1 capsule (200 mg total) by mouth 2 (two) times daily as needed for cough.   chlorpheniramine-HYDROcodone (TUSSIONEX PENNKINETIC ER) 10-8 MG/5ML Take 5 mLs by mouth every 12 (twelve) hours as needed for cough.   dextromethorphan-guaiFENesin (MUCINEX DM) 30-600 MG 12hr tablet Take 1 tablet by mouth daily.   loratadine (CLARITIN) 10 MG tablet Take 10 mg by mouth daily.    montelukast (SINGULAIR) 10 MG tablet TAKE 1 TABLET BY MOUTH BEDTIME FOR ASTHMA   predniSONE (DELTASONE) 10 MG tablet Take one tab 3 x day for 3 days, then take one tab 2 x a day for 3 days and then take one tab a day for 3 days for copd   [DISCONTINUED] benzonatate (TESSALON) 100 MG capsule Take 1 capsule (100 mg total) by mouth 2 (two) times daily as needed for cough.   [DISCONTINUED]  levofloxacin (LEVAQUIN) 750 MG tablet Take 1 tablet (750 mg total) by mouth daily.   [DISCONTINUED] losartan-hydrochlorothiazide (HYZAAR) 50-12.5 MG tablet TAKE 1 TABLET BY MOUTH ONCE DAILY   [DISCONTINUED] SYMBICORT 80-4.5 MCG/ACT inhaler INHALE 2 PUFFS INTO LUNGS TWICE DAILY. RINSE MOUTH AFTER USE (Patient taking differently: as needed.)   budesonide-formoterol (SYMBICORT) 80-4.5 MCG/ACT inhaler Inhale 2 puffs into the lungs in the morning and at bedtime.   No facility-administered encounter medications on file as of 10/24/2021.      Medical History: Past Medical History:  Diagnosis Date   Allergy    Environmental Allergies   Arthritis    Bronchitis, chronic (HCC)    COPD (chronic obstructive pulmonary disease) (HCC)    Cough    CHRONIC   Dyspnea    OCCAS   Hypertension    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HCC)    Osteoporosis      Vital Signs: BP 140/90    Pulse 69    Temp 98.3 F (36.8 C)    Resp 16    Ht 5\' 2"  (1.575 m)    Wt 132 lb (59.9 kg)    SpO2 93%    BMI 24.14 kg/m    Review of Systems  Constitutional:  Positive for fatigue. Negative for chills and unexpected weight change.  HENT:  Negative for congestion, rhinorrhea, sneezing and sore throat.   Eyes:  Negative for redness.  Respiratory:  Positive for cough, shortness of breath and wheezing. Negative for chest tightness.   Cardiovascular: Negative.  Negative for chest pain and palpitations.  Gastrointestinal: Negative.  Negative for abdominal pain, constipation, diarrhea, nausea and vomiting.  Genitourinary: Negative.  Negative for dysuria and frequency.  Musculoskeletal: Negative.  Negative for arthralgias, back pain, joint swelling and neck pain.  Skin:  Negative for rash.  Neurological: Negative.  Negative for tremors and numbness.  Hematological:  Negative for adenopathy. Does not bruise/bleed easily.  Psychiatric/Behavioral:  Negative for behavioral problems (Depression), sleep disturbance and suicidal ideas. The patient is  not nervous/anxious.    Physical Exam Vitals reviewed.  Constitutional:      General: She is not in acute distress.    Appearance: Normal appearance. She is normal weight. She is not ill-appearing.  HENT:     Head: Normocephalic and atraumatic.  Eyes:     Pupils: Pupils are equal, round, and reactive to light.  Cardiovascular:     Rate and Rhythm: Normal rate and regular rhythm.     Pulses: Normal pulses.     Heart sounds: Normal heart sounds. No murmur heard. Pulmonary:     Effort: Pulmonary effort is normal. No accessory muscle usage or respiratory distress.     Breath sounds: Normal air entry. Examination of the right-upper field reveals wheezing. Examination of the left-upper field reveals wheezing. Examination of the right-middle field reveals wheezing. Examination of the left-middle field reveals wheezing. Wheezing present.  Neurological:     Mental Status: She is alert and oriented to person, place, and time.     Cranial Nerves: No cranial nerve deficit.     Coordination: Coordination normal.     Gait: Gait normal.  Psychiatric:        Mood and Affect: Mood normal.        Behavior: Behavior normal.      Assessment/Plan: 1. Chronic obstructive pulmonary disease, unspecified COPD type ([place]) Discussed taking her symbicort inhaler as prescribed which is twice daily not once daily. This may improve her breathing more and decrease wheezing - budesonide-formoterol (SYMBICORT) 80-4.5 MCG/ACT inhaler; Inhale 2 puffs into the lungs in the morning and at bedtime.  Dispense: 10.2 g; Refill: 3  2. Acute cough A cough can usually linger for a few weeks after having pneumonia. Medications prescribed to help alleviate cough. - chlorpheniramine-HYDROcodone (TUSSIONEX PENNKINETIC ER) 10-8 MG/5ML; Take 5 mLs by mouth every 12 (twelve) hours as needed for cough.  Dispense: 200 mL; Refill: 0 - benzonatate (TESSALON) 200 MG capsule; Take 1 capsule (200 mg total) by mouth 2 (two) times daily as  needed for cough.  Dispense: 30 capsule; Refill: 0  3. Wheezing Residual wheezing after pneumonia infection. She was treated with antibiotics and prednisone taper.    General Counseling: Camara verbalizes understanding of the findings of todays visit and agrees with plan of treatment. I have discussed any further diagnostic evaluation that may be needed or ordered today. We also reviewed her medications today. she has been encouraged to call the office with any questions or concerns that should arise related to todays visit.    Counseling:    No orders of the defined types were placed in this encounter.   Meds ordered this encounter  Medications   chlorpheniramine-HYDROcodone (TUSSIONEX PENNKINETIC ER) 10-8 MG/5ML    Sig: Take 5 mLs by mouth every 12 (twelve) hours as needed for cough.    Dispense:  200 mL    Refill:  0  benzonatate (TESSALON) 200 MG capsule    Sig: Take 1 capsule (200 mg total) by mouth 2 (two) times daily as needed for cough.    Dispense:  30 capsule    Refill:  0   budesonide-formoterol (SYMBICORT) 80-4.5 MCG/ACT inhaler    Sig: Inhale 2 puffs into the lungs in the morning and at bedtime.    Dispense:  10.2 g    Refill:  3    Return in about 1 month (around 11/24/2021) for F/U, pulmonary only with Jillene Wehrenberg or DSK.  [place] Controlled Substance Database was reviewed by me for overdose risk score (ORS)  Time spent:30 Minutes Time spent with patient included reviewing progress notes, labs, imaging studies, and discussing plan for follow up.   This patient was seen by Jonetta Osgood, FNP-C in collaboration with Dr. Clayborn Bigness as a part of collaborative care agreement.  Shloima Clinch R. Valetta Fuller, MSN, FNP-C Internal Medicine

## 2021-11-07 ENCOUNTER — Other Ambulatory Visit: Payer: Self-pay | Admitting: Family Medicine

## 2021-11-07 NOTE — Telephone Encounter (Signed)
Requested Prescriptions  Pending Prescriptions Disp Refills   losartan-hydrochlorothiazide (HYZAAR) 50-12.5 MG tablet [Pharmacy Med Name: LOSARTAN POTASSIUM-HCTZ 50-12.5 MG] 90 tablet 1    Sig: TAKE 1 TABLET BY MOUTH ONCE DAILY     Cardiovascular: ARB + Diuretic Combos Failed - 11/07/2021  2:03 PM      Failed - K in normal range and within 180 days    Potassium  Date Value Ref Range Status  08/17/2021 5.3 (H) 3.5 - 5.2 mmol/L Final  07/16/2013 4.4 3.5 - 5.1 mmol/L Final         Failed - Last BP in normal range    BP Readings from Last 1 Encounters:  10/24/21 (!) 148/96         Passed - Na in normal range and within 180 days    Sodium  Date Value Ref Range Status  08/17/2021 134 134 - 144 mmol/L Final         Passed - Cr in normal range and within 180 days    Creat  Date Value Ref Range Status  08/05/2017 0.77 0.60 - 0.93 mg/dL Final    Comment:    For patients >72 years of age, the reference limit for Creatinine is approximately 13% higher for people identified as African-American. .    Creatinine, Ser  Date Value Ref Range Status  08/17/2021 0.69 0.57 - 1.00 mg/dL Final         Passed - eGFR is 10 or above and within 180 days    GFR, Est African American  Date Value Ref Range Status  08/05/2017 88 > OR = 60 mL/min/1.56m2 Final   GFR calc Af Amer  Date Value Ref Range Status  08/12/2020 80 >59 mL/min/1.73 Final    Comment:    **In accordance with recommendations from the NKF-ASN Task force,**   [facility] is in the process of updating its eGFR calculation to the   2021 CKD-EPI creatinine equation that estimates kidney function   without a race variable.    GFR, Est Non African American  Date Value Ref Range Status  08/05/2017 76 > OR = 60 mL/min/1.81m2 Final   GFR calc non Af Amer  Date Value Ref Range Status  08/12/2020 70 >59 mL/min/1.73 Final   eGFR  Date Value Ref Range Status  08/17/2021 88 >59 mL/min/1.73 Final         Passed - Patient is not  pregnant      Passed - Valid encounter within last 6 months    Recent Outpatient Visits          2 months ago Encounter for [employer] annual wellness exam   [facility] [place], [name], MD   9 months ago Essential hypertension   [place], [name], MD   1 year ago Acute cystitis with hematuria   [facility], [name], FNP   1 year ago Primary hypertension   [facility] [place], [name], MD   1 year ago Essential hypertension   [place], [name], MD      Future Appointments            In 3 months [place], [name], MD [facility] [place]

## 2021-11-19 ENCOUNTER — Encounter: Payer: Self-pay | Admitting: Nurse Practitioner

## 2021-11-23 ENCOUNTER — Other Ambulatory Visit: Payer: Self-pay

## 2021-11-23 ENCOUNTER — Ambulatory Visit (INDEPENDENT_AMBULATORY_CARE_PROVIDER_SITE_OTHER): Payer: Medicare Other | Admitting: Nurse Practitioner

## 2021-11-23 ENCOUNTER — Encounter: Payer: Self-pay | Admitting: Nurse Practitioner

## 2021-11-23 VITALS — BP 120/80 | HR 64 | Temp 98.6°F | Resp 16 | Ht 62.0 in | Wt 129.6 lb

## 2021-11-23 DIAGNOSIS — R051 Acute cough: Secondary | ICD-10-CM

## 2021-11-23 DIAGNOSIS — R0602 Shortness of breath: Secondary | ICD-10-CM

## 2021-11-23 DIAGNOSIS — J449 Chronic obstructive pulmonary disease, unspecified: Secondary | ICD-10-CM

## 2021-11-23 MED ORDER — BREZTRI AEROSPHERE 160-9-4.8 MCG/ACT IN AERO: 2.0000 | INHALATION_SPRAY | Freq: Two times a day (BID) | RESPIRATORY_TRACT | 11 refills | Status: DC

## 2021-11-23 NOTE — Progress Notes (Signed)
[facility] [place], [place] [address]  Internal MEDICINE  Office Visit Note  Patient Name: Karen Henson  [id number]  [id number]  Date of Service: 11/23/2021  Chief Complaint  Patient presents with   Follow-up    HPI Karen Henson presents for follow-up visit for COPD.  She was previously treated in December for pneumonia.  She is feeling much better.  She uses Symbicort for her maintenance inhaler and feels like she still short of breath.  She did her spirometry in office today.  Her FVC and her FEV1 both dropped by approximately 0.3.  This was a 10% decrease in her FVC when compared to her spirometry in 2021 and approximately 14% decrease in her FEV1 when compared to the previous spirometry.  Her overall ratio also showed a significant decrease.  She has been on Symbicort for a while and has not tried anything else recently.    Current Medication: Outpatient Encounter Medications as of 11/23/2021  Medication Sig   acetaminophen (TYLENOL) 500 MG tablet Take 500 mg by mouth every 6 (six) hours as needed for moderate pain or headache.   albuterol (VENTOLIN HFA) 108 (90 Base) MCG/ACT inhaler Inhale 2 puffs into the lungs every 6 (six) hours as needed for wheezing or shortness of breath.   benzonatate (TESSALON) 200 MG capsule Take 1 capsule (200 mg total) by mouth 2 (two) times daily as needed for cough.   Budeson-Glycopyrrol-Formoterol (BREZTRI AEROSPHERE) 160-9-4.8 MCG/ACT AERO Inhale 2 puffs into the lungs 2 (two) times daily.   chlorpheniramine-HYDROcodone (TUSSIONEX PENNKINETIC ER) 10-8 MG/5ML Take 5 mLs by mouth every 12 (twelve) hours as needed for cough.   dextromethorphan-guaiFENesin (MUCINEX DM) 30-600 MG 12hr tablet Take 1 tablet by mouth daily.   loratadine (CLARITIN) 10 MG tablet Take 10 mg by mouth daily.    losartan-hydrochlorothiazide (HYZAAR) 50-12.5 MG tablet TAKE 1 TABLET BY MOUTH ONCE DAILY   montelukast (SINGULAIR) 10 MG tablet TAKE 1 TABLET BY  MOUTH BEDTIME FOR ASTHMA   predniSONE (DELTASONE) 10 MG tablet Take one tab 3 x day for 3 days, then take one tab 2 x a day for 3 days and then take one tab a day for 3 days for copd   [DISCONTINUED] budesonide-formoterol (SYMBICORT) 80-4.5 MCG/ACT inhaler Inhale 2 puffs into the lungs in the morning and at bedtime.   No facility-administered encounter medications on file as of 11/23/2021.    Surgical History: Past Surgical History:  Procedure Laterality Date   BREAST EXCISIONAL BIOPSY Left 1996   neg   BREAST SURGERY     CAPSULOTOMY     CATARACT EXTRACTION W/PHACO Left 10/31/2017   Procedure: CATARACT EXTRACTION PHACO AND INTRAOCULAR LENS PLACEMENT ([place]);  Surgeon: Eulogio Bear, MD;  Location: [facility] ORS;  Service: Ophthalmology;  Laterality: Left;  [place] 00:43.7 AP% 19.6 CDE 8.57 FLUID PACK LOT # [id number]   CATARACT EXTRACTION W/PHACO Right 12/05/2017   Procedure: CATARACT EXTRACTION PHACO AND INTRAOCULAR LENS PLACEMENT ([facility]);  Surgeon: Eulogio Bear, MD;  Location: [facility] ORS;  Service: Ophthalmology;  Laterality: Right;  fluid pack lot #[id number]  exp 07/01/2019 [place]    00:31.4 AP%    8.8 CDE    2.79   COLONOSCOPY  02/15/2006   COLONOSCOPY WITH PROPOFOL N/A 03/21/2016   Procedure: COLONOSCOPY WITH PROPOFOL;  Surgeon: Robert Bellow, MD;  Location: [facility] ENDOSCOPY;  Service: Endoscopy;  Laterality: N/A;   COLONOSCOPY WITH PROPOFOL N/A 03/29/2021   Procedure: COLONOSCOPY WITH PROPOFOL;  Surgeon: Hervey Ard  W, MD;  Location: [facility] ENDOSCOPY;  Service: Endoscopy;  Laterality: N/A;   DILATION AND CURETTAGE OF UTERUS     EYE SURGERY     VARICOSE VEIN SURGERY      Medical History: Past Medical History:  Diagnosis Date   Allergy    Environmental Allergies   Arthritis    Bronchitis, chronic (HCC)    COPD (chronic obstructive pulmonary disease) (HCC)    Cough    CHRONIC   Dyspnea    OCCAS   Hypertension    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[place])    Osteoporosis     Family  History: Family History  Problem Relation Age of Onset   Hypertension Mother    Emphysema Father    Stroke Sister    Stroke Brother    Diabetes Maternal Aunt    Diabetes Maternal Grandmother    Lung cancer Grandchild    Breast cancer Neg Hx    Ovarian cancer Neg Hx    Colon cancer Neg Hx     Social History   Socioeconomic History   Marital status: Widowed    Spouse name: Not on file   Number of children: 2   Years of education: Not on file   Highest education level: High school graduate  Occupational History   Occupation: retired  Tobacco Use   Smoking status: Former    Packs/day: 0.25    Types: Cigarettes   Smokeless tobacco: Never   Tobacco comments:    smoked a pack a week, quit about 30 years ago  Vaping Use   Vaping Use: Never used  Substance and Sexual Activity   Alcohol use: Yes    Comment: ocassional- monthly   Drug use: No   Sexual activity: Never  Other Topics Concern   Not on file  Social History Narrative   Not on file   Social Determinants of Health   Financial Resource Strain: Not on file  Food Insecurity: Not on file  Transportation Needs: Not on file  Physical Activity: Not on file  Stress: Not on file  Social Connections: Not on file  Intimate Partner Violence: Not on file      Review of Systems  Constitutional: Negative.   HENT: Negative.    Respiratory:  Negative for cough, chest tightness, shortness of breath and wheezing.   Cardiovascular: Negative.  Negative for chest pain and palpitations.  Gastrointestinal: Negative.  Negative for abdominal pain, constipation, diarrhea, nausea and vomiting.  Musculoskeletal: Negative.   Skin: Negative.    Vital Signs: BP 120/80 Comment: 154/98   Pulse 64    Temp 98.6 F (37 C)    Resp 16    Ht 5\' 2"  (1.575 m)    Wt 129 lb 9.6 oz (58.8 kg)    SpO2 98%    BMI 23.70 kg/m    Physical Exam Vitals reviewed.  Constitutional:      General: She is not in acute distress.    Appearance: Normal  appearance. She is obese. She is not ill-appearing.  HENT:     Head: Normocephalic and atraumatic.  Eyes:     Pupils: Pupils are equal, round, and reactive to light.  Cardiovascular:     Rate and Rhythm: Normal rate and regular rhythm.  Pulmonary:     Effort: Pulmonary effort is normal. No respiratory distress.  Neurological:     Mental Status: She is alert and oriented to person, place, and time.  Psychiatric:        Mood and  Affect: Mood normal.        Behavior: Behavior normal.       Assessment/Plan: 1. Chronic obstructive pulmonary disease, unspecified COPD type ([place]) Patient provided with samples of Breztri.  Prescription sent to pharmacy.  Symbicort discontinued.  If patient decides that she does not like Judithann Sauger will put the Symbicort prescription back in.  Follow-up in 1 month to discuss effectiveness of new maintenance inhaler. - Budeson-Glycopyrrol-Formoterol (BREZTRI AEROSPHERE) 160-9-4.8 MCG/ACT AERO; Inhale 2 puffs into the lungs 2 (two) times daily.  Dispense: 10.7 g; Refill: 11  2. SOB (shortness of breath) Patient continues to have shortness of breath, spirometry done in office today.  Overall spirometry has worsened when compared to last in office spirometry.  Trying a triple therapy maintenance inhaler to see if this will improve her breathing more. - Spirometry with Graph  3. Acute cough Continues to have a lingering cough after being treated for pneumonia.  Still has Tessalon Perles at home and takes them as needed.  No further intervention needed at this time.   General Counseling: Karen Henson verbalizes understanding of the findings of todays visit and agrees with plan of treatment. I have discussed any further diagnostic evaluation that may be needed or ordered today. We also reviewed her medications today. she has been encouraged to call the office with any questions or concerns that should arise related to todays visit.    Orders Placed This Encounter   Procedures   Spirometry with Graph    Meds ordered this encounter  Medications   Budeson-Glycopyrrol-Formoterol (BREZTRI AEROSPHERE) 160-9-4.8 MCG/ACT AERO    Sig: Inhale 2 puffs into the lungs 2 (two) times daily.    Dispense:  10.7 g    Refill:  11    Return in about 1 month (around 12/21/2021) for F/U, eval new med, pulmonary only, Kellan Raffield PCP.   Total time spent:30 Minutes Time spent includes review of chart, medications, test results, and follow up plan with the patient.   [place] Controlled Substance Database was reviewed by me.  This patient was seen by Jonetta Osgood, FNP-C in collaboration with Dr. Clayborn Bigness as a part of collaborative care agreement.   Irene Collings R. Valetta Fuller, MSN, FNP-C Internal medicine

## 2021-12-05 ENCOUNTER — Other Ambulatory Visit: Payer: Self-pay | Admitting: Internal Medicine

## 2021-12-20 ENCOUNTER — Other Ambulatory Visit: Payer: Self-pay

## 2021-12-20 ENCOUNTER — Encounter: Payer: Self-pay | Admitting: Nurse Practitioner

## 2021-12-20 ENCOUNTER — Ambulatory Visit (INDEPENDENT_AMBULATORY_CARE_PROVIDER_SITE_OTHER): Payer: Medicare Other | Admitting: Nurse Practitioner

## 2021-12-20 VITALS — BP 125/90 | HR 72 | Temp 98.3°F | Resp 16 | Ht 62.0 in | Wt 130.4 lb

## 2021-12-20 DIAGNOSIS — R0602 Shortness of breath: Secondary | ICD-10-CM

## 2021-12-20 DIAGNOSIS — J449 Chronic obstructive pulmonary disease, unspecified: Secondary | ICD-10-CM

## 2021-12-20 NOTE — Progress Notes (Signed)
[facility] ?[address] ?[place], [place] [address] ? ?Internal MEDICINE  ?Office Visit Note ? ?Patient Name: Karen Henson ? [id number]  ?[id number] ? ?Date of Service: 12/20/2021 ? ?Chief Complaint  ?Patient presents with  ? Follow-up  ? COPD  ? ? ?HPI ?Karen Henson presents for follow-up visit for COPD.  At her previous office visit she was provided with samples and prescribed Breztri as a maintenance inhaler.  She reports that she feels like the new inhaler is helping her breathe better and denies any shortness of breath or wheezing.  She reports that the [facility] inhaler is cheaper than the Symbicort was when she was taking that. ? ? ?Current Medication: ?Outpatient Encounter Medications as of 12/20/2021  ?Medication Sig  ? acetaminophen (TYLENOL) 500 MG tablet Take 500 mg by mouth every 6 (six) hours as needed for moderate pain or headache.  ? albuterol (VENTOLIN HFA) 108 (90 Base) MCG/ACT inhaler Inhale 2 puffs into the lungs every 6 (six) hours as needed for wheezing or shortness of breath.  ? benzonatate (TESSALON) 200 MG capsule Take 1 capsule (200 mg total) by mouth 2 (two) times daily as needed for cough.  ? Budeson-Glycopyrrol-Formoterol (BREZTRI AEROSPHERE) 160-9-4.8 MCG/ACT AERO Inhale 2 puffs into the lungs 2 (two) times daily.  ? chlorpheniramine-HYDROcodone (TUSSIONEX PENNKINETIC ER) 10-8 MG/5ML Take 5 mLs by mouth every 12 (twelve) hours as needed for cough.  ? dextromethorphan-guaiFENesin (MUCINEX DM) 30-600 MG 12hr tablet Take 1 tablet by mouth daily.  ? loratadine (CLARITIN) 10 MG tablet Take 10 mg by mouth daily.   ? losartan-hydrochlorothiazide (HYZAAR) 50-12.5 MG tablet TAKE 1 TABLET BY MOUTH ONCE DAILY  ? montelukast (SINGULAIR) 10 MG tablet TAKE 1 TABLET BY MOUTH BEDTIME FOR ASTHMA  ? predniSONE (DELTASONE) 10 MG tablet Take one tab 3 x day for 3 days, then take one tab 2 x a day for 3 days and then take one tab a day for 3 days for copd  ? ?No facility-administered encounter  medications on file as of 12/20/2021.  ? ? ?Surgical History: ?Past Surgical History:  ?Procedure Laterality Date  ? BREAST EXCISIONAL BIOPSY Left 1996  ? neg  ? BREAST SURGERY    ? CAPSULOTOMY    ? CATARACT EXTRACTION W/PHACO Left 10/31/2017  ? Procedure: CATARACT EXTRACTION PHACO AND INTRAOCULAR LENS PLACEMENT ([facility]);  Surgeon: Eulogio Bear, MD;  Location: [facility] ORS;  Service: Ophthalmology;  Laterality: Left;  [place] 00:43.7 ?AP% 19.6 ?CDE 8.57 ?FLUID PACK LOT # [id number]  ? CATARACT EXTRACTION W/PHACO Right 12/05/2017  ? Procedure: CATARACT EXTRACTION PHACO AND INTRAOCULAR LENS PLACEMENT ([facility]);  Surgeon: Eulogio Bear, MD;  Location: [facility] ORS;  Service: Ophthalmology;  Laterality: Right;  fluid pack lot #[id number] H  exp 07/01/2019 ?[place]    00:31.4 ?AP%    8.8 ?CDE    2.79  ? COLONOSCOPY  02/15/2006  ? COLONOSCOPY WITH PROPOFOL N/A 03/21/2016  ? Procedure: COLONOSCOPY WITH PROPOFOL;  Surgeon: Robert Bellow, MD;  Location: [facility] ENDOSCOPY;  Service: Endoscopy;  Laterality: N/A;  ? COLONOSCOPY WITH PROPOFOL N/A 03/29/2021  ? Procedure: COLONOSCOPY WITH PROPOFOL;  Surgeon: Robert Bellow, MD;  Location: [facility] ENDOSCOPY;  Service: Endoscopy;  Laterality: N/A;  ? DILATION AND CURETTAGE OF UTERUS    ? EYE SURGERY    ? VARICOSE VEIN SURGERY    ? ? ?Medical History: ?Past Medical History:  ?Diagnosis Date  ? Allergy   ? Environmental Allergies  ? Arthritis   ? Bronchitis, chronic ([place])   ?  COPD (chronic obstructive pulmonary disease) ([place])   ? Cough   ? CHRONIC  ? Dyspnea   ? OCCAS  ? Hypertension   ? 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 Osteoporosis   ? ? ?Family History: ?Family History  ?Problem Relation Age of Onset  ? Hypertension Mother   ? Emphysema Father   ? Stroke Sister   ? Stroke Brother   ? Diabetes Maternal Aunt   ? Diabetes Maternal Grandmother   ? Lung cancer Grandchild   ? Breast cancer Neg Hx   ? Ovarian cancer Neg Hx   ? Colon cancer Neg Hx   ? ? ?Social History  ? ?Socioeconomic History  ? Marital status:  Widowed  ?  Spouse name: Not on file  ? Number of children: 2  ? Years of education: Not on file  ? Highest education level: High school graduate  ?Occupational History  ? Occupation: retired  ?Tobacco Use  ? Smoking status: Former  ?  Packs/day: 0.25  ?  Types: Cigarettes  ? Smokeless tobacco: Never  ? Tobacco comments:  ?  smoked a pack a week, quit about 30 years ago  ?Vaping Use  ? Vaping Use: Never used  ?Substance and Sexual Activity  ? Alcohol use: Yes  ?  Comment: ocassional- monthly  ? Drug use: No  ? Sexual activity: Never  ?Other Topics Concern  ? Not on file  ?Social History Narrative  ? Not on file  ? ?Social Determinants of Health  ? ?Financial Resource Strain: Not on file  ?Food Insecurity: Not on file  ?Transportation Needs: Not on file  ?Physical Activity: Not on file  ?Stress: Not on file  ?Social Connections: Not on file  ?Intimate Partner Violence: Not on file  ? ? ? ? ?Review of Systems  ?Constitutional:  Negative for chills, fatigue and unexpected weight change.  ?HENT:  Negative for congestion, rhinorrhea, sneezing and sore throat.   ?Eyes:  Negative for redness.  ?Respiratory: Negative.  Negative for cough, chest tightness, shortness of breath and wheezing.   ?Cardiovascular:  Negative for chest pain and palpitations.  ?Gastrointestinal:  Negative for abdominal pain, constipation, diarrhea, nausea and vomiting.  ?Genitourinary:  Negative for dysuria and frequency.  ?Musculoskeletal:  Negative for arthralgias, back pain, joint swelling and neck pain.  ?Skin:  Negative for rash.  ?Neurological: Negative.  Negative for tremors and numbness.  ?Hematological:  Negative for adenopathy. Does not bruise/bleed easily.  ?Psychiatric/Behavioral:  Negative for behavioral problems (Depression), sleep disturbance and suicidal ideas. The patient is not nervous/anxious.   ? ?Vital Signs: ?BP (!) 135/97   Pulse 72   Temp 98.3 ?F (36.8 ?C)   Resp 16   Ht '5\' 2"'$  (1.575 m)   Wt 130 lb 6.4 oz (59.1 kg)    SpO2 97%   BMI 23.85 kg/m?  ? ? ?Physical Exam ?Vitals reviewed.  ?Constitutional:   ?   General: She is not in acute distress. ?   Appearance: Normal appearance. She is normal weight. She is not ill-appearing.  ?HENT:  ?   Head: Normocephalic and atraumatic.  ?Eyes:  ?   Pupils: Pupils are equal, round, and reactive to light.  ?Cardiovascular:  ?   Rate and Rhythm: Normal rate and regular rhythm.  ?   Heart sounds: Normal heart sounds. No murmur heard. ?Pulmonary:  ?   Effort: Pulmonary effort is normal. No respiratory distress.  ?   Breath sounds: Normal breath sounds. No wheezing.  ?Neurological:  ?  Mental Status: She is alert and oriented to person, place, and time.  ?Psychiatric:     ?   Mood and Affect: Mood normal.     ?   Behavior: Behavior normal.  ? ? ? ? ? ?Assessment/Plan: ?1. Chronic obstructive pulmonary disease, unspecified COPD type ([place]) ?Breztri seems to be helping.  Continue as prescribed ? ?2. SOB (shortness of breath) ?Patient has not used her rescue inhaler as much, may continue to use if needed according to prescription ? ? ?General Counseling: Kianah verbalizes understanding of the findings of todays visit and agrees with plan of treatment. I have discussed any further diagnostic evaluation that may be needed or ordered today. We also reviewed her medications today. she has been encouraged to call the office with any questions or concerns that should arise related to todays visit. ? ? ? ?No orders of the defined types were placed in this encounter. ? ? ?No orders of the defined types were placed in this encounter. ? ? ?Return in about 6 months (around 06/22/2022) for F/U, pulmonary only with DSK or Aidric Endicott. ? ? ?Total time spent:20 Minutes ?Time spent includes review of chart, medications, test results, and follow up plan with the patient.  ? ?[place] Controlled Substance Database was reviewed by me. ? ?This patient was seen by Jonetta Osgood, FNP-C in collaboration with Dr. Clayborn Bigness as a  part of collaborative care agreement. ? ? ?Tavaughn Silguero R. Valetta Fuller, MSN, FNP-C ?Internal medicine  ?

## 2022-01-17 ENCOUNTER — Ambulatory Visit: Payer: Medicare Other | Admitting: Family Medicine

## 2022-02-07 ENCOUNTER — Other Ambulatory Visit: Payer: Self-pay | Admitting: Family Medicine

## 2022-02-13 NOTE — Progress Notes (Signed)
Established patient visit   Patient: Karen Henson   DOB: Apr 02, 1941   81 y.o. Female  MRN: [id number] Visit Date: 02/15/2022  Today's healthcare provider: Lavon Paganini, MD   Beverlee Nims as a scribe for Lavon Paganini, MD.,have documented all relevant documentation on the behalf of Lavon Paganini, MD,as directed by  Lavon Paganini, MD while in the presence of Lavon Paganini, MD.  Chief Complaint  Patient presents with   Hypertension   Subjective    HPI  Hypertension, follow-up  BP Readings from Last 3 Encounters:  02/15/22 123/82  12/20/21 125/90  11/23/21 120/80   Wt Readings from Last 3 Encounters:  02/15/22 127 lb 9.6 oz (57.9 kg)  12/20/21 130 lb 6.4 oz (59.1 kg)  11/23/21 129 lb 9.6 oz (58.8 kg)     She was last seen for hypertension 6 months ago.  BP at that visit was 140/90. Management since that visit includes no changes.  She reports excellent compliance with treatment. She is not having side effects.  She is following a Regular diet. She is exercising, including walking almost every day. She does not smoke.  Use of agents associated with hypertension: none.   Outside blood pressures are not checked  Symptoms: No chest pain No chest pressure  No palpitations No syncope  No dyspnea No orthopnea  No paroxysmal nocturnal dyspnea No lower extremity edema   Pertinent labs Lab Results  Component Value Date   CHOL 192 08/17/2021   HDL 59 08/17/2021   LDLCALC 117 (H) 08/17/2021   TRIG 88 08/17/2021   CHOLHDL 3.6 08/12/2020   Lab Results  Component Value Date   NA 134 08/17/2021   K 5.3 (H) 08/17/2021   CREATININE 0.69 08/17/2021   EGFR 88 08/17/2021   GLUCOSE 98 08/17/2021   TSH 1.760 01/30/2016     The ASCVD Risk score (Arnett DK, et al., 2019) failed to calculate for the following reasons:   The 2019 ASCVD risk score is only valid for ages 59 to  49  ---------------------------------------------------------------------------------------------------   Medications: Outpatient Medications Prior to Visit  Medication Sig   acetaminophen (TYLENOL) 500 MG tablet Take 500 mg by mouth every 6 (six) hours as needed for moderate pain or headache.   albuterol (VENTOLIN HFA) 108 (90 Base) MCG/ACT inhaler Inhale 2 puffs into the lungs every 6 (six) hours as needed for wheezing or shortness of breath.   benzonatate (TESSALON) 200 MG capsule Take 1 capsule (200 mg total) by mouth 2 (two) times daily as needed for cough.   Budeson-Glycopyrrol-Formoterol (BREZTRI AEROSPHERE) 160-9-4.8 MCG/ACT AERO Inhale 2 puffs into the lungs 2 (two) times daily.   chlorpheniramine-HYDROcodone (TUSSIONEX PENNKINETIC ER) 10-8 MG/5ML Take 5 mLs by mouth every 12 (twelve) hours as needed for cough.   dextromethorphan-guaiFENesin (MUCINEX DM) 30-600 MG 12hr tablet Take 1 tablet by mouth daily.   loratadine (CLARITIN) 10 MG tablet Take 10 mg by mouth daily.    losartan-hydrochlorothiazide (HYZAAR) 50-12.5 MG tablet TAKE 1 TABLET BY MOUTH ONCE DAILY   montelukast (SINGULAIR) 10 MG tablet TAKE 1 TABLET BY MOUTH BEDTIME FOR ASTHMA   predniSONE (DELTASONE) 10 MG tablet Take one tab 3 x day for 3 days, then take one tab 2 x a day for 3 days and then take one tab a day for 3 days for copd   No facility-administered medications prior to visit.    Review of Systems per HPI  Last lipids Lab Results  Component Value  Date   CHOL 192 08/17/2021   HDL 59 08/17/2021   LDLCALC 117 (H) 08/17/2021   TRIG 88 08/17/2021   CHOLHDL 3.6 08/12/2020       Objective    BP 123/82 (BP Location: Left Arm, Patient Position: Sitting, Cuff Size: Normal)   Pulse 67   Temp 98.4 F (36.9 C) (Oral)   Resp 16   Ht $R'5\' 2"'Lf$  (1.575 m)   Wt 127 lb 9.6 oz (57.9 kg)   SpO2 99%   BMI 23.34 kg/m  BP Readings from Last 3 Encounters:  02/15/22 123/82  12/20/21 125/90  11/23/21 120/80   Wt  Readings from Last 3 Encounters:  02/15/22 127 lb 9.6 oz (57.9 kg)  12/20/21 130 lb 6.4 oz (59.1 kg)  11/23/21 129 lb 9.6 oz (58.8 kg)      Physical Exam Vitals reviewed.  Constitutional:      General: She is not in acute distress.    Appearance: Normal appearance. She is well-developed. She is not diaphoretic.  HENT:     Head: Normocephalic and atraumatic.  Eyes:     General: No scleral icterus.    Conjunctiva/sclera: Conjunctivae normal.  Neck:     Thyroid: No thyromegaly.  Cardiovascular:     Rate and Rhythm: Normal rate and regular rhythm.     Pulses: Normal pulses.     Heart sounds: Normal heart sounds. No murmur heard. Pulmonary:     Effort: Pulmonary effort is normal. No respiratory distress.     Breath sounds: Normal breath sounds. No wheezing, rhonchi or rales.  Musculoskeletal:     Cervical back: Neck supple.     Right lower leg: No edema.     Left lower leg: No edema.  Lymphadenopathy:     Cervical: No cervical adenopathy.  Skin:    General: Skin is warm and dry.     Findings: No rash.  Neurological:     Mental Status: She is alert and oriented to person, place, and time. Mental status is at baseline.  Psychiatric:        Mood and Affect: Mood normal.        Behavior: Behavior normal.      No results found for any visits on 02/15/22.  Assessment & Plan     Problem List Items Addressed This Visit       Cardiovascular and Mediastinum   Essential hypertension - Primary    Well controlled Continue current medications Recheck metabolic panel F/u in 6 months       Relevant Orders   Basic Metabolic Panel (BMET)     Return in about 6 months (around 08/18/2022) for CPE.      I, Lavon Paganini, MD, have reviewed all documentation for this visit. The documentation on 02/15/22 for the exam, diagnosis, procedures, and orders are all accurate and complete.   Bethene Hankinson, [name], MD, MPH [facility]

## 2022-02-15 ENCOUNTER — Encounter: Payer: Self-pay | Admitting: Family Medicine

## 2022-02-15 ENCOUNTER — Ambulatory Visit (INDEPENDENT_AMBULATORY_CARE_PROVIDER_SITE_OTHER): Payer: Medicare Other | Admitting: Family Medicine

## 2022-02-15 VITALS — BP 123/82 | HR 67 | Temp 98.4°F | Resp 16 | Ht 62.0 in | Wt 127.6 lb

## 2022-02-15 DIAGNOSIS — I10 Essential (primary) hypertension: Secondary | ICD-10-CM | POA: Diagnosis not present

## 2022-02-15 NOTE — Assessment & Plan Note (Signed)
Well controlled Continue current medications Recheck metabolic panel F/u in 6 months  

## 2022-02-16 LAB — BASIC METABOLIC PANEL
BUN/Creatinine Ratio: 12 (ref 12–28)
BUN: 9 mg/dL (ref 8–27)
CO2: 24 mmol/L (ref 20–29)
Calcium: 9.9 mg/dL (ref 8.7–10.3)
Chloride: 95 mmol/L — ABNORMAL LOW (ref 96–106)
Creatinine, Ser: 0.73 mg/dL (ref 0.57–1.00)
Glucose: 93 mg/dL (ref 70–99)
Potassium: 4.7 mmol/L (ref 3.5–5.2)
Sodium: 133 mmol/L — ABNORMAL LOW (ref 134–144)
eGFR: 83 mL/min/{1.73_m2} (ref >59)

## 2022-03-08 ENCOUNTER — Other Ambulatory Visit: Payer: Self-pay | Admitting: Internal Medicine

## 2022-06-12 ENCOUNTER — Other Ambulatory Visit: Payer: Self-pay | Admitting: Nurse Practitioner

## 2022-06-20 ENCOUNTER — Ambulatory Visit (INDEPENDENT_AMBULATORY_CARE_PROVIDER_SITE_OTHER): Payer: Medicare Other | Admitting: Nurse Practitioner

## 2022-06-20 ENCOUNTER — Encounter: Payer: Self-pay | Admitting: Nurse Practitioner

## 2022-06-20 VITALS — BP 130/84 | HR 60 | Temp 98.0°F | Resp 16 | Ht 62.0 in | Wt 130.2 lb

## 2022-06-20 DIAGNOSIS — R0602 Shortness of breath: Secondary | ICD-10-CM

## 2022-06-20 DIAGNOSIS — J449 Chronic obstructive pulmonary disease, unspecified: Secondary | ICD-10-CM

## 2022-06-20 NOTE — Progress Notes (Signed)
[facility] [place], [place] [address]  Internal MEDICINE  Office Visit Note  Patient Name: Karen Henson  [id number]  [id number]  Date of Service: 06/20/2022  Chief Complaint  Patient presents with   Follow-up   COPD   Quality Metric Gaps    Dexa Scan    HPI Karen Henson presents for a pulmonary follow up visit for COPD --Has not needed rescue inhaler since last visit --denies  --Likes to keep breztri samples on hand in case she needs them but does not take it every day.  --no spiro today but is overdue for PFT.    Current Medication: Outpatient Encounter Medications as of 06/20/2022  Medication Sig   acetaminophen (TYLENOL) 500 MG tablet Take 500 mg by mouth every 6 (six) hours as needed for moderate pain or headache.   albuterol (VENTOLIN HFA) 108 (90 Base) MCG/ACT inhaler Inhale 2 puffs into the lungs every 6 (six) hours as needed for wheezing or shortness of breath.   benzonatate (TESSALON) 200 MG capsule Take 1 capsule (200 mg total) by mouth 2 (two) times daily as needed for cough.   Budeson-Glycopyrrol-Formoterol (BREZTRI AEROSPHERE) 160-9-4.8 MCG/ACT AERO Inhale 2 puffs into the lungs 2 (two) times daily.   chlorpheniramine-HYDROcodone (TUSSIONEX PENNKINETIC ER) 10-8 MG/5ML Take 5 mLs by mouth every 12 (twelve) hours as needed for cough.   dextromethorphan-guaiFENesin (MUCINEX DM) 30-600 MG 12hr tablet Take 1 tablet by mouth daily.   loratadine (CLARITIN) 10 MG tablet Take 10 mg by mouth daily.    losartan-hydrochlorothiazide (HYZAAR) 50-12.5 MG tablet TAKE 1 TABLET BY MOUTH ONCE DAILY   montelukast (SINGULAIR) 10 MG tablet TAKE 1 TABLET BY MOUTH BEDTIME FOR ASTHMA   predniSONE (DELTASONE) 10 MG tablet Take one tab 3 x day for 3 days, then take one tab 2 x a day for 3 days and then take one tab a day for 3 days for copd   No facility-administered encounter medications on file as of 06/20/2022.    Surgical History: Past Surgical History:   Procedure Laterality Date   BREAST EXCISIONAL BIOPSY Left 1996   neg   BREAST SURGERY     CAPSULOTOMY     CATARACT EXTRACTION W/PHACO Left 10/31/2017   Procedure: CATARACT EXTRACTION PHACO AND INTRAOCULAR LENS PLACEMENT ([place]);  Surgeon: Eulogio Bear, MD;  Location: [facility] ORS;  Service: Ophthalmology;  Laterality: Left;  [place] 00:43.7 AP% 19.6 CDE 8.57 FLUID PACK LOT # [id number]   CATARACT EXTRACTION W/PHACO Right 12/05/2017   Procedure: CATARACT EXTRACTION PHACO AND INTRAOCULAR LENS PLACEMENT (I[facility]);  Surgeon: Eulogio Bear, MD;  Location: [facility] ORS;  Service: Ophthalmology;  Laterality: Right;  fluid pack lot #[id number]  exp 07/01/2019 [place]    00:31.4 AP%    8.8 CDE    2.79   COLONOSCOPY  02/15/2006   COLONOSCOPY WITH PROPOFOL N/A 03/21/2016   Procedure: COLONOSCOPY WITH PROPOFOL;  Surgeon: Robert Bellow, MD;  Location: [facility] ENDOSCOPY;  Service: Endoscopy;  Laterality: N/A;   COLONOSCOPY WITH PROPOFOL N/A 03/29/2021   Procedure: COLONOSCOPY WITH PROPOFOL;  Surgeon: Robert Bellow, MD;  Location: [facility] ENDOSCOPY;  Service: Endoscopy;  Laterality: N/A;   DILATION AND CURETTAGE OF UTERUS     EYE SURGERY     VARICOSE VEIN SURGERY      Medical History: Past Medical History:  Diagnosis Date   Allergy    Environmental Allergies   Arthritis    Bronchitis, chronic (HCC)    COPD (chronic obstructive pulmonary disease) (  [place])    Cough    CHRONIC   Dyspnea    OCCAS   Hypertension    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[place])    Osteoporosis     Family History: Family History  Problem Relation Age of Onset   Hypertension Mother    Emphysema Father    Stroke Sister    Stroke Brother    Diabetes Maternal Aunt    Diabetes Maternal Grandmother    Lung cancer Grandchild    Breast cancer Neg Hx    Ovarian cancer Neg Hx    Colon cancer Neg Hx     Social History   Socioeconomic History   Marital status: Widowed    Spouse name: Not on file   Number of children: 2   Years of education: Not  on file   Highest education level: High school graduate  Occupational History   Occupation: retired  Tobacco Use   Smoking status: Former    Packs/day: 0.25    Types: Cigarettes   Smokeless tobacco: Never   Tobacco comments:    smoked a pack a week, quit about 30 years ago  Vaping Use   Vaping Use: Never used  Substance and Sexual Activity   Alcohol use: Yes    Comment: ocassional- monthly   Drug use: No   Sexual activity: Never  Other Topics Concern   Not on file  Social History Narrative   Not on file   Social Determinants of Health   Financial Resource Strain: Low Risk  (08/11/2020)   Overall Financial Resource Strain (CARDIA)    Difficulty of Paying Living Expenses: Not hard at all  Food Insecurity: No Food Insecurity (08/11/2020)   Hunger Vital Sign    Worried About Running Out of Food in the Last Year: Never true    [place] in the Last Year: Never true  Transportation Needs: No Transportation Needs (08/11/2020)   PRAPARE - Hydrologist (Medical): No    Lack of Transportation (Non-Medical): No  Physical Activity: Sufficiently Active (08/11/2020)   Exercise Vital Sign    Days of Exercise per Week: 5 days    Minutes of Exercise per Session: 30 min  Stress: No Stress Concern Present (08/11/2020)   Nice    Feeling of Stress : Not at all  Social Connections: Moderately Integrated (08/11/2020)   Social Connection and Isolation Panel [NHANES]    Frequency of Communication with Friends and Family: More than three times a week    Frequency of Social Gatherings with Friends and Family: More than three times a week    Attends Religious Services: More than 4 times per year    Active Member of [employer] or Organizations: Yes    Attends Archivist Meetings: More than 4 times per year    Marital Status: Widowed  Intimate Partner Violence: Not At Risk (08/11/2020)    Humiliation, Afraid, Rape, and Kick questionnaire    Fear of Current or Ex-Partner: No    Emotionally Abused: No    Physically Abused: No    Sexually Abused: No      Review of Systems  Constitutional:  Negative for chills, fatigue and unexpected weight change.  HENT:  Negative for congestion, rhinorrhea, sneezing and sore throat.   Eyes:  Negative for redness.  Respiratory: Negative.  Negative for cough, chest tightness, shortness of breath and wheezing.   Cardiovascular:  Negative for chest pain  and palpitations.  Gastrointestinal:  Negative for abdominal pain, constipation, diarrhea, nausea and vomiting.  Genitourinary:  Negative for dysuria and frequency.  Musculoskeletal:  Negative for arthralgias, back pain, joint swelling and neck pain.  Skin:  Negative for rash.  Neurological: Negative.  Negative for tremors and numbness.  Hematological:  Negative for adenopathy. Does not bruise/bleed easily.  Psychiatric/Behavioral:  Negative for behavioral problems (Depression), sleep disturbance and suicidal ideas. The patient is not nervous/anxious.     Vital Signs: BP 130/84   Pulse 60   Temp 98 F (36.7 C)   Resp 16   Ht '5\' 2"'$  (1.575 m)   Wt 130 lb 3.2 oz (59.1 kg)   SpO2 98%   BMI 23.81 kg/m    Physical Exam Vitals reviewed.  Constitutional:      General: She is not in acute distress.    Appearance: Normal appearance. She is normal weight. She is not ill-appearing.  HENT:     Head: Normocephalic and atraumatic.  Eyes:     Pupils: Pupils are equal, round, and reactive to light.  Cardiovascular:     Rate and Rhythm: Normal rate and regular rhythm.     Heart sounds: Normal heart sounds. No murmur heard. Pulmonary:     Effort: Pulmonary effort is normal. No respiratory distress.     Breath sounds: Normal breath sounds. No wheezing.  Neurological:     Mental Status: She is alert and oriented to person, place, and time.  Psychiatric:        Mood and Affect: Mood  normal.        Behavior: Behavior normal.        Assessment/Plan: 1. Chronic obstructive pulmonary disease, unspecified COPD type ([place]) Follow up in 6 months, PFT overdue, test ordered to be done prior to next follow up - Pulmonary function test; Future  2. SOB (shortness of breath) PFT ordered - Pulmonary function test; Future   General Counseling: Karen Henson verbalizes understanding of the findings of todays visit and agrees with plan of treatment. I have discussed any further diagnostic evaluation that may be needed or ordered today. We also reviewed her medications today. she has been encouraged to call the office with any questions or concerns that should arise related to todays visit.    Orders Placed This Encounter  Procedures   Pulmonary function test    No orders of the defined types were placed in this encounter.   Return in about 6 months (around 12/19/2022) for F/U, pulmonary only with Shawnette Augello -- please schedule PFT test prior to 6 month f/u appt.   Total time spent:20 Minutes Time spent includes review of chart, medications, test results, and follow up plan with the patient.   [place] Controlled Substance Database was reviewed by me.  This patient was seen by Jonetta Osgood, FNP-C in collaboration with Dr. Clayborn Bigness as a part of collaborative care agreement.   Elody Kleinsasser R. Valetta Fuller, MSN, FNP-C Internal medicine

## 2022-07-11 ENCOUNTER — Other Ambulatory Visit: Payer: Self-pay | Admitting: Family Medicine

## 2022-07-11 DIAGNOSIS — Z1231 Encounter for screening mammogram for malignant neoplasm of breast: Secondary | ICD-10-CM

## 2022-08-06 ENCOUNTER — Other Ambulatory Visit: Payer: Self-pay | Admitting: Family Medicine

## 2022-08-08 DIAGNOSIS — Z23 Encounter for immunization: Secondary | ICD-10-CM | POA: Diagnosis not present

## 2022-08-15 ENCOUNTER — Ambulatory Visit
Admission: RE | Admit: 2022-08-15 | Discharge: 2022-08-15 | Disposition: A | Discharge status: Home / Self Care | Payer: Medicare Other | Source: Ambulatory Visit | Attending: Family Medicine | Admitting: Family Medicine

## 2022-08-15 DIAGNOSIS — Z1231 Encounter for screening mammogram for malignant neoplasm of breast: Secondary | ICD-10-CM | POA: Insufficient documentation

## 2022-08-21 NOTE — Progress Notes (Deleted)
I,Aahan Marques S Ajahnae Rathgeber,acting as a Education administrator for Lavon Paganini, MD.,have documented all relevant documentation on the behalf of Lavon Paganini, MD,as directed by  Lavon Paganini, MD while in the presence of Lavon Paganini, MD.    Annual Wellness Visit     Patient: Karen Henson, Female    DOB: [birth year], 81 y.o.   MRN: [id number] Visit Date: 08/27/2022  Today's Provider: Lavon Paganini, MD   No chief complaint on file.  Subjective    Karen Henson is a 81 y.o. female who presents today for her Annual Wellness Visit. She reports consuming a {diet types:17450} diet. {Exercise:19826} She generally feels {well/fairly well/poorly:18703}. She reports sleeping {well/fairly well/poorly:18703}. She {does/does not:200015} have additional problems to discuss today.   HPI  Medications: Outpatient Medications Prior to Visit  Medication Sig   acetaminophen (TYLENOL) 500 MG tablet Take 500 mg by mouth every 6 (six) hours as needed for moderate pain or headache.   albuterol (VENTOLIN HFA) 108 (90 Base) MCG/ACT inhaler Inhale 2 puffs into the lungs every 6 (six) hours as needed for wheezing or shortness of breath.   benzonatate (TESSALON) 200 MG capsule Take 1 capsule (200 mg total) by mouth 2 (two) times daily as needed for cough.   Budeson-Glycopyrrol-Formoterol (BREZTRI AEROSPHERE) 160-9-4.8 MCG/ACT AERO Inhale 2 puffs into the lungs 2 (two) times daily.   chlorpheniramine-HYDROcodone (TUSSIONEX PENNKINETIC ER) 10-8 MG/5ML Take 5 mLs by mouth every 12 (twelve) hours as needed for cough.   dextromethorphan-guaiFENesin (MUCINEX DM) 30-600 MG 12hr tablet Take 1 tablet by mouth daily.   loratadine (CLARITIN) 10 MG tablet Take 10 mg by mouth daily.    losartan-hydrochlorothiazide (HYZAAR) 50-12.5 MG tablet TAKE 1 TABLET BY MOUTH ONCE DAILY   montelukast (SINGULAIR) 10 MG tablet TAKE 1 TABLET BY MOUTH BEDTIME FOR ASTHMA   predniSONE (DELTASONE) 10 MG tablet Take one tab 3 x day for 3  days, then take one tab 2 x a day for 3 days and then take one tab a day for 3 days for copd   No facility-administered medications prior to visit.    Allergies  Allergen Reactions   Lisinopril Cough    Patient Care Team: Virginia Crews, MD as PCP - General (Family Medicine) Bary Castilla, Forest Gleason, MD (General Surgery) Allyne Gee, MD as Consulting Physician (Internal Medicine) Pa, [facility])  Review of Systems  All other systems reviewed and are negative.   Last CBC Lab Results  Component Value Date   WBC 7.0 08/11/2019   HGB 14.3 08/11/2019   HCT 41.9 08/11/2019   MCV 94 08/11/2019   MCH 32.0 08/11/2019   RDW 12.7 08/11/2019   PLT 290 45/80/9983   Last metabolic panel Lab Results  Component Value Date   GLUCOSE 93 02/15/2022   NA 133 (L) 02/15/2022   K 4.7 02/15/2022   CL 95 (L) 02/15/2022   CO2 24 02/15/2022   BUN 9 02/15/2022   CREATININE 0.73 02/15/2022   EGFR 83 02/15/2022   CALCIUM 9.9 02/15/2022   PROT 6.7 08/17/2021   ALBUMIN 4.6 08/17/2021   LABGLOB 2.1 08/17/2021   AGRATIO 2.2 08/17/2021   BILITOT 0.6 08/17/2021   ALKPHOS 45 08/17/2021   AST 16 08/17/2021   ALT 11 08/17/2021   Last lipids Lab Results  Component Value Date   CHOL 192 08/17/2021   HDL 59 08/17/2021   LDLCALC 117 (H) 08/17/2021   TRIG 88 08/17/2021   CHOLHDL 3.6 08/12/2020   Last hemoglobin  A1c No results found for: "HGBA1C" Last thyroid functions Lab Results  Component Value Date   TSH 1.760 01/30/2016   Last vitamin D No results found for: "25OHVITD2", "25OHVITD3", "VD25OH" Last vitamin B12 and Folate No results found for: "VITAMINB12", "FOLATE"      Objective    Vitals: There were no vitals taken for this visit. BP Readings from Last 3 Encounters:  06/20/22 130/84  02/15/22 123/82  12/20/21 125/90   Wt Readings from Last 3 Encounters:  06/20/22 130 lb 3.2 oz (59.1 kg)  02/15/22 127 lb 9.6 oz (57.9 kg)  12/20/21 130 lb 6.4 oz (59.1  kg)       Physical Exam ***  Most recent functional status assessment:    02/15/2022   10:49 AM  In your present state of health, do you have any difficulty performing the following activities:  Hearing? 0  Vision? 0  Difficulty concentrating or making decisions? 0  Walking or climbing stairs? 0  Dressing or bathing? 0  Doing errands, shopping? 0   Most recent fall risk assessment:    02/15/2022   10:48 AM  Fall Risk   Falls in the past year? 0  Number falls in past yr: 0  Injury with Fall? 0    Most recent depression screenings:    02/15/2022   10:48 AM 08/17/2021    9:45 AM  PHQ 2/9 Scores  PHQ - 2 Score 0 0  PHQ- 9 Score 0 0   Most recent cognitive screening:    08/17/2021    9:45 AM  6CIT Screen  What Year? 0 points  What month? 0 points  What time? 0 points  Count back from 20 0 points  Months in reverse 0 points  Repeat phrase 2 points  Total Score 2 points   Most recent Audit-C alcohol use screening    02/15/2022   10:49 AM  Alcohol Use Disorder Test (AUDIT)  1. How often do you have a drink containing alcohol? 2  2. How many drinks containing alcohol do you have on a typical day when you are drinking? 0  3. How often do you have six or more drinks on one occasion? 0  AUDIT-C Score 2   A score of 3 or more in women, and 4 or more in men indicates increased risk for alcohol abuse, EXCEPT if all of the points are from question 1   No results found for any visits on 08/27/22.  Assessment & Plan     Annual wellness visit done today including the all of the following: Reviewed patient's Family Medical History Reviewed and updated list of patient's medical providers Assessment of cognitive impairment was done Assessed patient's functional ability Established a written schedule for health screening [place] Completed and Reviewed  Exercise Activities and Dietary recommendations  Goals      DIET - REDUCE SUGAR INTAKE      Recommend to monitor sugar intake and avoid sweets in daily diet.          Immunization History  Administered Date(s) Administered   Influenza Inj Mdck Quad Pf 05/29/2019   Influenza, High Dose Seasonal PF 07/23/2017, 07/21/2018, 07/12/2020   Influenza-Unspecified 08/01/2015, 07/27/2021   Moderna Sars-Covid-2 Vaccination 10/13/2019, 11/10/2019, 08/17/2020   Pneumococcal Conjugate-13 08/01/2015   Pneumococcal Polysaccharide-23 07/26/2014   Td 02/26/2008   Zoster Recombinat (Shingrix) 07/21/2018, 09/26/2018   Zoster, Live 03/17/2009    Health Maintenance  Topic Date Due   DEXA SCAN  08/16/2021  INFLUENZA VACCINE  05/01/2022   COVID-19 Vaccine (4 - 2023-24 season) 06/01/2022   Medicare Annual Wellness (AWV)  08/17/2022   Pneumonia Vaccine 30+ Years old  Completed   Zoster Vaccines- Shingrix  Completed   HPV VACCINES  Aged Out   COLONOSCOPY (Pts 45-23yr Insurance coverage will need to be confirmed)  Discontinued     Discussed health benefits of physical activity, and encouraged her to engage in regular exercise appropriate for her age and condition.    ***  No follow-ups on file.     {provider attestation***:1}   ALavon Paganini MD  [phone](phone) [phone](fax)  [place]

## 2022-08-27 ENCOUNTER — Encounter: Payer: Medicare Other | Admitting: Family Medicine

## 2022-08-27 DIAGNOSIS — I10 Essential (primary) hypertension: Secondary | ICD-10-CM

## 2022-08-27 DIAGNOSIS — Z0000 Encounter for general adult medical examination without abnormal findings: Secondary | ICD-10-CM

## 2022-08-27 DIAGNOSIS — E782 Mixed hyperlipidemia: Secondary | ICD-10-CM

## 2022-08-27 DIAGNOSIS — M810 Age-related osteoporosis without current pathological fracture: Secondary | ICD-10-CM

## 2022-09-05 NOTE — Progress Notes (Unsigned)
I,Karen Henson,acting as a Education administrator for Karen Paganini, MD.,have documented all relevant documentation on the behalf of Karen Paganini, MD,as directed by  Karen Paganini, MD while in the presence of Karen Paganini, MD.    Annual Wellness Visit     Patient: Karen Henson, Female    DOB: 11/13/1940, 81 y.o.   MRN: [id number] Visit Date: 09/06/2022  Today's Provider: Lavon Paganini, MD   No chief complaint on file.  Subjective    Karen Henson is a 81 y.o. female who presents today for her Annual Wellness Visit. She reports consuming a {diet types:17450} diet. {Exercise:19826} She generally feels {well/fairly well/poorly:18703}. She reports sleeping {well/fairly well/poorly:18703}. She {does/does not:200015} have additional problems to discuss today.   HPI  Medications: Outpatient Medications Prior to Visit  Medication Sig   acetaminophen (TYLENOL) 500 MG tablet Take 500 mg by mouth every 6 (six) hours as needed for moderate pain or headache.   albuterol (VENTOLIN HFA) 108 (90 Base) MCG/ACT inhaler Inhale 2 puffs into the lungs every 6 (six) hours as needed for wheezing or shortness of breath.   benzonatate (TESSALON) 200 MG capsule Take 1 capsule (200 mg total) by mouth 2 (two) times daily as needed for cough.   Budeson-Glycopyrrol-Formoterol (BREZTRI AEROSPHERE) 160-9-4.8 MCG/ACT AERO Inhale 2 puffs into the lungs 2 (two) times daily.   chlorpheniramine-HYDROcodone (TUSSIONEX PENNKINETIC ER) 10-8 MG/5ML Take 5 mLs by mouth every 12 (twelve) hours as needed for cough.   dextromethorphan-guaiFENesin (MUCINEX DM) 30-600 MG 12hr tablet Take 1 tablet by mouth daily.   loratadine (CLARITIN) 10 MG tablet Take 10 mg by mouth daily.    losartan-hydrochlorothiazide (HYZAAR) 50-12.5 MG tablet TAKE 1 TABLET BY MOUTH ONCE DAILY   montelukast (SINGULAIR) 10 MG tablet TAKE 1 TABLET BY MOUTH BEDTIME FOR ASTHMA   predniSONE (DELTASONE) 10 MG tablet Take one tab 3 x day for 3  days, then take one tab 2 x a day for 3 days and then take one tab a day for 3 days for copd   No facility-administered medications prior to visit.    Allergies  Allergen Reactions   Lisinopril Cough    Patient Care Team: Virginia Crews, MD as PCP - General (Family Medicine) Bary Castilla, Forest Gleason, MD (General Surgery) Allyne Gee, MD as Consulting Physician (Internal Medicine) Pa, [facility])  Review of Systems  All other systems reviewed and are negative.   Last CBC Lab Results  Component Value Date   WBC 7.0 08/11/2019   HGB 14.3 08/11/2019   HCT 41.9 08/11/2019   MCV 94 08/11/2019   MCH 32.0 08/11/2019   RDW 12.7 08/11/2019   PLT 290 27/25/3664   Last metabolic panel Lab Results  Component Value Date   GLUCOSE 93 02/15/2022   NA 133 (L) 02/15/2022   K 4.7 02/15/2022   CL 95 (L) 02/15/2022   CO2 24 02/15/2022   BUN 9 02/15/2022   CREATININE 0.73 02/15/2022   EGFR 83 02/15/2022   CALCIUM 9.9 02/15/2022   PROT 6.7 08/17/2021   ALBUMIN 4.6 08/17/2021   LABGLOB 2.1 08/17/2021   AGRATIO 2.2 08/17/2021   BILITOT 0.6 08/17/2021   ALKPHOS 45 08/17/2021   AST 16 08/17/2021   ALT 11 08/17/2021   Last lipids Lab Results  Component Value Date   CHOL 192 08/17/2021   HDL 59 08/17/2021   LDLCALC 117 (H) 08/17/2021   TRIG 88 08/17/2021   CHOLHDL 3.6 08/12/2020   Last thyroid  functions Lab Results  Component Value Date   TSH 1.760 01/30/2016    Objective    Vitals: There were no vitals taken for this visit. BP Readings from Last 3 Encounters:  06/20/22 130/84  02/15/22 123/82  12/20/21 125/90   Wt Readings from Last 3 Encounters:  06/20/22 130 lb 3.2 oz (59.1 kg)  02/15/22 127 lb 9.6 oz (57.9 kg)  12/20/21 130 lb 6.4 oz (59.1 kg)       Physical Exam ***  Most recent functional status assessment:    02/15/2022   10:49 AM  In your present state of health, do you have any difficulty performing the following activities:   Hearing? 0  Vision? 0  Difficulty concentrating or making decisions? 0  Walking or climbing stairs? 0  Dressing or bathing? 0  Doing errands, shopping? 0   Most recent fall risk assessment:    02/15/2022   10:48 AM  Fall Risk   Falls in the past year? 0  Number falls in past yr: 0  Injury with Fall? 0    Most recent depression screenings:    02/15/2022   10:48 AM 08/17/2021    9:45 AM  PHQ 2/9 Scores  PHQ - 2 Score 0 0  PHQ- 9 Score 0 0   Most recent cognitive screening:    08/17/2021    9:45 AM  6CIT Screen  What Year? 0 points  What month? 0 points  What time? 0 points  Count back from 20 0 points  Months in reverse 0 points  Repeat phrase 2 points  Total Score 2 points   Most recent Audit-C alcohol use screening    02/15/2022   10:49 AM  Alcohol Use Disorder Test (AUDIT)  1. How often do you have a drink containing alcohol? 2  2. How many drinks containing alcohol do you have on a typical day when you are drinking? 0  3. How often do you have six or more drinks on one occasion? 0  AUDIT-C Score 2   A score of 3 or more in women, and 4 or more in men indicates increased risk for alcohol abuse, EXCEPT if all of the points are from question 1   No results found for any visits on 09/06/22.  Assessment & Plan     Annual wellness visit done today including the all of the following: Reviewed patient's Family Medical History Reviewed and updated list of patient's medical providers Assessment of cognitive impairment was done Assessed patient's functional ability Established a written schedule for health screening [place] Completed and Reviewed  Exercise Activities and Dietary recommendations  Goals      DIET - REDUCE SUGAR INTAKE     Recommend to monitor sugar intake and avoid sweets in daily diet.          Immunization History  Administered Date(s) Administered   Fluad Quad(high Dose 65+) 08/08/2022   Influenza Inj Mdck Quad  Pf 05/29/2019   Influenza, High Dose Seasonal PF 07/23/2017, 07/21/2018, 07/12/2020   Influenza-Unspecified 08/01/2015, 07/27/2021   Moderna Covid-19 Vaccine Bivalent Booster 68yr & up 08/20/2021, 08/08/2022   Moderna Sars-Covid-2 Vaccination 10/13/2019, 11/10/2019, 08/17/2020, 04/25/2021   Pneumococcal Conjugate-13 08/01/2015   Pneumococcal Polysaccharide-23 07/26/2014   Rsv, Bivalent, Protein Subunit Rsvpref,pf ([place] 08/08/2022   Td 02/26/2008   Zoster Recombinat (Shingrix) 07/21/2018, 09/26/2018   Zoster, Live 03/17/2009    Health Maintenance  Topic Date Due   DTaP/Tdap/Td (2 - Tdap) 02/25/2018   DEXA  SCAN  08/16/2021   Medicare Annual Wellness (AWV)  08/17/2022   COVID-19 Vaccine (7 - 2023-24 season) 10/03/2022   Pneumonia Vaccine 7+ Years old  Completed   INFLUENZA VACCINE  Completed   Zoster Vaccines- Shingrix  Completed   HPV VACCINES  Aged Out   COLONOSCOPY (Pts 45-109yr Insurance coverage will need to be confirmed)  Discontinued     Discussed health benefits of physical activity, and encouraged her to engage in regular exercise appropriate for her age and condition.    ***  No follow-ups on file.     {provider attestation***:1}   ALavon Paganini MD  [facility]3606 [phone]) [phone](fax)  [place]

## 2022-09-06 ENCOUNTER — Ambulatory Visit (INDEPENDENT_AMBULATORY_CARE_PROVIDER_SITE_OTHER): Payer: Medicare Other | Admitting: Family Medicine

## 2022-09-06 ENCOUNTER — Encounter: Payer: Self-pay | Admitting: Family Medicine

## 2022-09-06 VITALS — BP 122/88 | HR 80 | Temp 97.6°F | Resp 16 | Ht 61.0 in | Wt 131.5 lb

## 2022-09-06 DIAGNOSIS — Z0000 Encounter for general adult medical examination without abnormal findings: Secondary | ICD-10-CM

## 2022-09-06 DIAGNOSIS — I10 Essential (primary) hypertension: Secondary | ICD-10-CM | POA: Diagnosis not present

## 2022-09-06 DIAGNOSIS — E782 Mixed hyperlipidemia: Secondary | ICD-10-CM | POA: Diagnosis not present

## 2022-09-06 DIAGNOSIS — Z13820 Encounter for screening for osteoporosis: Secondary | ICD-10-CM

## 2022-09-06 DIAGNOSIS — M810 Age-related osteoporosis without current pathological fracture: Secondary | ICD-10-CM | POA: Diagnosis not present

## 2022-09-06 NOTE — Assessment & Plan Note (Signed)
Well controlled Continue current medications Recheck metabolic panel F/u in 6 months  

## 2022-09-06 NOTE — Assessment & Plan Note (Signed)
Reviewed last lipid panel Not currently on a statin Recheck FLP and CMP Discussed diet and exercise  

## 2022-09-06 NOTE — Assessment & Plan Note (Signed)
Failed Fosamax in the past Continue calcium and vitamin D supplementation and weightbearing exercise Repeat DEXA Consider Prolia in the future

## 2022-11-07 DIAGNOSIS — I10 Essential (primary) hypertension: Secondary | ICD-10-CM | POA: Diagnosis not present

## 2022-11-07 DIAGNOSIS — E782 Mixed hyperlipidemia: Secondary | ICD-10-CM | POA: Diagnosis not present

## 2022-11-07 DIAGNOSIS — Z0000 Encounter for general adult medical examination without abnormal findings: Secondary | ICD-10-CM | POA: Diagnosis not present

## 2022-11-08 LAB — COMPREHENSIVE METABOLIC PANEL
ALT: 11 IU/L (ref 0–32)
AST: 18 IU/L (ref 0–40)
Albumin/Globulin Ratio: 1.9 (ref 1.2–2.2)
Albumin: 4.5 g/dL (ref 3.8–4.8)
Alkaline Phosphatase: 36 IU/L — ABNORMAL LOW (ref 44–121)
BUN/Creatinine Ratio: 18 (ref 12–28)
BUN: 14 mg/dL (ref 8–27)
Bilirubin Total: 0.8 mg/dL (ref 0.0–1.2)
CO2: 22 mmol/L (ref 20–29)
Calcium: 9.6 mg/dL (ref 8.7–10.3)
Chloride: 98 mmol/L (ref 96–106)
Creatinine, Ser: 0.79 mg/dL (ref 0.57–1.00)
Globulin, Total: 2.4 g/dL (ref 1.5–4.5)
Glucose: 96 mg/dL (ref 70–99)
Potassium: 5.1 mmol/L (ref 3.5–5.2)
Sodium: 135 mmol/L (ref 134–144)
Total Protein: 6.9 g/dL (ref 6.0–8.5)
eGFR: 76 mL/min/{1.73_m2} (ref >59)

## 2022-11-08 LAB — LIPID PANEL WITH LDL/HDL RATIO
Cholesterol, Total: 166 mg/dL (ref 100–199)
HDL: 57 mg/dL (ref >39)
LDL Chol Calc (NIH): 92 mg/dL (ref 0–99)
LDL/HDL Ratio: 1.6 ratio (ref 0.0–3.2)
Triglycerides: 92 mg/dL (ref 0–149)
VLDL Cholesterol Cal: 17 mg/dL (ref 5–40)

## 2022-11-28 ENCOUNTER — Telehealth: Payer: Self-pay | Admitting: Internal Medicine

## 2022-11-28 NOTE — Telephone Encounter (Signed)
Left vm to confirm 12/05/2022 appointment-Toni

## 2022-12-05 ENCOUNTER — Ambulatory Visit (INDEPENDENT_AMBULATORY_CARE_PROVIDER_SITE_OTHER): Payer: Medicare Other | Admitting: Internal Medicine

## 2022-12-05 DIAGNOSIS — J449 Chronic obstructive pulmonary disease, unspecified: Secondary | ICD-10-CM | POA: Diagnosis not present

## 2022-12-05 DIAGNOSIS — R0602 Shortness of breath: Secondary | ICD-10-CM

## 2022-12-12 ENCOUNTER — Ambulatory Visit: Payer: Medicare Other | Admitting: Nurse Practitioner

## 2022-12-17 ENCOUNTER — Ambulatory Visit: Payer: Medicare Other | Admitting: Internal Medicine

## 2022-12-20 ENCOUNTER — Ambulatory Visit (INDEPENDENT_AMBULATORY_CARE_PROVIDER_SITE_OTHER): Payer: Medicare Other | Admitting: Internal Medicine

## 2022-12-20 ENCOUNTER — Encounter: Payer: Self-pay | Admitting: Internal Medicine

## 2022-12-20 VITALS — BP 142/100 | HR 60 | Temp 98.2°F | Resp 16 | Ht 61.0 in | Wt 130.4 lb

## 2022-12-20 DIAGNOSIS — J84112 Idiopathic pulmonary fibrosis: Secondary | ICD-10-CM

## 2022-12-20 DIAGNOSIS — R0602 Shortness of breath: Secondary | ICD-10-CM | POA: Diagnosis not present

## 2022-12-20 DIAGNOSIS — J449 Chronic obstructive pulmonary disease, unspecified: Secondary | ICD-10-CM

## 2022-12-20 NOTE — Progress Notes (Signed)
[facility] [place], [place] [address]  Pulmonary Sleep Medicine   Office Visit Note  Patient Name: Karen Henson DOB: April 21, 1941 MRN [id number]  Date of Service: 12/20/2022  Complaints/HPI: She has been doing well overall. She has no admission states her shots are up to date. She does get some shortness of breath mainly with exertion. Currently not on oxygen  ROS  General: (-) fever, (-) chills, (-) night sweats, (-) weakness Skin: (-) rashes, (-) itching,. Eyes: (-) visual changes, (-) redness, (-) itching. Nose and Sinuses: (-) nasal stuffiness or itchiness, (-) postnasal drip, (-) nosebleeds, (-) sinus trouble. Mouth and Throat: (-) sore throat, (-) hoarseness. Neck: (-) swollen glands, (-) enlarged thyroid, (-) neck pain. Respiratory: - cough, (-) bloody sputum, + shortness of breath, - wheezing. Cardiovascular: - ankle swelling, (-) chest pain. Lymphatic: (-) lymph node enlargement. Neurologic: (-) numbness, (-) tingling. Psychiatric: (-) anxiety, (-) depression   Current Medication: Outpatient Encounter Medications as of 12/20/2022  Medication Sig   acetaminophen (TYLENOL) 500 MG tablet Take 500 mg by mouth every 6 (six) hours as needed for moderate pain or headache.   albuterol (VENTOLIN HFA) 108 (90 Base) MCG/ACT inhaler Inhale 2 puffs into the lungs every 6 (six) hours as needed for wheezing or shortness of breath.   benzonatate (TESSALON) 200 MG capsule Take 1 capsule (200 mg total) by mouth 2 (two) times daily as needed for cough.   Budeson-Glycopyrrol-Formoterol (BREZTRI AEROSPHERE) 160-9-4.8 MCG/ACT AERO Inhale 2 puffs into the lungs 2 (two) times daily.   chlorpheniramine-HYDROcodone (TUSSIONEX PENNKINETIC ER) 10-8 MG/5ML Take 5 mLs by mouth every 12 (twelve) hours as needed for cough.   dextromethorphan-guaiFENesin (MUCINEX DM) 30-600 MG 12hr tablet Take 1 tablet by mouth daily.   loratadine (CLARITIN) 10 MG tablet Take 10 mg by mouth  daily.    losartan-hydrochlorothiazide (HYZAAR) 50-12.5 MG tablet TAKE 1 TABLET BY MOUTH ONCE DAILY   montelukast (SINGULAIR) 10 MG tablet TAKE 1 TABLET BY MOUTH BEDTIME FOR ASTHMA   predniSONE (DELTASONE) 10 MG tablet Take one tab 3 x day for 3 days, then take one tab 2 x a day for 3 days and then take one tab a day for 3 days for copd   No facility-administered encounter medications on file as of 12/20/2022.    Surgical History: Past Surgical History:  Procedure Laterality Date   BREAST EXCISIONAL BIOPSY Left 1996   neg   BREAST SURGERY     CAPSULOTOMY     CATARACT EXTRACTION W/PHACO Left 10/31/2017   Procedure: CATARACT EXTRACTION PHACO AND INTRAOCULAR LENS PLACEMENT ([place]);  Surgeon: Eulogio Bear, MD;  Location: [facility] ORS;  Service: Ophthalmology;  Laterality: Left;  [place] 00:43.7 AP% 19.6 CDE 8.57 FLUID PACK LOT # [id number]   CATARACT EXTRACTION W/PHACO Right 12/05/2017   Procedure: CATARACT EXTRACTION PHACO AND INTRAOCULAR LENS PLACEMENT (IOC);  Surgeon: Eulogio Bear, MD;  Location: [facility] ORS;  Service: Ophthalmology;  Laterality: Right;  fluid pack lot #[id number]  exp 07/01/2019 [place]    00:31.4 AP%    8.8 CDE    2.79   COLONOSCOPY  02/15/2006   COLONOSCOPY WITH PROPOFOL N/A 03/21/2016   Procedure: COLONOSCOPY WITH PROPOFOL;  Surgeon: Robert Bellow, MD;  Location: [facility] ENDOSCOPY;  Service: Endoscopy;  Laterality: N/A;   COLONOSCOPY WITH PROPOFOL N/A 03/29/2021   Procedure: COLONOSCOPY WITH PROPOFOL;  Surgeon: Robert Bellow, MD;  Location: [facility] ENDOSCOPY;  Service: Endoscopy;  Laterality: N/A;   DILATION AND CURETTAGE  OF UTERUS     EYE SURGERY     VARICOSE VEIN SURGERY      Medical History: Past Medical History:  Diagnosis Date   Allergy    Environmental Allergies   Arthritis    Bronchitis, chronic (HCC)    COPD (chronic obstructive pulmonary disease) (HCC)    Cough    CHRONIC   Dyspnea    OCCAS   Hypertension    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[place])    Osteoporosis      Family History: Family History  Problem Relation Age of Onset   Hypertension Mother    Emphysema Father    Stroke Sister    Stroke Brother    Diabetes Maternal Aunt    Diabetes Maternal Grandmother    Lung cancer Grandchild    Breast cancer Neg Hx    Ovarian cancer Neg Hx    Colon cancer Neg Hx     Social History: Social History   Socioeconomic History   Marital status: Widowed    Spouse name: Not on file   Number of children: 2   Years of education: Not on file   Highest education level: High school graduate  Occupational History   Occupation: retired  Tobacco Use   Smoking status: Former    Packs/day: .25    Types: Cigarettes   Smokeless tobacco: Never   Tobacco comments:    smoked a pack a week, quit about 30 years ago  Vaping Use   Vaping Use: Never used  Substance and Sexual Activity   Alcohol use: Yes    Comment: ocassional- monthly   Drug use: No   Sexual activity: Never  Other Topics Concern   Not on file  Social History Narrative   Not on file   Social Determinants of Health   Financial Resource Strain: Low Risk  (08/11/2020)   Overall Financial Resource Strain (CARDIA)    Difficulty of Paying Living Expenses: Not hard at all  Food Insecurity: No Food Insecurity (08/11/2020)   Hunger Vital Sign    Worried About Running Out of Food in the Last Year: Never true    [place] in the Last Year: Never true  Transportation Needs: No Transportation Needs (08/11/2020)   PRAPARE - Hydrologist (Medical): No    Lack of Transportation (Non-Medical): No  Physical Activity: Sufficiently Active (08/11/2020)   Exercise Vital Sign    Days of Exercise per Week: 5 days    Minutes of Exercise per Session: 30 min  Stress: No Stress Concern Present (08/11/2020)   [place]    Feeling of Stress : Not at all  Social Connections: Moderately Integrated  (08/11/2020)   Social Connection and Isolation Panel [NHANES]    Frequency of Communication with Friends and Family: More than three times a week    Frequency of Social Gatherings with Friends and Family: More than three times a week    Attends Religious Services: More than 4 times per year    Active Member of [employer] or Organizations: Yes    Attends Archivist Meetings: More than 4 times per year    Marital Status: Widowed  Intimate Partner Violence: Not At Risk (08/11/2020)   Humiliation, Afraid, Rape, and Kick questionnaire    Fear of Current or Ex-Partner: No    Emotionally Abused: No    Physically Abused: No    Sexually Abused: No    Vital  Signs: Blood pressure (!) 142/100, pulse 60, temperature 98.2 F (36.8 C), resp. rate 16, height 5\' 1"  (1.549 m), weight 130 lb 6.4 oz (59.1 kg), SpO2 97 %.  Examination: General Appearance: The patient is well-developed, well-nourished, and in no distress. Skin: Gross inspection of skin unremarkable. Head: normocephalic, no gross deformities. Eyes: no gross deformities noted. ENT: ears appear grossly normal no exudates. Neck: Supple. No thyromegaly. No LAD. Respiratory: few crackles. Cardiovascular: Normal S1 and S2 without murmur or rub. Extremities: No cyanosis. pulses are equal. Neurologic: Alert and oriented. No involuntary movements.  LABS: Recent Results (from the past 2160 hour(s))  Comprehensive metabolic panel     Status: Abnormal   Collection Time: 11/07/22  8:49 AM  Result Value Ref Range   Glucose 96 70 - 99 mg/dL   BUN 14 8 - 27 mg/dL   Creatinine, Ser 0.79 0.57 - 1.00 mg/dL   eGFR 76 >59 mL/min/1.73   BUN/Creatinine Ratio 18 12 - 28   Sodium 135 134 - 144 mmol/L   Potassium 5.1 3.5 - 5.2 mmol/L   Chloride 98 96 - 106 mmol/L   CO2 22 20 - 29 mmol/L   Calcium 9.6 8.7 - 10.3 mg/dL   Total Protein 6.9 6.0 - 8.5 g/dL   Albumin 4.5 3.8 - 4.8 g/dL   Globulin, Total 2.4 1.5 - 4.5 g/dL   Albumin/Globulin Ratio  1.9 1.2 - 2.2   Bilirubin Total 0.8 0.0 - 1.2 mg/dL   Alkaline Phosphatase 36 (L) 44 - 121 IU/L   AST 18 0 - 40 IU/L   ALT 11 0 - 32 IU/L  Lipid Panel With LDL/HDL Ratio     Status: None   Collection Time: 11/07/22  8:49 AM  Result Value Ref Range   Cholesterol, Total 166 100 - 199 mg/dL   Triglycerides 92 0 - 149 mg/dL   HDL 57 >39 mg/dL   VLDL Cholesterol Cal 17 5 - 40 mg/dL   LDL Chol Calc (NIH) 92 0 - 99 mg/dL   LDL/HDL Ratio 1.6 0.0 - 3.2 ratio    Comment:                                     LDL/HDL Ratio                                             Men  Women                               1/2 Avg.Risk  1.0    1.5                                   Avg.Risk  3.6    3.2                                2X Avg.Risk  6.2    5.0                                3X Avg.Risk  8.0  6.1     Radiology: MM 3D SCREEN BREAST BILATERAL  Result Date: 08/16/2022 CLINICAL DATA:  Screening. EXAM: DIGITAL SCREENING BILATERAL MAMMOGRAM WITH TOMOSYNTHESIS AND CAD TECHNIQUE: Bilateral screening digital craniocaudal and mediolateral oblique mammograms were obtained. Bilateral screening digital breast tomosynthesis was performed. The images were evaluated with computer-aided detection. COMPARISON:  Previous exam(s). ACR Breast Density Category c: The breast tissue is heterogeneously dense, which may obscure small masses. FINDINGS: There are no findings suspicious for malignancy. IMPRESSION: No mammographic evidence of malignancy. A result letter of this screening mammogram will be mailed directly to the patient. RECOMMENDATION: Screening mammogram in one year. (Code:SM-B-01Y) BI-RADS CATEGORY  1: Negative. Electronically Signed   By: Claudie Revering M.D.   On: 08/16/2022 16:23    No results found.  No results found.    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 Chronic obstructive pulmonary disease, unspecified COPD type ([place]) She has been using inhalers and gets benefit for exertion - Pulmonary function test; Future  2. SOB (shortness of breath) She is at baseline  3. IPF (idiopathic pulmonary fibrosis) ([place]) PFT will be ordered   General Counseling: I have discussed the findings of the evaluation and examination with [place].  I have also discussed any further diagnostic evaluation thatmay be needed or ordered today. Matha verbalizes understanding of the findings of todays visit. We also reviewed her medications today and discussed drug interactions and side effects including but not limited excessive drowsiness and altered mental states. We also discussed that there is always a risk not just to her but also people around her. she has been encouraged to call the office with any questions or concerns that should arise related to todays visit.  No orders of the defined types were placed in this encounter.    Time spent: 49  I have personally obtained a history, examined the patient, evaluated laboratory and imaging results, formulated the assessment and plan and placed orders.    Allyne Gee, MD [facility] Pulmonary and Critical Care Sleep medicine

## 2022-12-20 NOTE — Patient Instructions (Signed)
Pulmonary Fibrosis  Pulmonary fibrosis is a type of lung disease that causes scarring. Over time, the scar tissue builds up in the air sacs of your lungs (alveoli). This makes it hard for you to breathe because less oxygen gets into your bloodstream. Scarring from pulmonary fibrosis is permanent and may lead to other serious health problems. What are the causes? There are many different causes of pulmonary fibrosis. In some cases, the cause is not known. This is called idiopathic pulmonary fibrosis. Other causes include: Exposure to chemicals and substances found in agricultural, farm, construction, or factory work. These include mold, asbestos, silica, metal dusts, and toxic fumes. Sarcoidosis. In this disease, areas of inflammatory cells (granulomas) form and most often affect the lungs. Autoimmune diseases. These include diseases such as rheumatoid arthritis, systemic sclerosis, or connective tissue disease. Taking certain medicines. These include drugs used in radiation therapy or used to treat seizures, heart problems, and some infections. What increases the risk? You are more likely to develop this condition if: You have a family history of the disease. You are an older person. The condition is more common in older adults. You have a history of smoking. You have a job that exposes you to certain chemicals. You have gastroesophageal reflux disease (GERD). What are the signs or symptoms? Symptoms of this condition include: Difficulty breathing that gets worse with activity. Shortness of breath (dyspnea). Dry, hacking cough. Rapid, shallow breathing during exercise or while at rest. Other symptoms may include: Loss of appetite or weight loss Tiredness (fatigue) or weakness. Bluish skin and lips. Rounded and enlarged fingertips (clubbing). How is this diagnosed? This condition may be diagnosed based on: Your symptoms and medical history. A physical exam. You may also have tests,  including: A test that involves looking inside your lungs with an instrument (bronchoscopy). Imaging studies of your lungs and heart. Tests to measure how well you are breathing (pulmonary function tests). Blood tests. Tests to see how well your lungs work while you are walking (pulmonary stress test). A procedure to remove a lung tissue sample to look at it under a microscope (biopsy). How is this treated? There is no cure for pulmonary fibrosis. Treatment focuses on managing symptoms and preventing scarring from getting worse. This may include: Medicines, such as: Steroids to prevent permanent lung changes. Medicines to suppress your body's defense system (immune system). Medicines to help with lung function by reducing inflammation or scarring. Ongoing monitoring with X-rays and lab work. Oxygen therapy. Pulmonary rehabilitation. Surgery. In some cases, a lung transplant is possible. Follow these instructions at home:  Medicines Take over-the-counter and prescription medicines only as told by your health care provider. Keep your vaccinations up to date as recommended by your health care provider. Activity Get regular exercise, but do not pick activities that are too strenuous for you. Ask your health care provider what activities are safe for you. If you have physical limitations, you may get exercise by walking, using a stationary bike, or doing chair exercises. Ask your health care provider about using oxygen while exercising. Do breathing exercises as told by your health care provider. Plan rest periods when you get tired. General instructions Do not use any products that contain nicotine or tobacco. These products include cigarettes, chewing tobacco, and vaping devices, such as e-cigarettes. If you need help quitting, ask your health care provider. If you are exposed to chemicals and substances at work, make sure that you wear a mask or respirator at all times. Learn to   manage  stress. If you need help to do this, ask your health care provider. Join a pulmonary rehabilitation program or a support group for people with pulmonary fibrosis. Eat small meals often so you do not get too full. Overeating can make breathing trouble worse. Maintain a healthy weight. Lose weight if you need to. Keep all follow-up visits. This is important. Where to find more information American Lung Association: www.lung.org National Heart, Lung, and Blood Institute: www.nhlbi.nih.gov Pulmonary Fibrosis Foundation: pulmonaryfibrosis.org Contact a health care provider if: You have symptoms that do not get better with medicines. You are not able to be as active as usual. You have trouble taking a deep breath. You have a fever or chills. You have blue lips or skin. You have a lot of headaches. You cough up mucus that is dark in color. You have feelings of depression or sadness. You are unable to sleep because it is hard to breathe. Get help right away if: Your symptoms suddenly worsen. You have chest pain. You cough up blood. You get very confused or sleepy. These symptoms may be an emergency. Get help right away. Call 911. Do not wait to see if the symptoms will go away. Do not drive yourself to the hospital. Summary Pulmonary fibrosis is a type of lung disease that causes scar tissue to build up in the air sacs of your lungs (alveoli) over time. This makes it hard for you to breathe because less oxygen gets into your bloodstream. Scarring from pulmonary fibrosis is permanent and may lead to other serious health problems. You are more likely to develop this condition if you have a family history of the condition or a job that exposes you to certain chemicals. There is no cure for pulmonary fibrosis. Treatment focuses on managing symptoms and preventing scarring from getting worse. This information is not intended to replace advice given to you by your health care provider. Make sure  you discuss any questions you have with your health care provider. Document Revised: 05/09/2021 Document Reviewed: 05/09/2021 Elsevier Patient Education  2023 Elsevier Inc.  

## 2022-12-24 NOTE — Procedures (Signed)
[facility] [address] [place], [address]    Complete Pulmonary Function Testing Interpretation:  FINDINGS:  Forced vital capacity is mildly decreased.  FEV1 is 0.96 L which is 54% of predicted and is moderately decreased.  FEV1 FVC ratio is moderately decreased.  Postbronchodilator no significant change in FEV1 is noted.  Total lung capacity is increased residual volume is increased FRC is increased.  DLCO was normal.  IMPRESSION:  This pulmonary function study is suggestive of moderate obstructive lung disease  Allyne Gee, MD [facility] Pulmonary Critical Care Medicine Sleep Medicine

## 2022-12-26 ENCOUNTER — Ambulatory Visit
Admission: RE | Admit: 2022-12-26 | Discharge: 2022-12-26 | Disposition: A | Discharge status: Home / Self Care | Payer: Medicare Other | Source: Ambulatory Visit | Attending: Family Medicine | Admitting: Family Medicine

## 2022-12-26 DIAGNOSIS — Z0000 Encounter for general adult medical examination without abnormal findings: Secondary | ICD-10-CM | POA: Diagnosis not present

## 2022-12-26 DIAGNOSIS — M810 Age-related osteoporosis without current pathological fracture: Secondary | ICD-10-CM | POA: Insufficient documentation

## 2022-12-27 LAB — PULMONARY FUNCTION TEST

## 2022-12-31 ENCOUNTER — Telehealth: Payer: Self-pay

## 2022-12-31 DIAGNOSIS — M810 Age-related osteoporosis without current pathological fracture: Secondary | ICD-10-CM

## 2022-12-31 NOTE — Telephone Encounter (Signed)
-----   Message from Virginia Crews, MD sent at 12/31/2022  8:14 AM EDT ----- Bone density shows continued worsening of osteoporosis.  I'd consider Endocrinology referral to discuss Prolia as a treatment option (ok to place referral if patient agrees).

## 2023-01-31 DIAGNOSIS — I10 Essential (primary) hypertension: Secondary | ICD-10-CM | POA: Diagnosis not present

## 2023-01-31 DIAGNOSIS — M810 Age-related osteoporosis without current pathological fracture: Secondary | ICD-10-CM | POA: Diagnosis not present

## 2023-02-02 ENCOUNTER — Other Ambulatory Visit: Payer: Self-pay | Admitting: Family Medicine

## 2023-02-04 NOTE — Telephone Encounter (Signed)
Requested Prescriptions  Pending Prescriptions Disp Refills   losartan-hydrochlorothiazide (HYZAAR) 50-12.5 MG tablet [Pharmacy Med Name: LOSARTAN POTASSIUM-HCTZ 50-12.5 MG] 90 tablet 1    Sig: TAKE 1 TABLET BY MOUTH ONCE DAILY     Cardiovascular: ARB + Diuretic Combos Failed - 02/02/2023 12:19 PM      Failed - Last BP in normal range    BP Readings from Last 1 Encounters:  12/20/22 (!) 142/100         Passed - K in normal range and within 180 days    Potassium  Date Value Ref Range Status  11/07/2022 5.1 3.5 - 5.2 mmol/L Final  07/16/2013 4.4 3.5 - 5.1 mmol/L Final         Passed - Na in normal range and within 180 days    Sodium  Date Value Ref Range Status  11/07/2022 135 134 - 144 mmol/L Final         Passed - Cr in normal range and within 180 days    Creat  Date Value Ref Range Status  08/05/2017 0.77 0.60 - 0.93 mg/dL Final    Comment:    For patients >86 years of age, the reference limit for Creatinine is approximately 13% higher for people identified as African-American. .    Creatinine, Ser  Date Value Ref Range Status  11/07/2022 0.79 0.57 - 1.00 mg/dL Final         Passed - eGFR is 10 or above and within 180 days    GFR, Est African American  Date Value Ref Range Status  08/05/2017 88 > OR = 60 mL/min/1.32m2 Final   GFR calc Af Amer  Date Value Ref Range Status  08/12/2020 80 >59 mL/min/1.73 Final    Comment:    **In accordance with recommendations from the NKF-ASN Task force,**   [facility] is in the process of updating its eGFR calculation to the   2021 CKD-EPI creatinine equation that estimates kidney function   without a race variable.    GFR, Est Non African American  Date Value Ref Range Status  08/05/2017 76 > OR = 60 mL/min/1.35m2 Final   GFR calc non Af Amer  Date Value Ref Range Status  08/12/2020 70 >59 mL/min/1.73 Final   eGFR  Date Value Ref Range Status  11/07/2022 76 >59 mL/min/1.73 Final         Passed - Patient is not  pregnant      Passed - Valid encounter within last 6 months    Recent Outpatient Visits           5 months ago Encounter for [employer] annual wellness exam   [place] [facility] [place], Marzella Schlein, MD   11 months ago Essential hypertension   [place] [facility] [place], Marzella Schlein, MD   1 year ago Encounter for Medicare annual wellness exam   [place] [facility] [place], Marzella Schlein, MD   1 year ago Essential hypertension   [place] [facility] [place], Marzella Schlein, MD   2 years ago Acute cystitis with hematuria   [place] [facility] [place], Eula Fried, FNP       Future Appointments             In 1 month [place], Marzella Schlein, MD [facility]

## 2023-02-09 ENCOUNTER — Emergency Department: Payer: Medicare Other

## 2023-02-09 ENCOUNTER — Inpatient Hospital Stay
Admission: EM | Admit: 2023-02-09 | Discharge: 2023-02-13 | DRG: 522 | Disposition: A | Discharge status: Skilled Nursing Facility | Payer: Medicare Other | Attending: Internal Medicine | Admitting: Internal Medicine

## 2023-02-09 ENCOUNTER — Other Ambulatory Visit: Payer: Self-pay

## 2023-02-09 ENCOUNTER — Encounter: Payer: Self-pay | Admitting: Internal Medicine

## 2023-02-09 DIAGNOSIS — Z7901 Long term (current) use of anticoagulants: Secondary | ICD-10-CM | POA: Diagnosis not present

## 2023-02-09 DIAGNOSIS — I071 Rheumatic tricuspid insufficiency: Secondary | ICD-10-CM | POA: Diagnosis present

## 2023-02-09 DIAGNOSIS — Z79899 Other long term (current) drug therapy: Secondary | ICD-10-CM

## 2023-02-09 DIAGNOSIS — J449 Chronic obstructive pulmonary disease, unspecified: Secondary | ICD-10-CM | POA: Diagnosis not present

## 2023-02-09 DIAGNOSIS — Z01818 Encounter for other preprocedural examination: Secondary | ICD-10-CM | POA: Diagnosis not present

## 2023-02-09 DIAGNOSIS — Z471 Aftercare following joint replacement surgery: Secondary | ICD-10-CM | POA: Diagnosis not present

## 2023-02-09 DIAGNOSIS — Z888 Allergy status to other drugs, medicaments and biological substances status: Secondary | ICD-10-CM

## 2023-02-09 DIAGNOSIS — S41001A Unspecified open wound of right shoulder, initial encounter: Secondary | ICD-10-CM | POA: Diagnosis not present

## 2023-02-09 DIAGNOSIS — I959 Hypotension, unspecified: Secondary | ICD-10-CM

## 2023-02-09 DIAGNOSIS — I4819 Other persistent atrial fibrillation: Secondary | ICD-10-CM

## 2023-02-09 DIAGNOSIS — S72001S Fracture of unspecified part of neck of right femur, sequela: Secondary | ICD-10-CM | POA: Diagnosis not present

## 2023-02-09 DIAGNOSIS — S51011A Laceration without foreign body of right elbow, initial encounter: Secondary | ICD-10-CM | POA: Diagnosis not present

## 2023-02-09 DIAGNOSIS — Z801 Family history of malignant neoplasm of trachea, bronchus and lung: Secondary | ICD-10-CM

## 2023-02-09 DIAGNOSIS — I10 Essential (primary) hypertension: Secondary | ICD-10-CM | POA: Diagnosis not present

## 2023-02-09 DIAGNOSIS — S42141D Displaced fracture of glenoid cavity of scapula, right shoulder, subsequent encounter for fracture with routine healing: Secondary | ICD-10-CM | POA: Diagnosis not present

## 2023-02-09 DIAGNOSIS — Y9364 Activity, baseball: Secondary | ICD-10-CM | POA: Diagnosis not present

## 2023-02-09 DIAGNOSIS — Z833 Family history of diabetes mellitus: Secondary | ICD-10-CM | POA: Diagnosis not present

## 2023-02-09 DIAGNOSIS — J4489 Other specified chronic obstructive pulmonary disease: Secondary | ICD-10-CM | POA: Diagnosis present

## 2023-02-09 DIAGNOSIS — Z87891 Personal history of nicotine dependence: Secondary | ICD-10-CM

## 2023-02-09 DIAGNOSIS — J8410 Pulmonary fibrosis, unspecified: Secondary | ICD-10-CM | POA: Diagnosis not present

## 2023-02-09 DIAGNOSIS — W19XXXA Unspecified fall, initial encounter: Secondary | ICD-10-CM | POA: Diagnosis not present

## 2023-02-09 DIAGNOSIS — W1830XA Fall on same level, unspecified, initial encounter: Secondary | ICD-10-CM | POA: Diagnosis not present

## 2023-02-09 DIAGNOSIS — S72001D Fracture of unspecified part of neck of right femur, subsequent encounter for closed fracture with routine healing: Secondary | ICD-10-CM | POA: Diagnosis not present

## 2023-02-09 DIAGNOSIS — K5909 Other constipation: Secondary | ICD-10-CM | POA: Diagnosis not present

## 2023-02-09 DIAGNOSIS — M80012A Age-related osteoporosis with current pathological fracture, left shoulder, initial encounter for fracture: Secondary | ICD-10-CM | POA: Diagnosis present

## 2023-02-09 DIAGNOSIS — M25521 Pain in right elbow: Secondary | ICD-10-CM | POA: Diagnosis not present

## 2023-02-09 DIAGNOSIS — W010XXA Fall on same level from slipping, tripping and stumbling without subsequent striking against object, initial encounter: Secondary | ICD-10-CM | POA: Diagnosis present

## 2023-02-09 DIAGNOSIS — R69 Illness, unspecified: Secondary | ICD-10-CM | POA: Diagnosis not present

## 2023-02-09 DIAGNOSIS — I4891 Unspecified atrial fibrillation: Secondary | ICD-10-CM | POA: Diagnosis not present

## 2023-02-09 DIAGNOSIS — K5900 Constipation, unspecified: Secondary | ICD-10-CM | POA: Diagnosis not present

## 2023-02-09 DIAGNOSIS — Z823 Family history of stroke: Secondary | ICD-10-CM

## 2023-02-09 DIAGNOSIS — Z8249 Family history of ischemic heart disease and other diseases of the circulatory system: Secondary | ICD-10-CM | POA: Diagnosis not present

## 2023-02-09 DIAGNOSIS — J309 Allergic rhinitis, unspecified: Secondary | ICD-10-CM | POA: Diagnosis not present

## 2023-02-09 DIAGNOSIS — Z825 Family history of asthma and other chronic lower respiratory diseases: Secondary | ICD-10-CM | POA: Diagnosis not present

## 2023-02-09 DIAGNOSIS — S299XXA Unspecified injury of thorax, initial encounter: Secondary | ICD-10-CM | POA: Diagnosis not present

## 2023-02-09 DIAGNOSIS — Z01810 Encounter for preprocedural cardiovascular examination: Secondary | ICD-10-CM | POA: Diagnosis not present

## 2023-02-09 DIAGNOSIS — Z96641 Presence of right artificial hip joint: Secondary | ICD-10-CM | POA: Diagnosis not present

## 2023-02-09 DIAGNOSIS — J84112 Idiopathic pulmonary fibrosis: Secondary | ICD-10-CM | POA: Diagnosis present

## 2023-02-09 DIAGNOSIS — S72001A Fracture of unspecified part of neck of right femur, initial encounter for closed fracture: Secondary | ICD-10-CM | POA: Diagnosis present

## 2023-02-09 DIAGNOSIS — R609 Edema, unspecified: Secondary | ICD-10-CM | POA: Diagnosis not present

## 2023-02-09 DIAGNOSIS — M25519 Pain in unspecified shoulder: Secondary | ICD-10-CM | POA: Diagnosis not present

## 2023-02-09 DIAGNOSIS — S42141A Displaced fracture of glenoid cavity of scapula, right shoulder, initial encounter for closed fracture: Secondary | ICD-10-CM | POA: Diagnosis not present

## 2023-02-09 DIAGNOSIS — M80051A Age-related osteoporosis with current pathological fracture, right femur, initial encounter for fracture: Principal | ICD-10-CM | POA: Diagnosis present

## 2023-02-09 DIAGNOSIS — M810 Age-related osteoporosis without current pathological fracture: Secondary | ICD-10-CM | POA: Diagnosis not present

## 2023-02-09 DIAGNOSIS — M1990 Unspecified osteoarthritis, unspecified site: Secondary | ICD-10-CM | POA: Diagnosis not present

## 2023-02-09 DIAGNOSIS — N390 Urinary tract infection, site not specified: Secondary | ICD-10-CM | POA: Diagnosis not present

## 2023-02-09 DIAGNOSIS — Z043 Encounter for examination and observation following other accident: Secondary | ICD-10-CM | POA: Diagnosis not present

## 2023-02-09 DIAGNOSIS — W19XXXD Unspecified fall, subsequent encounter: Secondary | ICD-10-CM | POA: Diagnosis not present

## 2023-02-09 DIAGNOSIS — W19XXXS Unspecified fall, sequela: Secondary | ICD-10-CM | POA: Diagnosis not present

## 2023-02-09 DIAGNOSIS — I9589 Other hypotension: Secondary | ICD-10-CM | POA: Diagnosis not present

## 2023-02-09 DIAGNOSIS — S72041A Displaced fracture of base of neck of right femur, initial encounter for closed fracture: Secondary | ICD-10-CM | POA: Diagnosis not present

## 2023-02-09 HISTORY — DX: Fracture of unspecified part of neck of right femur, initial encounter for closed fracture: S72.001A

## 2023-02-09 LAB — CBC WITH DIFFERENTIAL/PLATELET
Abs Immature Granulocytes: 0.04 10*3/uL (ref 0.00–0.07)
Basophils Absolute: 0 10*3/uL (ref 0.0–0.1)
Basophils Relative: 0 %
Eosinophils Absolute: 0 10*3/uL (ref 0.0–0.5)
Eosinophils Relative: 0 %
HCT: 41.6 % (ref 36.0–46.0)
Hemoglobin: 13.6 g/dL (ref 12.0–15.0)
Immature Granulocytes: 0 %
Lymphocytes Relative: 13 %
Lymphs Abs: 1.2 10*3/uL (ref 0.7–4.0)
MCH: 31.3 pg (ref 26.0–34.0)
MCHC: 32.7 g/dL (ref 30.0–36.0)
MCV: 95.9 fL (ref 80.0–100.0)
Monocytes Absolute: 0.7 10*3/uL (ref 0.1–1.0)
Monocytes Relative: 7 %
Neutro Abs: 7.2 10*3/uL (ref 1.7–7.7)
Neutrophils Relative %: 80 %
Platelets: 262 10*3/uL (ref 150–400)
RBC: 4.34 MIL/uL (ref 3.87–5.11)
RDW: 14.1 % (ref 11.5–15.5)
WBC: 9.2 10*3/uL (ref 4.0–10.5)
nRBC: 0 % (ref 0.0–0.2)

## 2023-02-09 LAB — BASIC METABOLIC PANEL
Anion gap: 7 (ref 5–15)
BUN: 18 mg/dL (ref 8–23)
CO2: 26 mmol/L (ref 22–32)
Calcium: 9 mg/dL (ref 8.9–10.3)
Chloride: 100 mmol/L (ref 98–111)
Creatinine, Ser: 0.78 mg/dL (ref 0.44–1.00)
GFR, Estimated: >60 mL/min (ref >60)
Glucose, Bld: 121 mg/dL — ABNORMAL HIGH (ref 70–99)
Potassium: 3.9 mmol/L (ref 3.5–5.1)
Sodium: 133 mmol/L — ABNORMAL LOW (ref 135–145)

## 2023-02-09 MED ORDER — SODIUM CHLORIDE 0.9 % IV SOLN: Freq: Once | INTRAVENOUS | Status: AC

## 2023-02-09 MED ORDER — HYDROMORPHONE HCL 1 MG/ML IJ SOLN
0.5000 mg | INTRAMUSCULAR | Status: DC | PRN
  Administered 2023-02-09 – 2023-02-10 (×4): 1 mg via INTRAVENOUS
  Filled 2023-02-09 (×4): qty 1

## 2023-02-09 MED ORDER — HYDROCODONE-ACETAMINOPHEN 5-325 MG PO TABS
1.0000 | ORAL_TABLET | Freq: Four times a day (QID) | ORAL | Status: DC | PRN
  Administered 2023-02-09: 1 via ORAL
  Filled 2023-02-09: qty 1

## 2023-02-09 MED ORDER — METOPROLOL TARTRATE 25 MG PO TABS
25.0000 mg | ORAL_TABLET | Freq: Two times a day (BID) | ORAL | Status: DC
  Administered 2023-02-09: 25 mg via ORAL
  Filled 2023-02-09: qty 1

## 2023-02-09 MED ORDER — LOSARTAN POTASSIUM 50 MG PO TABS: 50.0000 mg | ORAL_TABLET | Freq: Every day | ORAL | Status: DC

## 2023-02-09 MED ORDER — MONTELUKAST SODIUM 10 MG PO TABS
10.0000 mg | ORAL_TABLET | Freq: Every day | ORAL | Status: DC
  Administered 2023-02-09 – 2023-02-12 (×4): 10 mg via ORAL
  Filled 2023-02-09 (×4): qty 1

## 2023-02-09 MED ORDER — HEPARIN SODIUM (PORCINE) 5000 UNIT/ML IJ SOLN
5000.0000 [IU] | Freq: Three times a day (TID) | INTRAMUSCULAR | Status: AC
  Administered 2023-02-09: 5000 [IU] via SUBCUTANEOUS
  Filled 2023-02-09: qty 1

## 2023-02-09 MED ORDER — MORPHINE SULFATE (PF) 2 MG/ML IV SOLN
2.0000 mg | Freq: Once | INTRAVENOUS | Status: AC
  Administered 2023-02-09: 2 mg via INTRAVENOUS
  Filled 2023-02-09: qty 1

## 2023-02-09 MED ORDER — HYDROCHLOROTHIAZIDE 12.5 MG PO TABS: 12.5000 mg | ORAL_TABLET | Freq: Every day | ORAL | Status: DC

## 2023-02-09 MED ORDER — ONDANSETRON HCL 4 MG/2ML IJ SOLN: 4.0000 mg | Freq: Four times a day (QID) | INTRAMUSCULAR | Status: DC | PRN

## 2023-02-09 MED ORDER — LOSARTAN POTASSIUM-HCTZ 50-12.5 MG PO TABS: 1.0000 | ORAL_TABLET | Freq: Every day | ORAL | Status: DC

## 2023-02-09 MED ORDER — ONDANSETRON HCL 4 MG/2ML IJ SOLN
4.0000 mg | Freq: Once | INTRAMUSCULAR | Status: AC
  Administered 2023-02-09: 4 mg via INTRAVENOUS
  Filled 2023-02-09: qty 2

## 2023-02-09 MED ORDER — MORPHINE SULFATE (PF) 4 MG/ML IV SOLN
4.0000 mg | Freq: Once | INTRAVENOUS | Status: AC
  Administered 2023-02-09: 4 mg via INTRAVENOUS
  Filled 2023-02-09: qty 1

## 2023-02-09 MED ORDER — POLYETHYLENE GLYCOL 3350 17 G PO PACK
17.0000 g | PACK | Freq: Every day | ORAL | Status: DC | PRN
  Administered 2023-02-11: 17 g via ORAL
  Filled 2023-02-09: qty 1

## 2023-02-09 NOTE — Assessment & Plan Note (Addendum)
Right hip hemiarthroplasty on 5/12 by Dr. Allena Katz.  Pain control.  PT and OT recommending rehab

## 2023-02-09 NOTE — ED Provider Notes (Signed)
[facility] Provider Note    Event Date/Time   First MD Initiated Contact with Patient 02/09/23 1505     (approximate)   History   Fall, Hip Pain, and Shoulder Pain   HPI  Karen Henson is a 82 y.o. female who presents to the emergency department today complaining of right hip, shoulder and elbow pain after a fall.  Patient was pain T-ball with her great grandchild.  She tripped over her own legs and came down on that right side.  Denies any head injury.  Denies any recent illness.     Physical Exam   Triage Vital Signs: ED Triage Vitals  Enc Vitals Group     BP 02/09/23 1347 (!) 142/103     Pulse Rate 02/09/23 1347 85     Resp 02/09/23 1347 16     Temp 02/09/23 1347 98.3 F (36.8 C)     Temp Source 02/09/23 1347 Oral     SpO2 02/09/23 1347 97 %     Weight 02/09/23 1345 130 lb 1.1 oz (59 kg)     Height 02/09/23 1345 5\' 1"  (1.549 m)     Head Circumference --      Peak Flow --      Pain Score 02/09/23 1345 8     Pain Loc --      Pain Edu? --      Excl. in GC? --     Most recent vital signs: Vitals:   02/09/23 1347 02/09/23 1455  BP: (!) 142/103 (!) 156/104  Pulse: 85 61  Resp: 16 16  Temp: 98.3 F (36.8 C)   SpO2: 97% 96%   General: Awake, alert, oriented. CV:  Good peripheral perfusion. Regular rate and rhythm. Resp:  Normal effort. Lungs clear. Abd:  No distention.  Other:  Right leg shortened and externally rotated. NV intact. Right elbow with bruising and swelling.   ED Results / Procedures / Treatments   Labs (all labs ordered are listed, but only abnormal results are displayed) Labs Reviewed  BASIC METABOLIC PANEL - Abnormal; Notable for the following components:      Result Value   Sodium 133 (*)    Glucose, Bld 121 (*)    All other components within normal limits  CBC WITH DIFFERENTIAL/PLATELET     EKG  I, Phineas Semen, attending physician, personally viewed and interpreted this EKG  EKG Time: 1526 Rate:  115 Rhythm: atrial fibrillation Axis: left axis deviation Intervals: qtc 415 QRS: narrow, q wave q1 ST changes: no st elevation Impression: abnormal ekg   RADIOLOGY I independently interpreted and visualized the Right elbow. My interpretation: No fracture/dislocation Radiology interpretation:  IMPRESSION:  No fracture or dislocation of the right elbow.    I independently interpreted and visualized the right hip. My interpretation: right hip fracture Radiology interpretation:  IMPRESSION:  1. Right femoral neck fracture with varus angulation.  2. Lucency in the femoral head raises the question of pathologic  fracture.    I independently interpreted and visualized the right shoulder. My interpretation: no fracture/dislocation Radiology interpretation:  IMPRESSION:  1. Linear lucencies through the inferior glenoid which may be  secondary to degenerative change; however, acute nondisplaced  fractures not excluded.  2. Osteopenia.    I independently interpreted and visualized the CXR. My interpretation: no pneumonia Radiology interpretation:  IMPRESSION:  Cardiomegaly without acute cardiopulmonary process.      PROCEDURES:  Critical Care performed: No    MEDICATIONS ORDERED IN  ED: Medications  ondansetron (ZOFRAN) injection 4 mg (4 mg Intravenous Given 02/09/23 1507)  morphine (PF) 4 MG/ML injection 4 mg (4 mg Intravenous Given 02/09/23 1508)     IMPRESSION / MDM / ASSESSMENT AND PLAN / ED COURSE  I reviewed the triage vital signs and the nursing notes.                              Differential diagnosis includes, but is not limited to, hip/shoulder/elbow fracture, dislocation, contusion  Patient's presentation is most consistent with acute presentation with potential threat to life or bodily function.   The patient is on the cardiac monitor to evaluate for evidence of arrhythmia and/or significant heart rate changes.   Patient presented to the emergency  department today because of concerns for right-sided pain after a fall.  On exam patient's right leg is shortened and externally rotated.  X-rays consistent with fracture.  Also complaining of pain to the right elbow and shoulder.  Right elbow without concerning findings, right shoulder with questionable fracture.  Discussed with Dr. Allena Katz with orthopedics.  He did recommend CT scan of the hip and shoulder to better evaluate fractures.  Additionally patient was found to be in A-fib here on the cardiac monitor.  EKG confirms this.  Patient without any history of A-fib.  Discussed with Dr. Huel Cote with the hospitalist service who will plan on admission.     FINAL CLINICAL IMPRESSION(S) / ED DIAGNOSES   Final diagnoses:  Fall, initial encounter  Closed fracture of right hip, initial encounter [facility])   Note:  This document was prepared using Dragon voice recognition software and may include unintentional dictation errors.    Phineas Semen, MD 02/09/23 [phone]

## 2023-02-09 NOTE — H&P (Addendum)
History and Physical    Patient: Karen Henson:[id number] DOB: 1941-07-23 DOA: 02/09/2023 DOS: the patient was seen and examined on 02/09/2023 PCP: Erasmo Downer, MD  Patient coming from: Home  Chief Complaint:  Chief Complaint  Patient presents with   Fall   Hip Pain   Shoulder Pain   HPI: Karen Henson is a 82 y.o. female with medical history significant of COPD, hypertension, idiopathic pulmonary fibrosis, osteoporosis, who presents to the ED due to a ground-level fall.  Mrs. Grapes states that she was outside playing T-ball with her grandson when she tripped over her own feet and landed on the cement on her right hip and right shoulder.  Prior to the fall, she states she was in her normal state of health with no dizziness, palpitations, chest pain or shortness of breath.  After the fall, she denies any palpitations chest pain or shortness of breath.  She is experiencing severe right hip pain in addition to right elbow and shoulder pain.  She denies any headache, or loss of consciousness.  Mrs. Peron endorses chronic dyspnea on exertion that is unchanged in nature.  ED course: On arrival to the ED, patient was hypertensive at 142/103 with heart rate of 85.  Her heart rate subsequently increased to 122.  She was afebrile 98.3.  She was saturating at 97% on room air.  Initial workup notable for normal CBC, and BMP with sodium of 133, glucose of 121 and GFR above 60. EKG was obtained that demonstrated atrial fibrillation with RVR.  Chest x-ray was obtained that demonstrated cardiomegaly without acute cardiopulmonary process.  Right shoulder x-ray was obtained with no fracture or dislocation but evidence of soft tissue swelling.  Right shoulder x-ray was obtained with linear lucencies possibly due to degenerative disease versus fracture.  Right hip x-ray was obtained that demonstrated right femoral neck fracture with varus angulation.  Orthopedic surgery consulted.  [facility] contacted for  admission.  Review of Systems: As mentioned in the history of present illness. All other systems reviewed and are negative.  Past Medical History:  Diagnosis Date   Allergy    Environmental Allergies   Arthritis    Bronchitis, chronic (HCC)    COPD (chronic obstructive pulmonary disease) (HCC)    Cough    CHRONIC   Dyspnea    OCCAS   Hypertension    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HCC)    Osteoporosis    Past Surgical History:  Procedure Laterality Date   BREAST EXCISIONAL BIOPSY Left 1996   neg   BREAST SURGERY     CAPSULOTOMY     CATARACT EXTRACTION W/PHACO Left 10/31/2017   Procedure: CATARACT EXTRACTION PHACO AND INTRAOCULAR LENS PLACEMENT (IOC);  Surgeon: Nevada Crane, MD;  Location: [facility] ORS;  Service: Ophthalmology;  Laterality: Left;  [place] 00:43.7 AP% 19.6 CDE 8.57 FLUID PACK LOT # [id number]   CATARACT EXTRACTION W/PHACO Right 12/05/2017   Procedure: CATARACT EXTRACTION PHACO AND INTRAOCULAR LENS PLACEMENT (IOC);  Surgeon: Nevada Crane, MD;  Location: [facility] ORS;  Service: Ophthalmology;  Laterality: Right;  fluid pack lot #[id number] H  exp 07/01/2019 [place]    00:31.4 AP%    8.8 CDE    2.79   COLONOSCOPY  02/15/2006   COLONOSCOPY WITH PROPOFOL N/A 03/21/2016   Procedure: COLONOSCOPY WITH PROPOFOL;  Surgeon: Earline Mayotte, MD;  Location: [facility] ENDOSCOPY;  Service: Endoscopy;  Laterality: N/A;   COLONOSCOPY WITH PROPOFOL N/A 03/29/2021   Procedure: COLONOSCOPY WITH PROPOFOL;  Surgeon: Donnalee Curry  W, MD;  Location: [facility] ENDOSCOPY;  Service: Endoscopy;  Laterality: N/A;   DILATION AND CURETTAGE OF UTERUS     EYE SURGERY     VARICOSE VEIN SURGERY     Social History:  reports that she has quit smoking. Her smoking use included cigarettes. She smoked an average of .25 packs per day. She has never used smokeless tobacco. She reports current alcohol use. She reports that she does not use drugs.  Allergies  Allergen Reactions   Lisinopril Cough    Family History  Problem  Relation Age of Onset   Hypertension Mother    Emphysema Father    Stroke Sister    Stroke Brother    Diabetes Maternal Aunt    Diabetes Maternal Grandmother    Lung cancer Grandchild    Breast cancer Neg Hx    Ovarian cancer Neg Hx    Colon cancer Neg Hx     Prior to Admission medications   Medication Sig Start Date End Date Taking? Authorizing Provider  acetaminophen (TYLENOL) 500 MG tablet Take 500 mg by mouth every 6 (six) hours as needed for moderate pain or headache.    [provider]  albuterol (VENTOLIN HFA) 108 (90 Base) MCG/ACT inhaler Inhale 2 puffs into the lungs every 6 (six) hours as needed for wheezing or shortness of breath.    [provider]  benzonatate (TESSALON) 200 MG capsule Take 1 capsule (200 mg total) by mouth 2 (two) times daily as needed for cough. 10/24/21   Sallyanne Kuster, NP  Budeson-Glycopyrrol-Formoterol (BREZTRI AEROSPHERE) 160-9-4.8 MCG/ACT AERO Inhale 2 puffs into the lungs 2 (two) times daily. 11/23/21   Sallyanne Kuster, NP  chlorpheniramine-HYDROcodone (TUSSIONEX PENNKINETIC ER) 10-8 MG/5ML Take 5 mLs by mouth every 12 (twelve) hours as needed for cough. 10/24/21   Sallyanne Kuster, NP  dextromethorphan-guaiFENesin (MUCINEX DM) 30-600 MG 12hr tablet Take 1 tablet by mouth daily.    [provider]  loratadine (CLARITIN) 10 MG tablet Take 10 mg by mouth daily.     [provider]  losartan-hydrochlorothiazide (HYZAAR) 50-12.5 MG tablet TAKE 1 TABLET BY MOUTH ONCE DAILY 02/04/23   Bacigalupo, Marzella Schlein, MD  montelukast (SINGULAIR) 10 MG tablet TAKE 1 TABLET BY MOUTH BEDTIME FOR ASTHMA 06/12/22   Sallyanne Kuster, NP  predniSONE (DELTASONE) 10 MG tablet Take one tab 3 x day for 3 days, then take one tab 2 x a day for 3 days and then take one tab a day for 3 days for copd 09/26/21   Sallyanne Kuster, NP    Physical Exam: Vitals:   02/09/23 1347 02/09/23 1455 02/09/23 1500 02/09/23 1600  BP: (!) 142/103 (!) 156/104  (!) 140/99 (!) 129/93  Pulse: 85 61 87 89  Resp: 16 16 14 16   Temp: 98.3 F (36.8 C)  98.4 F (36.9 C)   TempSrc: Oral  Oral   SpO2: 97% 96% 97% 100%  Weight:      Height:       Physical Exam Vitals and nursing note reviewed.  Constitutional:      Appearance: She is normal weight.  HENT:     Head: Normocephalic and atraumatic.     Mouth/Throat:     Mouth: Mucous membranes are moist.     Pharynx: Oropharynx is clear.  Eyes:     Conjunctiva/sclera: Conjunctivae normal.     Pupils: Pupils are equal, round, and reactive to light.  Cardiovascular:     Rate and Rhythm: Tachycardia present. Rhythm irregularly  irregular.     Heart sounds: Normal heart sounds.  Pulmonary:     Effort: Pulmonary effort is normal. No respiratory distress.     Breath sounds: Rales (Left-sided predominantly) present. No wheezing.     Comments: Pulmonary exam limited as patient is unable to sit forward due to right hip pain Musculoskeletal:     Right lower leg: No edema.     Left lower leg: No edema.     Comments: Right lower extremity externally rotated and shortened.  Right shoulder with no tenderness to palpation, however difficulty with abduction due to pain.  Skin:    General: Skin is warm and dry.     Comments: Large hematoma lateral to the right elbow with mild tenderness to palpation.  Abrasion to the left elbow with some bleeding that is dried  Neurological:     Mental Status: She is alert and oriented to person, place, and time. Mental status is at baseline.  Psychiatric:        Mood and Affect: Mood normal.        Behavior: Behavior normal.    Data Reviewed: CBC with WBC of 9.2, hemoglobin 13.6, MCV of 95 and platelets of 260 CMP with sodium of 133, potassium 3.9, bicarb 26, glucose 121, BUN 18, creatinine 0.78 and GFR above 60  EKG personally reviewed.  Atrial fibrillation with rate of 115.  ST depression in leads V4, V5 and V6, however depression less than 2 mm.  DG Hip Unilat W or Wo  Pelvis 2-3 Views Right  Result Date: 02/09/2023 CLINICAL DATA:  Fall. EXAM: DG HIP (WITH OR WITHOUT PELVIS) 2-3V RIGHT COMPARISON:  None Available. FINDINGS: Bones are diffusely demineralized. SI joints and symphysis pubis unremarkable. Right femoral neck fracture with varus angulation. Fracture line is not well demonstrated but is probably transcervical to basicervical. There is some lucency in the humeral head raising the question of pathologic fracture. IMPRESSION: 1. Right femoral neck fracture with varus angulation. 2. Lucency in the femoral head raises the question of pathologic fracture. Electronically Signed   By: Kennith Center M.D.   On: 02/09/2023 15:27   DG Shoulder Right  Result Date: 02/09/2023 CLINICAL DATA:  Fall EXAM: RIGHT SHOULDER - 2+ VIEW COMPARISON:  None Available. FINDINGS: The bones are osteopenic. There is some linear lucencies through the inferior glenoid which may be secondary to degenerative change; however, acute nondisplaced fractures not excluded. Joint spaces are well maintained and alignment is anatomic. There are minimal degenerative changes of the acromioclavicular joint. The soft tissues are within normal limits. IMPRESSION: 1. Linear lucencies through the inferior glenoid which may be secondary to degenerative change; however, acute nondisplaced fractures not excluded. 2. Osteopenia. Electronically Signed   By: Darliss Cheney M.D.   On: 02/09/2023 15:26   DG Elbow Complete Right  Result Date: 02/09/2023 CLINICAL DATA:  Fall.  Hematoma noted to right elbow. EXAM: RIGHT ELBOW - COMPLETE 3+ VIEW COMPARISON:  None Available. FINDINGS: There is no evidence of fracture, dislocation, or joint effusion. No focal bone abnormality. Soft tissue swelling is noted overlying the posterior aspect of the elbow/proximal forearm. IMPRESSION: No fracture or dislocation of the right elbow. Electronically Signed   By: Sherron Ales M.D.   On: 02/09/2023 15:25   DG Chest 1 View  Result  Date: 02/09/2023 CLINICAL DATA:  Injury EXAM: CHEST  1 VIEW COMPARISON:  Chest x-ray 10/23/2021 FINDINGS: The heart is enlarged, unchanged. The lungs are clear. There is no pleural effusion or  pneumothorax. No acute fractures are identified. IMPRESSION: Cardiomegaly without acute cardiopulmonary process. Electronically Signed   By: Darliss Cheney M.D.   On: 02/09/2023 15:24    Results are pending, will review when available.  Assessment and Plan:  * Closed right hip fracture [facility]) Patient presenting with a ground-level fall when running outside and playing with her grandson.  Hip x-ray with evidence of right femoral neck fracture.  - Orthopedic surgery consulted; appreciate their recommendations - Scheduled for repair on 5/12 - N.p.o. after midnight - CT of the right hip pending - Norco and Dilaudid for pain control  Atrial fibrillation with RVR (HCC) New onset atrial fibrillation with intermittent episodes of RVR.  No prior history.  Given rates are relatively well-controlled, will start with p.o. metoprolol.  CHA2DS2-VASc elevated at 4 (Age, sex, history of hypertension)  - Telemetry monitoring - Start metoprolol 25 mg twice daily - Echocardiogram ordered - Will need to have a discussion regarding chronic anticoagulation prior to discharge  Ground-level fall Mechanical ground-level fall with right shoulder pain, right elbow pain and right hip pain.  Possible fracture noted on right shoulder x-ray, CT is pending.  No fracture of the elbow, however overlying hematoma.  Management of right hip fracture as noted above.  - CT of the right shoulder pending - Pain control with Norco and Dilaudid - PT/OT after operation tomorrow  Essential hypertension - Continue home Hyzaar  COPD (chronic obstructive pulmonary disease) (HCC) Pulmonary examination reassuring today.  Patient states she does not have a history of COPD, however this is documented by her pulmonary physician.  Per pharmacy  records, patient has not picked up [facility] recently.   - DuoNebs as needed for shortness of breath and wheezing - Continue home montelukast at bedtime  Fibrosis lung (HCC) History of IPF with no prior exacerbations.  Not currently on antifibrotic therapy.  Advance Care Planning:   Code Status: Full Code verified by patient with daughter and granddaughter at bedside  Consults: Orthopedic surgery  Family Communication: Patient's daughter and grand daughter updated at bedside  Severity of Illness: The appropriate patient status for this patient is INPATIENT. Inpatient status is judged to be reasonable and necessary in order to provide the required intensity of service to ensure the patient's safety. The patient's presenting symptoms, physical exam findings, and initial radiographic and laboratory data in the context of their chronic comorbidities is felt to place them at high risk for further clinical deterioration. Furthermore, it is not anticipated that the patient will be medically stable for discharge from the hospital within 2 midnights of admission.   * I certify that at the point of admission it is my clinical judgment that the patient will require inpatient hospital care spanning beyond 2 midnights from the point of admission due to high intensity of service, high risk for further deterioration and high frequency of surveillance required.*  Author: Verdene Lennert, MD 02/09/2023 5:17 PM  For on call review [email or web address].

## 2023-02-09 NOTE — Assessment & Plan Note (Addendum)
Low-dose Toprol-XL at night if blood pressure allows.

## 2023-02-09 NOTE — ED Notes (Signed)
Called to floor to speak w/ rec RN on handoff who was busy with another pt. Verbal report given to charge RN but hand off not completed by charge, rather he stated the primary RN will complete the handoff when free to do so.

## 2023-02-09 NOTE — Assessment & Plan Note (Addendum)
History of IPF with no prior exacerbations.

## 2023-02-09 NOTE — Assessment & Plan Note (Signed)
Mechanical ground-level fall with right shoulder pain, right elbow pain and right hip pain.  Possible fracture noted on right shoulder x-ray, CT is pending.  No fracture of the elbow, however overlying hematoma.  Management of right hip fracture as noted above.  - CT of the right shoulder pending - Pain control with Norco and Dilaudid - PT/OT after operation tomorrow

## 2023-02-09 NOTE — ED Notes (Signed)
ED Provider at bedside. 

## 2023-02-09 NOTE — Assessment & Plan Note (Addendum)
Pulmonary examination reassuring today.  Patient states she does not have a history of COPD, however this is documented by her pulmonary physician.  Per pharmacy records, patient has not picked up [facility] recently.   - DuoNebs as needed for shortness of breath and wheezing - Continue home montelukast at bedtime

## 2023-02-09 NOTE — Assessment & Plan Note (Signed)
Rate controlled on low-dose Toprol-XL at night.  Orthopedic surgery gave the okay to start Eliquis.  Will start this evening since patient received a Lovenox injection this morning.

## 2023-02-09 NOTE — ED Triage Notes (Signed)
Pt reports was playing outside with her great grandson and got tangled over her feet and fell. Pt c/o pain to right hip, right elbow and right shoulder. Denies LOC, does not take blood thinners. Hematoma noted to right elbow.

## 2023-02-09 NOTE — ED Notes (Signed)
Hospital provider at bedside

## 2023-02-09 NOTE — Anesthesia Preprocedure Evaluation (Signed)
Anesthesia Evaluation  Patient identified by MRN, date of birth, ID band Patient awake    Reviewed: Allergy & Precautions, H&P , NPO status , Patient's Chart, lab work & pertinent test results  Airway Mallampati: II  TM Distance: >3 FB Neck ROM: full    Dental  (+) Chipped   Pulmonary shortness of breath, COPD, former smoker Lung fibrosis   Pulmonary exam normal        Cardiovascular hypertension, + dysrhythmias Atrial Fibrillation  Rhythm:Irregular Rate:Normal - Systolic murmurs, - Diastolic murmurs and - Peripheral Edema    Neuro/Psych negative neurological ROS  negative psych ROS   GI/Hepatic negative GI ROS, Neg liver ROS,,,  Endo/Other  negative endocrine ROS    Renal/GU      Musculoskeletal  (+) Arthritis ,    Abdominal Normal abdominal exam  (+)   Peds  Hematology negative hematology ROS (+)   Anesthesia Other Findings Past Medical History: No date: Allergy     Comment:  Environmental Allergies No date: Arthritis No date: Bronchitis, chronic (HCC) No date: COPD (chronic obstructive pulmonary disease) (HCC) No date: Cough     Comment:  CHRONIC No date: Dyspnea     Comment:  OCCAS No date: Hypertension No date: 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HCC) No date: Osteoporosis  Past Surgical History: 1996: BREAST EXCISIONAL BIOPSY; Left     Comment:  neg No date: BREAST SURGERY No date: CAPSULOTOMY 10/31/2017: CATARACT EXTRACTION W/PHACO; Left     Comment:  Procedure: CATARACT EXTRACTION PHACO AND INTRAOCULAR               LENS PLACEMENT (IOC);  Surgeon: Nevada Crane, MD;                Location: [facility] ORS;  Service: Ophthalmology;  Laterality:              Left;  [place] 00:43.7 AP% 19.6 CDE 8.57 FLUID PACK LOT #               [id number] 12/05/2017: CATARACT EXTRACTION W/PHACO; Right     Comment:  Procedure: CATARACT EXTRACTION PHACO AND INTRAOCULAR               LENS PLACEMENT (IOC);  Surgeon: Nevada Crane,  MD;                Location: [facility] ORS;  Service: Ophthalmology;  Laterality:              Right;  fluid pack lot #[id number] H  exp 07/01/2019 [place]                  00:31.4 AP%    8.8 CDE    2.79 02/15/2006: COLONOSCOPY 03/21/2016: COLONOSCOPY WITH PROPOFOL; N/A     Comment:  Procedure: COLONOSCOPY WITH PROPOFOL;  Surgeon: Earline Mayotte, MD;  Location: [facility] ENDOSCOPY;  Service:               Endoscopy;  Laterality: N/A; 03/29/2021: COLONOSCOPY WITH PROPOFOL; N/A     Comment:  Procedure: COLONOSCOPY WITH PROPOFOL;  Surgeon: Earline Mayotte, MD;  Location: [facility] ENDOSCOPY;  Service:               Endoscopy;  Laterality: N/A; No date: DILATION AND CURETTAGE OF UTERUS No date: EYE SURGERY No date: VARICOSE VEIN SURGERY  BMI  Body Mass Index: 24.58 kg/m      Reproductive/Obstetrics negative OB ROS                             Anesthesia Physical Anesthesia Plan  ASA: 3  Anesthesia Plan: Spinal   Post-op Pain Management:    Induction: Intravenous  PONV Risk Score and Plan: Propofol infusion  Airway Management Planned: Natural Airway  Additional Equipment:   Intra-op Plan:   Post-operative Plan:   Informed Consent: I have reviewed the patients History and Physical, chart, labs and discussed the procedure including the risks, benefits and alternatives for the proposed anesthesia with the patient or authorized representative who has indicated his/her understanding and acceptance.     Dental Advisory Given  Plan Discussed with: CRNA and Surgeon  Anesthesia Plan Comments:         Anesthesia Quick Evaluation

## 2023-02-09 NOTE — ED Notes (Signed)
Transport arranged to floor  

## 2023-02-09 NOTE — ED Provider Triage Note (Signed)
Emergency Medicine Provider Triage Evaluation Note  Karen Henson , a 82 y.o. female  was evaluated in triage.  Pt complains of falling while playing with grandkids. Fell on concrete.  Pain right shoulder, elbow, right hip.   Review of Systems  Positive: Laceration right elbow, alert  Negative: No head injury or neck pain.    Physical Exam  Ht 5\' 1"  (1.549 m)   Wt 59 kg   BMI 24.58 kg/m  Gen:   Awake, no distress  Alert, talkative Resp:  Normal effort  MSK:   Tender right shoulder, elbow.  Laceration right elbow and edema.  Tender hip to compression right. Other:    Medical Decision Making  Medically screening exam initiated at 1:48 PM.  Appropriate orders placed.  Karen Henson was informed that the remainder of the evaluation will be completed by another provider, this initial triage assessment does not replace that evaluation, and the importance of remaining in the ED until their evaluation is complete.     Karen Rumps, PA-C 02/09/23 1351

## 2023-02-10 ENCOUNTER — Inpatient Hospital Stay (HOSPITAL_COMMUNITY)
Admit: 2023-02-10 | Discharge: 2023-02-10 | Disposition: A | Discharge status: Home / Self Care | Payer: Medicare Other | Attending: Internal Medicine | Admitting: Internal Medicine

## 2023-02-10 ENCOUNTER — Inpatient Hospital Stay: Payer: Medicare Other

## 2023-02-10 ENCOUNTER — Encounter
Admission: EM | Disposition: A | Discharge status: Skilled Nursing Facility | Payer: Self-pay | Source: Home / Self Care | Attending: Internal Medicine

## 2023-02-10 ENCOUNTER — Other Ambulatory Visit: Payer: Self-pay

## 2023-02-10 ENCOUNTER — Inpatient Hospital Stay: Payer: Medicare Other | Admitting: Anesthesiology

## 2023-02-10 DIAGNOSIS — S72001A Fracture of unspecified part of neck of right femur, initial encounter for closed fracture: Secondary | ICD-10-CM | POA: Diagnosis not present

## 2023-02-10 DIAGNOSIS — I959 Hypotension, unspecified: Secondary | ICD-10-CM | POA: Diagnosis not present

## 2023-02-10 DIAGNOSIS — Z01810 Encounter for preprocedural cardiovascular examination: Secondary | ICD-10-CM

## 2023-02-10 DIAGNOSIS — J449 Chronic obstructive pulmonary disease, unspecified: Secondary | ICD-10-CM | POA: Diagnosis not present

## 2023-02-10 DIAGNOSIS — W19XXXA Unspecified fall, initial encounter: Secondary | ICD-10-CM

## 2023-02-10 DIAGNOSIS — I4891 Unspecified atrial fibrillation: Secondary | ICD-10-CM

## 2023-02-10 DIAGNOSIS — S72001S Fracture of unspecified part of neck of right femur, sequela: Secondary | ICD-10-CM | POA: Diagnosis not present

## 2023-02-10 HISTORY — PX: HIP ARTHROPLASTY: SHX981

## 2023-02-10 LAB — ECHOCARDIOGRAM COMPLETE
AR max vel: 2.7 cm2
AV Area VTI: 3.02 cm2
AV Area mean vel: 2.42 cm2
AV Mean grad: 1 mmHg
AV Peak grad: 2.3 mmHg
Ao pk vel: 0.76 m/s
Area-P 1/2: 4.12 cm2
Height: 61 in
S' Lateral: 2.8 cm
Weight: 2081.14 oz

## 2023-02-10 LAB — GLUCOSE, CAPILLARY: Glucose-Capillary: 125 mg/dL — ABNORMAL HIGH (ref 70–99)

## 2023-02-10 LAB — CBC
HCT: 41.9 % (ref 36.0–46.0)
Hemoglobin: 13.6 g/dL (ref 12.0–15.0)
MCH: 31.3 pg (ref 26.0–34.0)
MCHC: 32.5 g/dL (ref 30.0–36.0)
MCV: 96.5 fL (ref 80.0–100.0)
Platelets: 281 10*3/uL (ref 150–400)
RBC: 4.34 MIL/uL (ref 3.87–5.11)
RDW: 14.5 % (ref 11.5–15.5)
WBC: 11.3 10*3/uL — ABNORMAL HIGH (ref 4.0–10.5)
nRBC: 0 % (ref 0.0–0.2)

## 2023-02-10 LAB — BASIC METABOLIC PANEL
Anion gap: 9 (ref 5–15)
BUN: 23 mg/dL (ref 8–23)
CO2: 24 mmol/L (ref 22–32)
Calcium: 8.7 mg/dL — ABNORMAL LOW (ref 8.9–10.3)
Chloride: 99 mmol/L (ref 98–111)
Creatinine, Ser: 1.21 mg/dL — ABNORMAL HIGH (ref 0.44–1.00)
GFR, Estimated: 45 mL/min — ABNORMAL LOW (ref >60)
Glucose, Bld: 129 mg/dL — ABNORMAL HIGH (ref 70–99)
Potassium: 4.6 mmol/L (ref 3.5–5.1)
Sodium: 132 mmol/L — ABNORMAL LOW (ref 135–145)

## 2023-02-10 LAB — MAGNESIUM: Magnesium: 2.1 mg/dL (ref 1.7–2.4)

## 2023-02-10 LAB — PHOSPHORUS: Phosphorus: 6.4 mg/dL — ABNORMAL HIGH (ref 2.5–4.6)

## 2023-02-10 SURGERY — HEMIARTHROPLASTY, HIP, DIRECT ANTERIOR APPROACH, FOR FRACTURE
Anesthesia: Spinal | Site: Hip | Laterality: Right

## 2023-02-10 MED ORDER — FENTANYL CITRATE (PF) 100 MCG/2ML IJ SOLN
INTRAMUSCULAR | Status: DC | PRN
  Administered 2023-02-10: 25 ug via INTRAVENOUS

## 2023-02-10 MED ORDER — TRANEXAMIC ACID-NACL 1000-0.7 MG/100ML-% IV SOLN
1000.0000 mg | Freq: Once | INTRAVENOUS | Status: AC
  Administered 2023-02-10: 1000 mg via INTRAVENOUS

## 2023-02-10 MED ORDER — ONDANSETRON HCL 4 MG PO TABS: 4.0000 mg | ORAL_TABLET | Freq: Four times a day (QID) | ORAL | Status: DC | PRN

## 2023-02-10 MED ORDER — CEFAZOLIN SODIUM-DEXTROSE 2-4 GM/100ML-% IV SOLN
2.0000 g | INTRAVENOUS | Status: AC
  Administered 2023-02-10: 2 g via INTRAVENOUS

## 2023-02-10 MED ORDER — PHENYLEPHRINE HCL (PRESSORS) 10 MG/ML IV SOLN
INTRAVENOUS | Status: DC | PRN
  Administered 2023-02-10 (×2): 160 ug via INTRAVENOUS
  Administered 2023-02-10: 80 ug via INTRAVENOUS

## 2023-02-10 MED ORDER — BUPIVACAINE LIPOSOME 1.3 % IJ SUSP
INTRAMUSCULAR | Status: DC | PRN
  Administered 2023-02-10: 50 mL

## 2023-02-10 MED ORDER — FENTANYL CITRATE (PF) 100 MCG/2ML IJ SOLN
INTRAMUSCULAR | Status: AC
  Filled 2023-02-10: qty 2

## 2023-02-10 MED ORDER — METOCLOPRAMIDE HCL 5 MG PO TABS: 5.0000 mg | ORAL_TABLET | Freq: Three times a day (TID) | ORAL | Status: DC | PRN

## 2023-02-10 MED ORDER — ENOXAPARIN SODIUM 40 MG/0.4ML IJ SOSY
40.0000 mg | PREFILLED_SYRINGE | INTRAMUSCULAR | Status: DC
  Administered 2023-02-11: 40 mg via SUBCUTANEOUS
  Filled 2023-02-10: qty 0.4

## 2023-02-10 MED ORDER — ZOLPIDEM TARTRATE 5 MG PO TABS: 5.0000 mg | ORAL_TABLET | Freq: Every evening | ORAL | Status: DC | PRN

## 2023-02-10 MED ORDER — DOCUSATE SODIUM 100 MG PO CAPS
100.0000 mg | ORAL_CAPSULE | Freq: Two times a day (BID) | ORAL | Status: DC
  Administered 2023-02-10 – 2023-02-13 (×6): 100 mg via ORAL
  Filled 2023-02-10 (×6): qty 1

## 2023-02-10 MED ORDER — OXYCODONE HCL 5 MG PO TABS
5.0000 mg | ORAL_TABLET | ORAL | Status: DC | PRN
  Administered 2023-02-11: 5 mg via ORAL
  Administered 2023-02-12 – 2023-02-13 (×5): 10 mg via ORAL
  Filled 2023-02-10 (×6): qty 2

## 2023-02-10 MED ORDER — OXYCODONE HCL 5 MG/5ML PO SOLN: 5.0000 mg | Freq: Once | ORAL | Status: DC | PRN

## 2023-02-10 MED ORDER — DEXAMETHASONE SODIUM PHOSPHATE 10 MG/ML IJ SOLN
INTRAMUSCULAR | Status: DC | PRN
  Administered 2023-02-10: 8 mg via INTRAVENOUS

## 2023-02-10 MED ORDER — PROPOFOL 10 MG/ML IV BOLUS
INTRAVENOUS | Status: DC | PRN
  Administered 2023-02-10: 10 mg via INTRAVENOUS

## 2023-02-10 MED ORDER — SODIUM CHLORIDE 0.9 % IR SOLN
Status: DC | PRN
  Administered 2023-02-10: 800 mL

## 2023-02-10 MED ORDER — SENNA 8.6 MG PO TABS
1.0000 | ORAL_TABLET | Freq: Two times a day (BID) | ORAL | Status: DC
  Administered 2023-02-10 – 2023-02-13 (×6): 8.6 mg via ORAL
  Filled 2023-02-10 (×6): qty 1

## 2023-02-10 MED ORDER — KETAMINE HCL 50 MG/5ML IJ SOSY
PREFILLED_SYRINGE | INTRAMUSCULAR | Status: AC
  Filled 2023-02-10: qty 5

## 2023-02-10 MED ORDER — SODIUM CHLORIDE 0.9 % IV SOLN: INTRAVENOUS | Status: DC

## 2023-02-10 MED ORDER — CEFAZOLIN SODIUM-DEXTROSE 1-4 GM/50ML-% IV SOLN
1.0000 g | Freq: Four times a day (QID) | INTRAVENOUS | Status: AC
  Administered 2023-02-10 – 2023-02-11 (×2): 1 g via INTRAVENOUS
  Filled 2023-02-10 (×2): qty 50

## 2023-02-10 MED ORDER — VASOPRESSIN 20 UNIT/ML IV SOLN
INTRAVENOUS | Status: DC | PRN
  Administered 2023-02-10: .5 [IU] via INTRAVENOUS
  Administered 2023-02-10 (×3): 1 [IU] via INTRAVENOUS

## 2023-02-10 MED ORDER — SENNOSIDES-DOCUSATE SODIUM 8.6-50 MG PO TABS: 1.0000 | ORAL_TABLET | Freq: Every evening | ORAL | Status: DC | PRN

## 2023-02-10 MED ORDER — HYDROMORPHONE HCL 1 MG/ML IJ SOLN
0.2000 mg | INTRAMUSCULAR | Status: DC | PRN
  Administered 2023-02-11: 0.4 mg via INTRAVENOUS
  Filled 2023-02-10: qty 1

## 2023-02-10 MED ORDER — PHENOL 1.4 % MT LIQD: 1.0000 | OROMUCOSAL | Status: DC | PRN

## 2023-02-10 MED ORDER — FLEET ENEMA 7-19 GM/118ML RE ENEM: 1.0000 | ENEMA | Freq: Once | RECTAL | Status: DC | PRN

## 2023-02-10 MED ORDER — BUPIVACAINE HCL (PF) 0.5 % IJ SOLN
INTRAMUSCULAR | Status: DC | PRN
  Administered 2023-02-10: 2.5 mL via INTRATHECAL

## 2023-02-10 MED ORDER — ACETAMINOPHEN 500 MG PO TABS
1000.0000 mg | ORAL_TABLET | Freq: Three times a day (TID) | ORAL | Status: DC
  Administered 2023-02-10 – 2023-02-13 (×9): 1000 mg via ORAL
  Filled 2023-02-10 (×9): qty 2

## 2023-02-10 MED ORDER — ONDANSETRON HCL 4 MG/2ML IJ SOLN: 4.0000 mg | Freq: Four times a day (QID) | INTRAMUSCULAR | Status: DC | PRN

## 2023-02-10 MED ORDER — SODIUM CHLORIDE 0.9 % IV BOLUS
500.0000 mL | Freq: Once | INTRAVENOUS | Status: AC
  Administered 2023-02-10: 500 mL via INTRAVENOUS

## 2023-02-10 MED ORDER — METOPROLOL SUCCINATE ER 25 MG PO TB24
12.5000 mg | ORAL_TABLET | Freq: Every day | ORAL | Status: DC
  Administered 2023-02-11: 12.5 mg via ORAL
  Filled 2023-02-10: qty 1

## 2023-02-10 MED ORDER — NOREPINEPHRINE BITARTRATE 1 MG/ML IV SOLN
INTRAVENOUS | Status: AC
  Filled 2023-02-10: qty 4

## 2023-02-10 MED ORDER — KETAMINE HCL 10 MG/ML IJ SOLN
INTRAMUSCULAR | Status: DC | PRN
  Administered 2023-02-10 (×3): 5 mg via INTRAVENOUS

## 2023-02-10 MED ORDER — TRANEXAMIC ACID-NACL 1000-0.7 MG/100ML-% IV SOLN
1000.0000 mg | INTRAVENOUS | Status: AC
  Administered 2023-02-10: 1000 mg via INTRAVENOUS

## 2023-02-10 MED ORDER — ACETAMINOPHEN 500 MG PO TABS
ORAL_TABLET | ORAL | Status: AC
  Filled 2023-02-10: qty 2

## 2023-02-10 MED ORDER — PHENYLEPHRINE HCL-NACL 20-0.9 MG/250ML-% IV SOLN
INTRAVENOUS | Status: DC | PRN
  Administered 2023-02-10: 50 ug/min via INTRAVENOUS

## 2023-02-10 MED ORDER — ACETAMINOPHEN 500 MG PO TABS
1000.0000 mg | ORAL_TABLET | Freq: Once | ORAL | Status: AC
  Administered 2023-02-10: 1000 mg via ORAL

## 2023-02-10 MED ORDER — SODIUM CHLORIDE 0.9 % IR SOLN
Status: DC | PRN
  Administered 2023-02-10: 504 mL via SURGICAL_CAVITY

## 2023-02-10 MED ORDER — CEFAZOLIN SODIUM-DEXTROSE 2-4 GM/100ML-% IV SOLN
INTRAVENOUS | Status: AC
  Filled 2023-02-10: qty 100

## 2023-02-10 MED ORDER — DROPERIDOL 2.5 MG/ML IJ SOLN: 0.6250 mg | Freq: Once | INTRAMUSCULAR | Status: DC | PRN

## 2023-02-10 MED ORDER — OXYCODONE HCL 5 MG PO TABS
2.5000 mg | ORAL_TABLET | ORAL | Status: DC | PRN
  Administered 2023-02-11: 5 mg via ORAL
  Filled 2023-02-10 (×2): qty 1

## 2023-02-10 MED ORDER — TRANEXAMIC ACID-NACL 1000-0.7 MG/100ML-% IV SOLN
INTRAVENOUS | Status: AC
  Filled 2023-02-10: qty 100

## 2023-02-10 MED ORDER — OXYCODONE HCL 5 MG PO TABS: 5.0000 mg | ORAL_TABLET | Freq: Once | ORAL | Status: DC | PRN

## 2023-02-10 MED ORDER — BISACODYL 10 MG RE SUPP
10.0000 mg | Freq: Every day | RECTAL | Status: DC | PRN
  Administered 2023-02-13: 10 mg via RECTAL
  Filled 2023-02-10: qty 1

## 2023-02-10 MED ORDER — MENTHOL 3 MG MT LOZG: 1.0000 | LOZENGE | OROMUCOSAL | Status: DC | PRN

## 2023-02-10 MED ORDER — METOCLOPRAMIDE HCL 5 MG/ML IJ SOLN: 5.0000 mg | Freq: Three times a day (TID) | INTRAMUSCULAR | Status: DC | PRN

## 2023-02-10 MED ORDER — ONDANSETRON HCL 4 MG/2ML IJ SOLN
INTRAMUSCULAR | Status: DC | PRN
  Administered 2023-02-10: 4 mg via INTRAVENOUS

## 2023-02-10 MED ORDER — TRAMADOL HCL 50 MG PO TABS: 50.0000 mg | ORAL_TABLET | Freq: Four times a day (QID) | ORAL | Status: DC | PRN

## 2023-02-10 MED ORDER — PROMETHAZINE HCL 25 MG/ML IJ SOLN: 6.2500 mg | INTRAMUSCULAR | Status: DC | PRN

## 2023-02-10 MED ORDER — KETOROLAC TROMETHAMINE 15 MG/ML IJ SOLN
7.5000 mg | Freq: Four times a day (QID) | INTRAMUSCULAR | Status: AC
  Administered 2023-02-10 – 2023-02-11 (×4): 7.5 mg via INTRAVENOUS
  Filled 2023-02-10 (×4): qty 1

## 2023-02-10 SURGICAL SUPPLY — 80 items
ADH SKN CLS APL DERMABOND .7 (GAUZE/BANDAGES/DRESSINGS) ×1
APL PRP STRL LF DISP 70% ISPRP (MISCELLANEOUS) ×2
BLADE SAW SGTL 13X75X1.27 (BLADE) ×1 IMPLANT
BNDG CMPR 5X4 CHSV STRCH STRL (GAUZE/BANDAGES/DRESSINGS) ×1
BNDG COHESIVE 4X5 TAN STRL LF (GAUZE/BANDAGES/DRESSINGS) ×1 IMPLANT
BOWL CEMENT MIXING ADV NOZZLE (MISCELLANEOUS) ×1 IMPLANT
CEMENT BONE 1-PACK (Cement) IMPLANT
CHLORAPREP W/TINT 26 (MISCELLANEOUS) ×2 IMPLANT
COVER BACK TABLE REUSABLE LG (DRAPES) ×1 IMPLANT
COVER MAYO STAND STRL (DRAPES) ×1 IMPLANT
DERMABOND ADVANCED .7 DNX12 (GAUZE/BANDAGES/DRESSINGS) ×1 IMPLANT
DRAPE 3/4 80X56 (DRAPES) ×1 IMPLANT
DRAPE INCISE IOBAN 66X60 STRL (DRAPES) ×1 IMPLANT
DRAPE ORTHO SPLIT 77X108 STRL (DRAPES) ×1
DRAPE SURG ORHT 6 SPLT 77X108 (DRAPES) ×1 IMPLANT
DRAPE U-SHAPE 47X51 STRL (DRAPES) ×1 IMPLANT
DRSG MEPILEX SACRM 8.7X9.8 (GAUZE/BANDAGES/DRESSINGS) IMPLANT
DRSG OPSITE POSTOP 4X10 (GAUZE/BANDAGES/DRESSINGS) IMPLANT
DRSG OPSITE POSTOP 4X8 (GAUZE/BANDAGES/DRESSINGS) IMPLANT
ELECT CAUTERY BLADE 6.4 (BLADE) IMPLANT
ELECT REM PT RETURN 9FT ADLT (ELECTROSURGICAL) ×1
ELECTRODE REM PT RTRN 9FT ADLT (ELECTROSURGICAL) ×1 IMPLANT
GAUZE SPONGE 4X4 12PLY STRL (GAUZE/BANDAGES/DRESSINGS) IMPLANT
GAUZE XEROFORM 1X8 LF (GAUZE/BANDAGES/DRESSINGS) ×1 IMPLANT
GLOVE BIOGEL PI IND STRL 8 (GLOVE) ×2 IMPLANT
GLOVE SURG ORTHO 8.0 STRL STRW (GLOVE) ×2 IMPLANT
GOWN STRL REUS W/ TWL LRG LVL3 (GOWN DISPOSABLE) ×1 IMPLANT
GOWN STRL REUS W/ TWL XL LVL3 (GOWN DISPOSABLE) ×1 IMPLANT
GOWN STRL REUS W/TWL LRG LVL3 (GOWN DISPOSABLE) ×1
GOWN STRL REUS W/TWL XL LVL3 (GOWN DISPOSABLE) ×1
HEAD MODULAR ENDO (Orthopedic Implant) ×1 IMPLANT
HEAD UNPLR 49XMDLR STRL HIP (Orthopedic Implant) IMPLANT
HEMOVAC 400ML (MISCELLANEOUS)
HOOD PEEL AWAY T7 (MISCELLANEOUS) ×2 IMPLANT
IMMOB KNEE 24 THIGH 24 443303 (SOFTGOODS) IMPLANT
IV NS IRRIG 3000ML ARTHROMATIC (IV SOLUTION) ×1 IMPLANT
KIT DRAIN HEMOVAC JP 7FR 400ML (MISCELLANEOUS) IMPLANT
KIT PREP HIP W/CEMENT RESTRICT (Miscellaneous) ×1 IMPLANT
KIT PREPARATION TOTAL HIP (Miscellaneous) ×1 IMPLANT
KIT TURNOVER KIT A (KITS) ×1 IMPLANT
MANIFOLD NEPTUNE II (INSTRUMENTS) ×2 IMPLANT
NDL FILTER BLUNT 18X1 1/2 (NEEDLE) ×1 IMPLANT
NDL HYPO 21X1.5 SAFETY (NEEDLE) IMPLANT
NDL HYPO 22X1.5 SAFETY MO (MISCELLANEOUS) ×1 IMPLANT
NDL MAYO CATGUT SZ 2 (NEEDLE) IMPLANT
NDL MAYO CATGUT SZ1 (NEEDLE) ×1 IMPLANT
NDL SAFETY ECLIP 18X1.5 (MISCELLANEOUS) ×1 IMPLANT
NEEDLE FILTER BLUNT 18X1 1/2 (NEEDLE) ×1 IMPLANT
NEEDLE HYPO 21X1.5 SAFETY (NEEDLE) ×1 IMPLANT
NEEDLE HYPO 22X1.5 SAFETY MO (MISCELLANEOUS) ×1 IMPLANT
NEEDLE MAYO CATGUT SZ 1.5 (NEEDLE) ×1
NEEDLE MAYO CATGUT SZ 2 (NEEDLE) ×1 IMPLANT
NEEDLE MAYO CATGUT SZ1 (NEEDLE) ×1 IMPLANT
NS IRRIG 1000ML POUR BTL (IV SOLUTION) ×1 IMPLANT
PACK HIP PROSTHESIS (MISCELLANEOUS) ×1 IMPLANT
PENCIL SMOKE EVACUATOR (MISCELLANEOUS) ×1 IMPLANT
PILLOW ABDUCTION FOAM SM (MISCELLANEOUS) ×1 IMPLANT
PULSAVAC PLUS IRRIG FAN TIP (DISPOSABLE) ×1
SLEEVE UNITRAX V40 (Orthopedic Implant) ×1 IMPLANT
SLEEVE UNITRAX V40 +4 (Orthopedic Implant) IMPLANT
SPACER OSTEO CEMENT (Orthopedic Implant) ×1 IMPLANT
SPACER OSTEO CEMENT 14 HIP (Orthopedic Implant) IMPLANT
SPONGE T-LAP 18X18 ~~LOC~~+RFID (SPONGE) ×2 IMPLANT
STAPLER SKIN PROX 35W (STAPLE) ×1 IMPLANT
STEM HIP ACCOLADE SZ5 37X145 (Stem) IMPLANT
SUT ETHIBOND #5 BRAIDED 30INL (SUTURE) ×1 IMPLANT
SUT MNCRL 4-0 (SUTURE) ×1
SUT MNCRL 4-0 27XMFL (SUTURE) ×1
SUT VIC AB 0 CT1 36 (SUTURE) ×1 IMPLANT
SUT VIC AB 2-0 CT2 27 (SUTURE) ×2 IMPLANT
SUTURE MNCRL 4-0 27XMF (SUTURE) ×1 IMPLANT
SYR 20ML LL LF (SYRINGE) ×2 IMPLANT
SYR 50ML LL SCALE MARK (SYRINGE) ×1 IMPLANT
TAPE MICROFOAM 4IN (TAPE) ×1 IMPLANT
TIP BRUSH PULSAVAC PLUS 24.33 (MISCELLANEOUS) ×1 IMPLANT
TIP FAN IRRIG PULSAVAC PLUS (DISPOSABLE) ×1 IMPLANT
TRAP FLUID SMOKE EVACUATOR (MISCELLANEOUS) ×1 IMPLANT
TUBE KAMVAC SUCTION (TUBING) ×1 IMPLANT
TUBE SUCT KAM VAC (TUBING) ×1 IMPLANT
WATER STERILE IRR 500ML POUR (IV SOLUTION) ×1 IMPLANT

## 2023-02-10 NOTE — Op Note (Addendum)
DATE OF SURGERY: 02/10/2023  PREOPERATIVE DIAGNOSIS:  Right femoral neck fracture Right anterior/inferior glenoid rim fracture  POSTOPERATIVE DIAGNOSIS: 1. Right femoral neck fracture 2. Right anterior/inferior glenoid rim fracture  PROCEDURE:  Right hip hemiarthroplasty Right shoulder exam under anesthesia, closed management of glenoid fracture  SURGEON: Rosealee Albee, MD  ASSISTANTS: Rayburn Go, PA  EBL: 200 cc  COMPONENTS:  [employer] - Accolade C Size 5 Stem [employer]ker - Unitrax 49mm head with +4 offset neck   INDICATIONS: Karen Henson is a 82 y.o. female who sustained a displaced femoral neck fracture after a fall. Risks and benefits of hip hemiarthroplasty were explained to the patient and/or family. Patient also sustained an anterior/inferior glenoid rim fracture that appears to involve ~10% of the glenoid width. Risks include but are not limited to bleeding, infection, injury to tissues, nerves, vessels, periprosthetic infection, dislocation, limb length discrepancy and risks of anesthesia. The patient and/or family understands these risks, has completed an informed consent and wishes to proceed.   PROCEDURE:  The patient was identified in the preoperative holding area and the operative extremity was marked.  The patient was then transferred to the operating room suite and mobilized from the hospital gurney to the operating room table. Spinal anesthesia was administered without complication.  Exam under anesthesia of the right shoulder was performed.  The shoulder was taken through a gentle range of motion.  She is able to achieve 150 degrees forward flexion, 90 degrees abduction, 90 degrees external rotation with the arm abducted, 45 degrees internal rotation with the arm abducted, and 45 degrees external rotation with the arm adducted.  There is no glenohumeral instability.  Decision was made to treat glenoid fracture nonoperatively given the stability of the shoulder.     Next, the patient was then transitioned to a lateral position.  All bony prominences were padded per protocol.  An axillary roll was placed.  Careful attention was paid to the contralateral side peroneal nerve, which was free from pressure with use of appropriate padding and blankets. A time-out was performed to confirm the patient's identity and the correct laterality of surgery. The patient was then prepped and draped in the usual sterile fashion. Appropriate pre-operative antibiotics were administered. Tranexamic acid was administered preoperatively.    An incision that centered on the posterior tip of the greater trochanter with a posterior curve was made. Dissection was carried down through the subcutaneous tissue.  Careful attention was made to maintain hemostasis using electrocautery.  Dissection brought [place] to the level of the deep fascia where the gluteus maximus muscle and proximal portion of the IT band were identified.  The proximal region of the IT band was incised in linear fashion and this incision was extended proximally in a curvilinear fashion to split the gluteus maximus muscle parallel to its fibers to minimize bleeding.  This was accomplished using a combination bovie electrocautery as well as blunt dissection.  The trochanteric bursa was then visualized and dissected from anterior to posterior. A blunt homan retractor was placed beneath the abductors. The piriformis tendon and short external rotators were visualized. Bovie electrocautery was used to cut these with the capsule as one L-shaped flap. This was tagged at the corner with #5 Ethibond. At this point, the femoral neck fracture was visualized. An oscillating saw was used to make a new neck cut approximately 15mm above the lesser tuberosity with the use of a neck cut guide. The head was then freed from its remaining soft tissue attachments  and measured. The head trial was then inserted into the acetabulum and sized appropriately.     We then turned our attention to preparing the femoral canal. First, a box cut was performed utilizing the box osteotome. A canal finder was inserted by hand and sequential broaching was then performed. The calcar planer was inserted onto the broach and used to smooth the calcar appropriately.  A trial stem, neutral neck, and head were inserted into the acetabulum and placed through range of motion. Intraoperative radiographs were taken to assess hardware position and leg lengths. The hip was again dislocated and the femoral trial components were removed. An appropriately sized cement restrictor was placed. The canal was irrigated with pulse lavage and dried. A cement gun was used to place cement into the femoral canal. Cement was then pressurized. The actual stem was inserted into the femoral canal and then driven onto the calcar. Position was held until the cement hardened. The trial head was then again inserted on the femoral component and found to be appropriate. Trial was removed and the permanent head/neck was Morse tapered onto the femoral stem and then reduced into the acetabulum.    The hip stability and length were reassessed and found to be satisfactory.  The wound was then copiously irrigated with normal saline solution. The tagged sutures of the capsule and piriformis were sewn to the gluteus medius tendon. This adequately closed the hip capsule. The IT band and gluteus maximus fascia were then closed with 0-Vicryl in a running, locked fashion. A mixture of Exparel and bupivicaine was administered.  The subdermal layer was closed with 2-0 Vicryl in a buried interrupted fashion. Skin was approximated with staples.  Sterile dressing was applied. An abduction pillow was placed. The patient was mobilized from the lateral position back to supine on the operating room table and then awakened from general anesthesia without complication.   POSTOPERATIVE PLAN: The patient will be WBAT on the RLE. Sling  to RUE for comfort. Recommend NWB on RUE, but patient may use RUE to perform ADLs. Lovenox 40mg /day x 4 weeks to start on POD#1. IV Abx x 24 hours. PT/OT on POD#1. Posterior hip precautions.

## 2023-02-10 NOTE — Hospital Course (Signed)
82 year old female with history of COPD, hypertension, idiopathic pulmonary fibrosis, osteoporosis presents to the ED after a fall while playing T-ball with her grandson.  Landed on the cement with her right hip and right shoulder.  Found to have a right femoral neck fracture.  In ER also found to be in rapid atrial fibrillation.  5/12.  Blood pressure low this morning.  Antihypertensive medications held.  Patient given a fluid bolus.  Patient still in atrial fibrillation.

## 2023-02-10 NOTE — H&P (Signed)
H&P reviewed. No significant changes noted.  

## 2023-02-10 NOTE — Progress Notes (Signed)
Patient had bp of 85/69. Provider notified and NS bolus ordered and given.

## 2023-02-10 NOTE — Assessment & Plan Note (Signed)
Prior to surgery.  This has improved.

## 2023-02-10 NOTE — Consult Note (Signed)
Cardiology Consult    Patient ID: LITHA MCKEOUGH MRN: [id number], DOB/AGE: August 23, 1941   Admit date: 02/09/2023 Date of Consult: 02/10/2023  Primary Physician: Erasmo Downer, MD Primary Cardiologist: Julien Nordmann, MD - new Requesting Provider: R. Wieting  Patient Profile    Karen Henson is a 82 y.o. female with a history of HTN, COPD, idiopathic pulm fibrosis, and osteoporosis, who is being seen today for the evaluation of afib and preop eval in the setting of fall w/ R femoral neck fx and fx of R anterior inferior glenoid at the request of Dr. Renae Gloss.  Past Medical History   Past Medical History:  Diagnosis Date   Allergy    Environmental Allergies   Arthritis    Bronchitis, chronic (HCC)    COPD (chronic obstructive pulmonary disease) (HCC)    Cough    CHRONIC   Dyspnea    OCCAS   Hypertension    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HCC)    Osteoporosis     Past Surgical History:  Procedure Laterality Date   BREAST EXCISIONAL BIOPSY Left 1996   neg   BREAST SURGERY     CAPSULOTOMY     CATARACT EXTRACTION W/PHACO Left 10/31/2017   Procedure: CATARACT EXTRACTION PHACO AND INTRAOCULAR LENS PLACEMENT (IOC);  Surgeon: Nevada Crane, MD;  Location: [facility] ORS;  Service: Ophthalmology;  Laterality: Left;  [place] 00:43.7 AP% 19.6 CDE 8.57 FLUID PACK LOT # [id number]   CATARACT EXTRACTION W/PHACO Right 12/05/2017   Procedure: CATARACT EXTRACTION PHACO AND INTRAOCULAR LENS PLACEMENT (IOC);  Surgeon: Nevada Crane, MD;  Location: [facility] ORS;  Service: Ophthalmology;  Laterality: Right;  fluid pack lot #[id number]  exp 07/01/2019 [place]    00:31.4 AP%    8.8 CDE    2.79   COLONOSCOPY  02/15/2006   COLONOSCOPY WITH PROPOFOL N/A 03/21/2016   Procedure: COLONOSCOPY WITH PROPOFOL;  Surgeon: Earline Mayotte, MD;  Location: [facility] ENDOSCOPY;  Service: Endoscopy;  Laterality: N/A;   COLONOSCOPY WITH PROPOFOL N/A 03/29/2021   Procedure: COLONOSCOPY WITH PROPOFOL;  Surgeon: Earline Mayotte, MD;   Location: [facility] ENDOSCOPY;  Service: Endoscopy;  Laterality: N/A;   DILATION AND CURETTAGE OF UTERUS     EYE SURGERY     VARICOSE VEIN SURGERY       Allergies  Allergies  Allergen Reactions   Lisinopril Cough    History of Present Illness     82 y.o. female with a history of HTN, COPD, idiopathic pulm fibrosis, and osteoporosis.  She has no prior cardiac history.  On 5/11, she was playing T-ball w/ her grandson and she tripped over her own feet w/ her right side landing on cement. She developed R shoulder and hip pain prompting ED eval.  Here, she hypertensive on arrival.  Labs unremarkable.  ECG w/ Afib, RVR @ 115, leftward axis, diffuse predominantly upsloping ST depression, and septal infarct.  Imaging notable for acute comminuted fx of the R anterior inferior glenoid and acute angulated and displaced fx of the R femoral neck.  She was admitted by medicine team and placed on oral ? blocker.  Echo pending.  Currently not on anticoagulation.  Seen by ortho with plan surgery today.  Pt sleepy this AM - multiple doses of dilaudid.  ? blocker ordered but not given since last night. Rates 80's to 90's this AM, remains in Afib.  Echo this AM w/ EF of 50 to 55%, nl RV fxn, significant TR, elevated right heart pressures.   Inpatient  Medications     acetaminophen  1,000 mg Oral Once   metoprolol succinate  12.5 mg Oral QHS   montelukast  10 mg Oral QHS    Family History    Family History  Problem Relation Age of Onset   Hypertension Mother    Emphysema Father    Stroke Sister    Stroke Brother    Diabetes Maternal Aunt    Diabetes Maternal Grandmother    Lung cancer Grandchild    Breast cancer Neg Hx    Ovarian cancer Neg Hx    Colon cancer Neg Hx    She indicated that her mother is deceased. She indicated that her father is deceased. She indicated that her sister is deceased. She indicated that her brother is deceased. She indicated that the status of her maternal grandmother is  unknown. She indicated that the status of her maternal aunt is unknown. She indicated that her grandchild is alive. She indicated that the status of her neg hx is unknown.   Social History    Social History   Socioeconomic History   Marital status: Widowed    Spouse name: Not on file   Number of children: 2   Years of education: Not on file   Highest education level: High school graduate  Occupational History   Occupation: retired  Tobacco Use   Smoking status: Former    Packs/day: .25    Types: Cigarettes   Smokeless tobacco: Never   Tobacco comments:    smoked a pack a week, quit about 30 years ago  Vaping Use   Vaping Use: Never used  Substance and Sexual Activity   Alcohol use: Yes    Comment: ocassional- monthly   Drug use: No   Sexual activity: Never  Other Topics Concern   Not on file  Social History Narrative   Not on file   Social Determinants of Health   Financial Resource Strain: Low Risk  (08/11/2020)   Overall Financial Resource Strain (CARDIA)    Difficulty of Paying Living Expenses: Not hard at all  Food Insecurity: No Food Insecurity (02/09/2023)   Hunger Vital Sign    Worried About Running Out of Food in the Last Year: Never true    Ran Out of Food in the Last Year: Never true  Transportation Needs: No Transportation Needs (02/09/2023)   PRAPARE - Administrator, Civil Service (Medical): No    Lack of Transportation (Non-Medical): No  Physical Activity: Sufficiently Active (08/11/2020)   Exercise Vital Sign    Days of Exercise per Week: 5 days    Minutes of Exercise per Session: 30 min  Stress: No Stress Concern Present (08/11/2020)   [employer] of Occupational Health - Occupational Stress Questionnaire    Feeling of Stress : Not at all  Social Connections: Moderately Integrated (08/11/2020)   Social Connection and Isolation Panel [NHANES]    Frequency of Communication with Friends and Family: More than three times a week     Frequency of Social Gatherings with Friends and Family: More than three times a week    Attends Religious Services: More than 4 times per year    Active Member of [employer] or Organizations: Yes    Attends Banker Meetings: More than 4 times per year    Marital Status: Widowed  Intimate Partner Violence: Not At Risk (02/09/2023)   Humiliation, Afraid, Rape, and Kick questionnaire    Fear of Current or Ex-Partner: No  Emotionally Abused: No    Physically Abused: No    Sexually Abused: No     Review of Systems    General:  No chills, fever, night sweats or weight changes.  Cardiovascular:  No chest pain, dyspnea on exertion, edema, orthopnea, palpitations, paroxysmal nocturnal dyspnea. Dermatological: No rash, lesions/masses Respiratory: No cough, dyspnea Urologic: No hematuria, dysuria Abdominal:   No nausea, vomiting, diarrhea, bright red blood per rectum, melena, or hematemesis Neurologic:  No visual changes, wkns, changes in mental status. MSK:  +++ hip/arm pain. All other systems reviewed and are otherwise negative except as noted above.  Physical Exam    Blood pressure (!) 85/69, pulse 74, temperature 97.6 F (36.4 C), temperature source Oral, resp. rate 16, height 5\' 1"  (1.549 m), weight 59 kg, SpO2 94 %.  General: Pleasant, NAD. Sleepy, intermittently dozing. Psych: Normal affect. Neuro: Alert and oriented X 3. Moves all extremities spontaneously. HEENT: Normal  Neck: Supple without bruits or JVD. Lungs:  Resp regular and unlabored, CTA. Heart: IR, IR, no s3, s4, or murmurs. Abdomen: Soft, non-tender, non-distended, BS + x 4.  Extremities: No clubbing, cyanosis or edema. DP/PT2+, Radials 2+ and equal bilaterally.  Labs    Lab Results  Component Value Date   WBC 11.3 (H) 02/10/2023   HGB 13.6 02/10/2023   HCT 41.9 02/10/2023   MCV 96.5 02/10/2023   PLT 281 02/10/2023    Recent Labs  Lab 02/10/23 0524  NA 132*  K 4.6  CL 99  CO2 24  BUN 23   CREATININE 1.21*  CALCIUM 8.7*  GLUCOSE 129*   Lab Results  Component Value Date   CHOL 166 11/07/2022   HDL 57 11/07/2022   LDLCALC 92 11/07/2022   TRIG 92 11/07/2022     Radiology Studies    CT Shoulder Right Wo Contrast  Result Date: 02/09/2023 CLINICAL DATA:  Fall, pain EXAM: CT OF THE UPPER RIGHT EXTREMITY WITHOUT CONTRAST TECHNIQUE: Multidetector CT imaging of the upper right extremity was performed according to the standard protocol. RADIATION DOSE REDUCTION: This exam was performed according to the departmental dose-optimization program which includes automated exposure control, adjustment of the mA and/or kV according to patient size and/or use of iterative reconstruction technique. COMPARISON:  Right shoulder x-ray earlier same day FINDINGS: Bones/Joint/Cartilage There is an acute comminuted fracture of the anterior inferior glenoid. Fracture fragments are distracted 6 mm. There is no dislocation. No focal osseous lesion identified. Ligaments Suboptimally assessed by CT. Muscles and Tendons No intramuscular hematoma. Soft tissues There is deep soft tissue edema surrounding the humeral head. Small joint effusion present. IMPRESSION: Acute comminuted fracture of the anterior inferior glenoid. Electronically Signed   By: Darliss Cheney M.D.   On: 02/09/2023 17:17   CT Hip Right Wo Contrast  Result Date: 02/09/2023 CLINICAL DATA:  Fall EXAM: CT OF THE RIGHT HIP WITHOUT CONTRAST TECHNIQUE: Multidetector CT imaging of the right hip was performed according to the standard protocol. Multiplanar CT image reconstructions were also generated. RADIATION DOSE REDUCTION: This exam was performed according to the departmental dose-optimization program which includes automated exposure control, adjustment of the mA and/or kV according to patient size and/or use of iterative reconstruction technique. COMPARISON:  Right hip x-ray same day FINDINGS: Bones/Joint/Cartilage The bones are osteopenic. There is  an acute fracture of the right femoral neck. There is 1 cm of superior displacement of the distal fracture fragment with apex superolateral angulation. There is no dislocation. No additional fractures are identified. Ligaments Suboptimally  assessed by CT. Muscles and Tendons No intramuscular hematoma. Soft tissues Lipohemarthrosis is noted in the right hip. There is mild deep soft tissue edema surrounding the right hip. IMPRESSION: 1. Acute angulated and displaced fracture of the right femoral neck. 2. Lipohemarthrosis in the right hip. Electronically Signed   By: Darliss Cheney M.D.   On: 02/09/2023 17:15   DG Hip Unilat W or Wo Pelvis 2-3 Views Right  Result Date: 02/09/2023 CLINICAL DATA:  Fall. EXAM: DG HIP (WITH OR WITHOUT PELVIS) 2-3V RIGHT COMPARISON:  None Available. FINDINGS: Bones are diffusely demineralized. SI joints and symphysis pubis unremarkable. Right femoral neck fracture with varus angulation. Fracture line is not well demonstrated but is probably transcervical to basicervical. There is some lucency in the humeral head raising the question of pathologic fracture. IMPRESSION: 1. Right femoral neck fracture with varus angulation. 2. Lucency in the femoral head raises the question of pathologic fracture. Electronically Signed   By: Kennith Center M.D.   On: 02/09/2023 15:27   DG Shoulder Right  Result Date: 02/09/2023 CLINICAL DATA:  Fall EXAM: RIGHT SHOULDER - 2+ VIEW COMPARISON:  None Available. FINDINGS: The bones are osteopenic. There is some linear lucencies through the inferior glenoid which may be secondary to degenerative change; however, acute nondisplaced fractures not excluded. Joint spaces are well maintained and alignment is anatomic. There are minimal degenerative changes of the acromioclavicular joint. The soft tissues are within normal limits. IMPRESSION: 1. Linear lucencies through the inferior glenoid which may be secondary to degenerative change; however, acute nondisplaced  fractures not excluded. 2. Osteopenia. Electronically Signed   By: Darliss Cheney M.D.   On: 02/09/2023 15:26   DG Elbow Complete Right  Result Date: 02/09/2023 CLINICAL DATA:  Fall.  Hematoma noted to right elbow. EXAM: RIGHT ELBOW - COMPLETE 3+ VIEW COMPARISON:  None Available. FINDINGS: There is no evidence of fracture, dislocation, or joint effusion. No focal bone abnormality. Soft tissue swelling is noted overlying the posterior aspect of the elbow/proximal forearm. IMPRESSION: No fracture or dislocation of the right elbow. Electronically Signed   By: Sherron Ales M.D.   On: 02/09/2023 15:25   DG Chest 1 View  Result Date: 02/09/2023 CLINICAL DATA:  Injury EXAM: CHEST  1 VIEW COMPARISON:  Chest x-ray 10/23/2021 FINDINGS: The heart is enlarged, unchanged. The lungs are clear. There is no pleural effusion or pneumothorax. No acute fractures are identified. IMPRESSION: Cardiomegaly without acute cardiopulmonary process. Electronically Signed   By: Darliss Cheney M.D.   On: 02/09/2023 15:24    ECG & Cardiac Imaging    Afib, 115, septal infarct, leftward axis, diffuse st/t abnormalities - personally reviewed.  Assessment & Plan    1.  Afib:  Pt presented following fall w/ R femoral neck fx and R anterior inferior glenoid fx.  Noted to be in Afib w/ RVR on arrival.  Asymptomatic.  Duration unknown.  Rates improved this AM w/ pain mgmt - currently 80's to 90's.  BP stable overnight but softer this AM in the setting of multiple doses of dilaudid (oral ? blocker held this AM).  Echo pending.  Ideally would like to cont ? blocker as pressure allows. Plan on OAC (CHA2DS2VASc = 4) post-op w/ consideration for cardioversion if appropriate following at least 3 wks of OAC.  2.  Preoperative cardiovascular evalution:  As above, fall w/ femoral neck fx and R anterior inferior glenoid fx.  No prior h/o chest pain or dyspnea.  Afib as above, w/ unknown  duration, though she is asymptomatic.  Echo w/ EF of 50-55%,  no rwma, nl RV fxn, significant TR, elevated right heart pressures.  With this, she is low risk for cardiac complications in the setting of planned surgery.  No additional ischemic eval warranted at this time.  Watch volume status closely and as above, ? blockade and pain mgmt as BP allows.  Risk Assessment/Risk Scores:          CHA2DS2-VASc Score = 4   This indicates a 4.8% annual risk of stroke. The patient's score is based upon: CHF History: 0 HTN History: 1 Diabetes History: 0 Stroke History: 0 Vascular Disease History: 0 Age Score: 2 Gender Score: 1     Signed, Nicolasa Ducking, NP 02/10/2023, 12:02 PM  For questions or updates, please contact   Please consult [email or web address] for contact info under Cardiology/STEMI.

## 2023-02-10 NOTE — Consult Note (Signed)
ORTHOPAEDIC CONSULTATION  REQUESTING PHYSICIAN: Alford Highland, MD  Chief Complaint:   R hip pain & R shoulder pain  History of Present Illness: Karen Henson is a 82 y.o. female who had a fall yesterday while she was playing T-ball with her grandson and tripped onto cement.  The patient noted immediate hip pain and inability to ambulate.  The patient ambulates unassisted at baseline.  The patient lives at home by herself, although she has family nearby. X-rays in the emergency department show a right displaced femoral neck fracture.  She is also having shoulder pain and radiographs and CT scan of the right shoulder show an anterior/inferior glenoid rim fracture.  She denies any notable dislocation episode of the shoulder.  She is right-handed.  She has a medical history significant for COPD, hypertension, and idiopathic pulmonary fibrosis.  She was also found to have new onset atrial fibrillation with heart rate in the 110s on admission.  A preoperative echocardiogram and cardiology consultation was also obtained.  Past Medical History:  Diagnosis Date   Allergy    Environmental Allergies   Arthritis    Bronchitis, chronic (HCC)    COPD (chronic obstructive pulmonary disease) (HCC)    Cough    CHRONIC   Dyspnea    OCCAS   Hypertension    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HCC)    Osteoporosis    Past Surgical History:  Procedure Laterality Date   BREAST EXCISIONAL BIOPSY Left 1996   neg   BREAST SURGERY     CAPSULOTOMY     CATARACT EXTRACTION W/PHACO Left 10/31/2017   Procedure: CATARACT EXTRACTION PHACO AND INTRAOCULAR LENS PLACEMENT (IOC);  Surgeon: [place] Crane, MD;  Location: [facility] ORS;  Service: Ophthalmology;  Laterality: Left;  [place] 00:43.7 AP% 19.6 CDE 8.57 FLUID PACK LOT # [id number]   CATARACT EXTRACTION W/PHACO Right 12/05/2017   Procedure: CATARACT EXTRACTION PHACO AND INTRAOCULAR LENS PLACEMENT (IOC);  Surgeon:  [place] Crane, MD;  Location: [facility] ORS;  Service: Ophthalmology;  Laterality: Right;  fluid pack lot #[id number]  exp 07/01/2019 [place]    00:31.4 AP%    8.8 CDE    2.79   COLONOSCOPY  02/15/2006   COLONOSCOPY WITH PROPOFOL N/A 03/21/2016   Procedure: COLONOSCOPY WITH PROPOFOL;  Surgeon: Earline Mayotte, MD;  Location: [facility] ENDOSCOPY;  Service: Endoscopy;  Laterality: N/A;   COLONOSCOPY WITH PROPOFOL N/A 03/29/2021   Procedure: COLONOSCOPY WITH PROPOFOL;  Surgeon: Earline Mayotte, MD;  Location: [facility] ENDOSCOPY;  Service: Endoscopy;  Laterality: N/A;   DILATION AND CURETTAGE OF UTERUS     EYE SURGERY     VARICOSE VEIN SURGERY     Social History   Socioeconomic History   Marital status: Widowed    Spouse name: Not on file   Number of children: 2   Years of education: Not on file   Highest education level: High school graduate  Occupational History   Occupation: retired  Tobacco Use   Smoking status: Former    Packs/day: .25    Types: Cigarettes   Smokeless tobacco: Never   Tobacco comments:    smoked a pack a week, quit about 30 years ago  Vaping Use   Vaping Use: Never used  Substance and Sexual Activity   Alcohol use: Yes    Comment: ocassional- monthly   Drug use: No   Sexual activity: Never  Other Topics Concern   Not on file  Social History Narrative   Not on file   Social Determinants  of Health   Financial Resource Strain: Low Risk  (08/11/2020)   Overall Financial Resource Strain (CARDIA)    Difficulty of Paying Living Expenses: Not hard at all  Food Insecurity: No Food Insecurity (02/09/2023)   Hunger Vital Sign    Worried About Running Out of Food in the Last Year: Never true    Ran Out of Food in the Last Year: Never true  Transportation Needs: No Transportation Needs (02/09/2023)   PRAPARE - Administrator, Civil Service (Medical): No    Lack of Transportation (Non-Medical): No  Physical Activity: Sufficiently Active (08/11/2020)   Exercise  Vital Sign    Days of Exercise per Week: 5 days    Minutes of Exercise per Session: 30 min  Stress: No Stress Concern Present (08/11/2020)   [employer] of Occupational Health - Occupational Stress Questionnaire    Feeling of Stress : Not at all  Social Connections: Moderately Integrated (08/11/2020)   Social Connection and Isolation Panel [NHANES]    Frequency of Communication with Friends and Family: More than three times a week    Frequency of Social Gatherings with Friends and Family: More than three times a week    Attends Religious Services: More than 4 times per year    Active Member of [employer] or Organizations: Yes    Attends Banker Meetings: More than 4 times per year    Marital Status: Widowed   Family History  Problem Relation Age of Onset   Hypertension Mother    Emphysema Father    Stroke Sister    Stroke Brother    Diabetes Maternal Aunt    Diabetes Maternal Grandmother    Lung cancer Grandchild    Breast cancer Neg Hx    Ovarian cancer Neg Hx    Colon cancer Neg Hx    Allergies  Allergen Reactions   Lisinopril Cough   Prior to Admission medications   Medication Sig Start Date End Date Taking? Authorizing Provider  acetaminophen (TYLENOL) 500 MG tablet Take 500 mg by mouth every 6 (six) hours as needed for moderate pain or headache.   Yes [provider]  losartan-hydrochlorothiazide (HYZAAR) 50-12.5 MG tablet TAKE 1 TABLET BY MOUTH ONCE DAILY Patient taking differently: Take 1 tablet by mouth daily. 02/04/23  Yes Bacigalupo, Marzella Schlein, MD  montelukast (SINGULAIR) 10 MG tablet TAKE 1 TABLET BY MOUTH BEDTIME FOR ASTHMA Patient taking differently: Take 10 mg by mouth at bedtime. 06/12/22  Yes Abernathy, Arlyss Repress, NP  albuterol (VENTOLIN HFA) 108 (90 Base) MCG/ACT inhaler Inhale 2 puffs into the lungs every 6 (six) hours as needed for wheezing or shortness of breath. Patient not taking: Reported on 02/09/2023    [provider]   dextromethorphan-guaiFENesin (MUCINEX DM) 30-600 MG 12hr tablet Take 1 tablet by mouth daily. Patient not taking: Reported on 02/09/2023    [provider]  loratadine (CLARITIN) 10 MG tablet Take 10 mg by mouth daily.  Patient not taking: Reported on 02/09/2023    [provider]   Recent Labs    02/09/23 1520 02/10/23 0524  WBC 9.2 11.3*  HGB 13.6 13.6  HCT 41.6 41.9  PLT 262 281  K 3.9 4.6  CL 100 99  CO2 26 24  BUN 18 23  CREATININE 0.78 1.21*  GLUCOSE 121* 129*  CALCIUM 9.0 8.7*   CT Shoulder Right Wo Contrast  Result Date: 02/09/2023 CLINICAL DATA:  Fall, pain EXAM: CT OF THE UPPER RIGHT EXTREMITY WITHOUT  CONTRAST TECHNIQUE: Multidetector CT imaging of the upper right extremity was performed according to the standard protocol. RADIATION DOSE REDUCTION: This exam was performed according to the departmental dose-optimization program which includes automated exposure control, adjustment of the mA and/or kV according to patient size and/or use of iterative reconstruction technique. COMPARISON:  Right shoulder x-ray earlier same day FINDINGS: Bones/Joint/Cartilage There is an acute comminuted fracture of the anterior inferior glenoid. Fracture fragments are distracted 6 mm. There is no dislocation. No focal osseous lesion identified. Ligaments Suboptimally assessed by CT. Muscles and Tendons No intramuscular hematoma. Soft tissues There is deep soft tissue edema surrounding the humeral head. Small joint effusion present. IMPRESSION: Acute comminuted fracture of the anterior inferior glenoid. Electronically Signed   By: Darliss Cheney M.D.   On: 02/09/2023 17:17   CT Hip Right Wo Contrast  Result Date: 02/09/2023 CLINICAL DATA:  Fall EXAM: CT OF THE RIGHT HIP WITHOUT CONTRAST TECHNIQUE: Multidetector CT imaging of the right hip was performed according to the standard protocol. Multiplanar CT image reconstructions were also generated. RADIATION DOSE REDUCTION: This exam was  performed according to the departmental dose-optimization program which includes automated exposure control, adjustment of the mA and/or kV according to patient size and/or use of iterative reconstruction technique. COMPARISON:  Right hip x-ray same day FINDINGS: Bones/Joint/Cartilage The bones are osteopenic. There is an acute fracture of the right femoral neck. There is 1 cm of superior displacement of the distal fracture fragment with apex superolateral angulation. There is no dislocation. No additional fractures are identified. Ligaments Suboptimally assessed by CT. Muscles and Tendons No intramuscular hematoma. Soft tissues Lipohemarthrosis is noted in the right hip. There is mild deep soft tissue edema surrounding the right hip. IMPRESSION: 1. Acute angulated and displaced fracture of the right femoral neck. 2. Lipohemarthrosis in the right hip. Electronically Signed   By: Darliss Cheney M.D.   On: 02/09/2023 17:15   DG Hip Unilat W or Wo Pelvis 2-3 Views Right  Result Date: 02/09/2023 CLINICAL DATA:  Fall. EXAM: DG HIP (WITH OR WITHOUT PELVIS) 2-3V RIGHT COMPARISON:  None Available. FINDINGS: Bones are diffusely demineralized. SI joints and symphysis pubis unremarkable. Right femoral neck fracture with varus angulation. Fracture line is not well demonstrated but is probably transcervical to basicervical. There is some lucency in the humeral head raising the question of pathologic fracture. IMPRESSION: 1. Right femoral neck fracture with varus angulation. 2. Lucency in the femoral head raises the question of pathologic fracture. Electronically Signed   By: Kennith Center M.D.   On: 02/09/2023 15:27   DG Shoulder Right  Result Date: 02/09/2023 CLINICAL DATA:  Fall EXAM: RIGHT SHOULDER - 2+ VIEW COMPARISON:  None Available. FINDINGS: The bones are osteopenic. There is some linear lucencies through the inferior glenoid which may be secondary to degenerative change; however, acute nondisplaced fractures not  excluded. Joint spaces are well maintained and alignment is anatomic. There are minimal degenerative changes of the acromioclavicular joint. The soft tissues are within normal limits. IMPRESSION: 1. Linear lucencies through the inferior glenoid which may be secondary to degenerative change; however, acute nondisplaced fractures not excluded. 2. Osteopenia. Electronically Signed   By: Darliss Cheney M.D.   On: 02/09/2023 15:26   DG Elbow Complete Right  Result Date: 02/09/2023 CLINICAL DATA:  Fall.  Hematoma noted to right elbow. EXAM: RIGHT ELBOW - COMPLETE 3+ VIEW COMPARISON:  None Available. FINDINGS: There is no evidence of fracture, dislocation, or joint effusion. No focal bone abnormality. Soft  tissue swelling is noted overlying the posterior aspect of the elbow/proximal forearm. IMPRESSION: No fracture or dislocation of the right elbow. Electronically Signed   By: Sherron Ales M.D.   On: 02/09/2023 15:25   DG Chest 1 View  Result Date: 02/09/2023 CLINICAL DATA:  Injury EXAM: CHEST  1 VIEW COMPARISON:  Chest x-ray 10/23/2021 FINDINGS: The heart is enlarged, unchanged. The lungs are clear. There is no pleural effusion or pneumothorax. No acute fractures are identified. IMPRESSION: Cardiomegaly without acute cardiopulmonary process. Electronically Signed   By: Darliss Cheney M.D.   On: 02/09/2023 15:24     Positive ROS: All other systems have been reviewed and were otherwise negative with the exception of those mentioned in the HPI and as above.  Physical Exam: BP 102/85 (BP Location: Left Arm)   Pulse 99   Temp 98.3 F (36.8 C) (Oral)   Resp 16   Ht 5\' 1"  (1.549 m)   Wt 59 kg   SpO2 95%   BMI 24.58 kg/m  General:  Alert, no acute distress Psychiatric:  Patient is competent for consent with normal mood and affect     Orthopedic Exam:  RLE: + DF/PF/EHL SILT grossly over foot Foot wwp +Log roll/axial load Leg shortened and externally rotated  RUE: +ain/pin/u motor SILT  r/u/m/ax +rad pulse RoM shoulder: able to FF to 90 degrees without pain, Able to ER with arm by side to 45 degrees without pain. Pain with Abd/ER.    Imaging:  As above: R displaced femoral neck fracture, R anterior/inferior glenoid rim fracture  Assessment/Plan: GENIYAH LOERCH is a 82 y.o. female with a R displaced femoral neck fracture and R anterior/inferior glenoid rim fracture   1. I discussed the various treatment options including both surgical and non-surgical management of the hip fracture with the patient and/or family (medical PoA). We discussed the high risk of perioperative complications due to patient's age and other co-morbidities. After discussion of risks, benefits, and alternatives to surgery, the family and/or patient were in agreement to proceed with surgery. The goals of surgery would be to provide adequate pain relief and allow for mobilization. Plan for surgery is R hip hemiarthroplasty later today 2. Regarding the R glenoid rim fracture, we will plan for an exam under anesthesia in the OR to ensure stability of the glenohumeral joint. If the shoulder remains stable, we will plan for continued nonoperative management.  3. NPO until OR 4. Hold anticoagulation in advance of OR 5. Patient evaluated by Cardiology team given new onset a-fib. Deemed to be medically optimized for hip fracture surgery.    Signa Kell   02/10/2023 11:42 AM

## 2023-02-10 NOTE — Progress Notes (Signed)
Progress Note   Patient: Karen Henson ZOX:[id number] DOB: 25-Mar-1941 DOA: 02/09/2023     1 DOS: the patient was seen and examined on 02/10/2023   Brief hospital course: 82 year old female with history of COPD, hypertension, idiopathic pulmonary fibrosis, osteoporosis presents to the ED after a fall while playing T-ball with her grandson.  Landed on the cement with her right hip and right shoulder.  Found to have a right femoral neck fracture.  In ER also found to be in rapid atrial fibrillation.  5/12.  Blood pressure low this morning.  Antihypertensive medications held.  Patient given a fluid bolus.  Patient still in atrial fibrillation.    Assessment and Plan: * Closed right hip fracture [facility]) Operating room on 5/12 by Dr. Allena Katz.  Pain control.  Hypotension Held antihypertensive medications this morning.  Fluid bolus ordered.  Atrial fibrillation with RVR (HCC) New onset atrial fibrillation with intermittent episodes of RVR.  CHA2DS2-VASc elevated at 4 (Age, sex, history of hypertension).  Once cleared by orthopedics will likely start on Eliquis.  Toprol-XL at night if blood pressure allowable.   COPD (chronic obstructive pulmonary disease) (HCC) - DuoNebs as needed for shortness of breath and wheezing - Continue home montelukast at bedtime  Fibrosis lung (HCC) History of IPF with no prior exacerbations.   Essential hypertension Holding Hyzaar with hypotension this morning.  Held metoprolol.  Will change metoprolol to Toprol-XL at night if blood pressure allows.        Subjective: Patient seen this morning.  Having pain in her hip.  Patient receiving pain medications and had a low blood pressure.  Her antihypertensive medications were held this morning and IV fluid bolus was ordered.  Physical Exam: Vitals:   02/10/23 0448 02/10/23 0813 02/10/23 0815 02/10/23 1141  BP: 117/83  102/85 (!) 85/69  Pulse: 96  99 74  Resp:   16 16  Temp: 98.4 F (36.9 C)  98.3 F (36.8  C) 97.6 F (36.4 C)  TempSrc:   Oral Oral  SpO2: (!) 89% (!) 86% 95% 94%  Weight:      Height:       Physical Exam HENT:     Head: Normocephalic.     Mouth/Throat:     Pharynx: No oropharyngeal exudate.  Eyes:     General: Lids are normal.     Conjunctiva/sclera: Conjunctivae normal.  Cardiovascular:     Rate and Rhythm: Normal rate. Rhythm irregularly irregular.     Heart sounds: Normal heart sounds, S1 normal and S2 normal.  Pulmonary:     Breath sounds: Examination of the right-lower field reveals decreased breath sounds. Examination of the left-lower field reveals decreased breath sounds. Decreased breath sounds present. No wheezing, rhonchi or rales.  Abdominal:     Palpations: Abdomen is soft.     Tenderness: There is no abdominal tenderness.  Musculoskeletal:     Right lower leg: No swelling.     Left lower leg: No swelling.     Comments: Right leg shortened and externally rotated.  Skin:    General: Skin is warm.     Findings: No rash.  Neurological:     Mental Status: She is alert and oriented to person, place, and time.     Data Reviewed: Sodium 132 creatinine 1.21, phosphorus 6.4, magnesium 2.1, potassium 4.6.  White blood cell count 11.3, hemoglobin 13.6, platelet count 281 X-ray shows a right femoral neck fracture, no fracture of the right elbow Chest x-ray shows cardiomegaly Echocardiogram  official read still pending  Family Communication: Back with family at the bedside  Disposition: Status is: Inpatient Remains inpatient appropriate because: Operating room today  Planned Discharge Destination: Home    Time spent: 28 minutes  Author: Alford Highland, MD 02/10/2023 1:22 PM  For on call review [email or web address].

## 2023-02-10 NOTE — Anesthesia Postprocedure Evaluation (Signed)
Anesthesia Post Note  Patient: MAYELI SUNDARA  Procedure(s) Performed: ARTHROPLASTY BIPOLAR HIP (HEMIARTHROPLASTY) (Right: Hip)  Patient location during evaluation: PACU Anesthesia Type: Spinal Level of consciousness: awake and alert and confused Pain management: pain level controlled Vital Signs Assessment: post-procedure vital signs reviewed and stable Respiratory status: spontaneous breathing, nonlabored ventilation, respiratory function stable and patient connected to nasal cannula oxygen Cardiovascular status: blood pressure returned to baseline and stable Postop Assessment: no apparent nausea or vomiting and spinal receding Anesthetic complications: no   No notable events documented.   Last Vitals:  Vitals:   02/10/23 1617 02/10/23 1957  BP: 102/73 92/72  Pulse: 78 62  Resp: 16 16  Temp: 36.5 C 36.6 C  SpO2: 95% 97%    Last Pain:  Vitals:   02/10/23 1945  TempSrc:   PainSc: 4                  Foye Deer

## 2023-02-10 NOTE — Progress Notes (Signed)
Consent completed and placed in chart  

## 2023-02-10 NOTE — Progress Notes (Signed)
*  PRELIMINARY RESULTS* Echocardiogram 2D Echocardiogram has been performed.  Cristela Blue 02/10/2023, 11:25 AM

## 2023-02-10 NOTE — Transfer of Care (Signed)
Immediate Anesthesia Transfer of Care Note  Patient: Karen Henson  Procedure(s) Performed: ARTHROPLASTY BIPOLAR HIP (HEMIARTHROPLASTY) (Right: Hip)  Patient Location: PACU  Anesthesia Type:Spinal  Level of Consciousness: drowsy  Airway & Oxygen Therapy: Patient Spontanous Breathing and Patient connected to face mask oxygen  Post-op Assessment: Report given to RN  Post vital signs: stable  Last Vitals:  Vitals Value Taken Time  BP 104/76 02/10/23 1446  Temp 37.1 C 02/10/23 1443  Pulse 93 02/10/23 1450  Resp 14 02/10/23 1450  SpO2 97 % 02/10/23 1450  Vitals shown include unvalidated device data.  Last Pain:  Vitals:   02/10/23 1443  TempSrc:   PainSc: 0-No pain         Complications: No notable events documented.

## 2023-02-10 NOTE — Anesthesia Procedure Notes (Signed)
Spinal  Patient location during procedure: OR Start time: 02/10/2023 12:30 PM End time: 02/10/2023 12:45 PM Reason for block: surgical anesthesia Staffing Performed: anesthesiologist  Anesthesiologist: Foye Deer, MD Performed by: Foye Deer, MD Authorized by: Foye Deer, MD   Preanesthetic Checklist Completed: patient identified, IV checked, site marked, risks and benefits discussed, surgical consent, monitors and equipment checked, pre-op evaluation and timeout performed Spinal Block Patient position: sitting Prep: DuraPrep Patient monitoring: heart rate, cardiac monitor, continuous pulse ox and blood pressure Approach: midline Location: L3-4 Injection technique: single-shot Needle Needle type: Pencan  Needle gauge: 24 G Needle length: 9 cm Assessment Sensory level: T10 Events: CSF return

## 2023-02-11 ENCOUNTER — Other Ambulatory Visit (HOSPITAL_COMMUNITY): Payer: Self-pay

## 2023-02-11 ENCOUNTER — Encounter: Payer: Self-pay | Admitting: Orthopedic Surgery

## 2023-02-11 DIAGNOSIS — I4891 Unspecified atrial fibrillation: Secondary | ICD-10-CM | POA: Diagnosis not present

## 2023-02-11 DIAGNOSIS — I9589 Other hypotension: Secondary | ICD-10-CM | POA: Diagnosis not present

## 2023-02-11 DIAGNOSIS — S72001S Fracture of unspecified part of neck of right femur, sequela: Secondary | ICD-10-CM | POA: Diagnosis not present

## 2023-02-11 DIAGNOSIS — J449 Chronic obstructive pulmonary disease, unspecified: Secondary | ICD-10-CM | POA: Diagnosis not present

## 2023-02-11 DIAGNOSIS — I10 Essential (primary) hypertension: Secondary | ICD-10-CM | POA: Diagnosis not present

## 2023-02-11 DIAGNOSIS — W19XXXS Unspecified fall, sequela: Secondary | ICD-10-CM

## 2023-02-11 LAB — CBC
HCT: 37.1 % (ref 36.0–46.0)
Hemoglobin: 12 g/dL (ref 12.0–15.0)
MCH: 31.5 pg (ref 26.0–34.0)
MCHC: 32.3 g/dL (ref 30.0–36.0)
MCV: 97.4 fL (ref 80.0–100.0)
Platelets: 197 10*3/uL (ref 150–400)
RBC: 3.81 MIL/uL — ABNORMAL LOW (ref 3.87–5.11)
RDW: 14.5 % (ref 11.5–15.5)
WBC: 10 10*3/uL (ref 4.0–10.5)
nRBC: 0 % (ref 0.0–0.2)

## 2023-02-11 LAB — BASIC METABOLIC PANEL
Anion gap: 9 (ref 5–15)
BUN: 38 mg/dL — ABNORMAL HIGH (ref 8–23)
CO2: 22 mmol/L (ref 22–32)
Calcium: 8.1 mg/dL — ABNORMAL LOW (ref 8.9–10.3)
Chloride: 99 mmol/L (ref 98–111)
Creatinine, Ser: 1.44 mg/dL — ABNORMAL HIGH (ref 0.44–1.00)
GFR, Estimated: 37 mL/min — ABNORMAL LOW (ref >60)
Glucose, Bld: 120 mg/dL — ABNORMAL HIGH (ref 70–99)
Potassium: 4.6 mmol/L (ref 3.5–5.1)
Sodium: 130 mmol/L — ABNORMAL LOW (ref 135–145)

## 2023-02-11 MED ORDER — ENOXAPARIN SODIUM 40 MG/0.4ML IJ SOSY: 40.0000 mg | PREFILLED_SYRINGE | INTRAMUSCULAR | 0 refills | Status: DC

## 2023-02-11 MED ORDER — OXYCODONE HCL 5 MG PO TABS: 2.5000 mg | ORAL_TABLET | ORAL | 0 refills | Status: DC | PRN

## 2023-02-11 MED ORDER — ENOXAPARIN SODIUM 30 MG/0.3ML IJ SOSY: 30.0000 mg | PREFILLED_SYRINGE | INTRAMUSCULAR | Status: DC

## 2023-02-11 MED ORDER — ONDANSETRON HCL 4 MG PO TABS: 4.0000 mg | ORAL_TABLET | Freq: Four times a day (QID) | ORAL | 0 refills | Status: DC | PRN

## 2023-02-11 MED ORDER — TRAMADOL HCL 50 MG PO TABS: 50.0000 mg | ORAL_TABLET | Freq: Four times a day (QID) | ORAL | 0 refills | Status: DC | PRN

## 2023-02-11 MED ORDER — APIXABAN 2.5 MG PO TABS
2.5000 mg | ORAL_TABLET | Freq: Two times a day (BID) | ORAL | Status: DC
  Administered 2023-02-11 – 2023-02-13 (×4): 2.5 mg via ORAL
  Filled 2023-02-11 (×4): qty 1

## 2023-02-11 NOTE — Progress Notes (Signed)
  Subjective: 1 Day Post-Op Procedure(s) (LRB): ARTHROPLASTY BIPOLAR HIP (HEMIARTHROPLASTY) (Right) and right shoulder glenoid rim fracture with closed reduction. Patient reports pain as mild.   Patient is well, and has had no acute complaints or problems Plan is to go Rehab versus home after hospital stay. Negative for chest pain and shortness of breath Fever: no Gastrointestinal: Negative for nausea and vomiting  Objective: Vital signs in last 24 hours: Temp:  [97.6 F (36.4 C)-99.4 F (37.4 C)] 97.9 F (36.6 C) (05/13 0502) Pulse Rate:  [51-99] 71 (05/13 0502) Resp:  [9-21] 16 (05/13 0044) BP: (85-107)/(67-85) 107/81 (05/13 0502) SpO2:  [86 %-99 %] 98 % (05/13 0502)  Intake/Output from previous day:  Intake/Output Summary (Last 24 hours) at 02/11/2023 0633 Last data filed at 02/11/2023 0600 Gross per 24 hour  Intake 1576.25 ml  Output 950 ml  Net 626.25 ml    Intake/Output this shift: Total I/O In: 1026.3 [P.O.:240; I.V.:686.3; IV Piggyback:100] Out: 950 [Urine:950]  Labs: Recent Labs    02/09/23 1520 02/10/23 0524 02/11/23 0433  HGB 13.6 13.6 12.0   Recent Labs    02/10/23 0524 02/11/23 0433  WBC 11.3* 10.0  RBC 4.34 3.81*  HCT 41.9 37.1  PLT 281 197   Recent Labs    02/10/23 0524 02/11/23 0433  NA 132* 130*  K 4.6 4.6  CL 99 99  CO2 24 22  BUN 23 38*  CREATININE 1.21* 1.44*  GLUCOSE 129* 120*  CALCIUM 8.7* 8.1*   No results for input(s): "LABPT", "INR" in the last 72 hours.   EXAM General - Patient is Alert and Oriented Extremity - Neurovascular intact Sensation intact distally Dorsiflexion/Plantar flexion intact Compartment soft with the abduction brace in place. The right arm has limited use with difficulty and discomfort with gentle motion.  Good grip strength. Dressing/Incision - clean, dry, no drainage Motor Function - intact, moving foot and toes well on exam.  Grip strength intact.  Past Medical History:  Diagnosis Date    Allergy    Environmental Allergies   Arthritis    Bronchitis, chronic (HCC)    COPD (chronic obstructive pulmonary disease) (HCC)    Cough    CHRONIC   Dyspnea    OCCAS   Hypertension    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HCC)    Osteoporosis     Assessment/Plan: 1 Day Post-Op Procedure(s) (LRB): ARTHROPLASTY BIPOLAR HIP (HEMIARTHROPLASTY) (Right) Principal Problem:   Closed right hip fracture (HCC) Active Problems:   Essential hypertension   Fibrosis lung (HCC)   Atrial fibrillation with RVR (HCC)   COPD (chronic obstructive pulmonary disease) (HCC)   Fall   Hypotension  Estimated body mass index is 24.58 kg/m as calculated from the following:   Height as of this encounter: 5\' 1"  (1.549 m).   Weight as of this encounter: 59 kg. Advance diet Up with therapy D/C IV fluids  Discharge planning.  Plan to follow-up at [facility] clinic orthopedics in 2 weeks for x-ray of the right shoulder and right hip.  Staple removal.  DVT Prophylaxis - Lovenox, Foot Pumps, and TED hose Weight-Bearing as tolerated to right leg  Dedra Skeens, PA-C Orthopaedic Surgery 02/11/2023, 6:33 AM

## 2023-02-11 NOTE — Discharge Instructions (Signed)
POSTERIOR TOTAL HIP REPLACEMENT POSTOPERATIVE DIRECTIONS  Hip Rehabilitation, Guidelines Following Surgery  The results of a hip operation are greatly improved after range of motion and muscle strengthening exercises. Follow all safety measures which are given to protect your hip. If any of these exercises cause increased pain or swelling in your joint, decrease the amount until you are comfortable again. Then slowly increase the exercises. Call your caregiver if you have problems or questions.   HOME CARE INSTRUCTIONS  Remove items at home which could result in a fall. This includes throw rugs or furniture in walking pathways.  ICE to the affected hip every three hours for 30 minutes at a time and then as needed for pain and swelling.  Continue to use ice on the hip for pain and swelling from surgery. You may notice swelling that will progress down to the foot and ankle.  This is normal after surgery.  Elevate the leg when you are not up walking on it.   Continue to use the breathing machine which will help keep your temperature down.  It is common for your temperature to cycle up and down following surgery, especially at night when you are not up moving around and exerting yourself.  The breathing machine keeps your lungs expanded and your temperature down.  DIET You may resume your previous home diet once your are discharged from the hospital.  DRESSING / WOUND CARE / SHOWERING You may change your dressing 3-5 days after surgery.  Then change the dressing every day with sterile gauze.  Please use good hand washing techniques before changing the dressing.  Do not use any lotions or creams on the incision until instructed by your surgeon. You need to keep your dressing dry after discharge.   Change the surgical dressing if needed with Physical Therapy and reapply a dry dressing each time.    ACTIVITY Walk with your walker as instructed. Use walker as long as suggested by your  caregivers. Avoid periods of inactivity such as sitting longer than an hour when not asleep. This helps prevent blood clots.  You may resume a sexual relationship in one month or when given the OK by your doctor.  You may return to work once you are cleared by your doctor.  Do not drive a car for 6 weeks or until released by you surgeon.  Do not drive while taking narcotics.  WEIGHT BEARING Weight bearing as tolerated with assist device (walker, cane, etc) as directed, use it as long as suggested by your surgeon or therapist, typically at least 4-6 weeks.  The right upper extremity is nonweightbearing, although the patient is able to do ADLs.  Gentle motion with the right shoulder.  POSTOPERATIVE CONSTIPATION PROTOCOL Constipation - defined medically as fewer than three stools per week and severe constipation as less than one stool per week.  One of the most common issues patients have following surgery is constipation.  Even if you have a regular bowel pattern at home, your normal regimen is likely to be disrupted due to multiple reasons following surgery.  Combination of anesthesia, postoperative narcotics, change in appetite and fluid intake all can affect your bowels.  In order to avoid complications following surgery, here are some recommendations in order to help you during your recovery period.  Colace (docusate) - Pick up an over-the-counter form of Colace or another stool softener and take twice a day as long as you are requiring postoperative pain medications.  Take with a full glass  of water daily.  If you experience loose stools or diarrhea, hold the colace until you stool forms back up.  If your symptoms do not get better within 1 week or if they get worse, check with your doctor.  Dulcolax (bisacodyl) - Pick up over-the-counter and take as directed by the product packaging as needed to assist with the movement of your bowels.  Take with a full glass of water.  Use this product as  needed if not relieved by Colace only.   MiraLax (polyethylene glycol) - Pick up over-the-counter to have on hand.  MiraLax is a solution that will increase the amount of water in your bowels to assist with bowel movements.  Take as directed and can mix with a glass of water, juice, soda, coffee, or tea.  Take if you go more than two days without a movement. Do not use MiraLax more than once per day. Call your doctor if you are still constipated or irregular after using this medication for 7 days in a row.  If you continue to have problems with postoperative constipation, please contact the office for further assistance and recommendations.  If you experience "the worst abdominal pain ever" or develop nausea or vomiting, please contact the office immediatly for further recommendations for treatment.  ITCHING  If you experience itching with your medications, try taking only a single pain pill, or even half a pain pill at a time.  You can also use Benadryl over the counter for itching or also to help with sleep.   TED HOSE STOCKINGS Wear the elastic stockings on both legs for three weeks following surgery during the day but you may remove then at night for sleeping.  MEDICATIONS See your medication summary on the "After Visit Summary" that the nursing staff will review with you prior to discharge.  You may have some home medications which will be placed on hold until you complete the course of blood thinner medication.  It is important for you to complete the blood thinner medication as prescribed by your surgeon.  Continue your approved medications as instructed at time of discharge.  PRECAUTIONS If you experience chest pain or shortness of breath - call 911 immediately for transfer to the hospital emergency department.  If you develop a fever greater that 101 F, purulent drainage from wound, increased redness or drainage from wound, foul odor from the wound/dressing, or calf pain - CONTACT YOUR  SURGEON.                                                   FOLLOW-UP APPOINTMENTS Make sure you keep all of your appointments after your operation with your surgeon and caregivers. You should call the office at the above phone number and make an appointment for approximately two weeks after the date of your surgery or on the date instructed by your surgeon outlined in the "After Visit Summary".  RANGE OF MOTION AND STRENGTHENING EXERCISES  These exercises are designed to help you keep full movement of your hip joint. Follow your caregiver's or physical therapist's instructions. Perform all exercises about fifteen times, three times per day or as directed. Exercise both hips, even if you have had only one joint replacement. These exercises can be done on a training (exercise) mat, on the floor, on a table or on a bed. Use  whatever works the best and is most comfortable for you. Use music or television while you are exercising so that the exercises are a pleasant break in your day. This will make your life better with the exercises acting as a break in routine you can look forward to.  Lying on your back, slowly slide your foot toward your buttocks, raising your knee up off the floor. Then slowly slide your foot back down until your leg is straight again.  Lying on your back spread your legs as far apart as you can without causing discomfort.  Lying on your side, raise your upper leg and foot straight up from the floor as far as is comfortable. Slowly lower the leg and repeat.  Lying on your back, tighten up the muscle in the front of your thigh (quadriceps muscles). You can do this by keeping your leg straight and trying to raise your heel off the floor. This helps strengthen the largest muscle supporting your knee.  Lying on your back, tighten up the muscles of your buttocks both with the legs straight and with the knee bent at a comfortable angle while keeping your heel on the floor.      IF YOU ARE  TRANSFERRED TO A SKILLED REHAB FACILITY If the patient is transferred to a skilled rehab facility following release from the hospital, a list of the current medications will be sent to the facility for the patient to continue.  When discharged from the skilled rehab facility, please have the facility set up the patient's Home Health Physical Therapy prior to being released. Also, the skilled facility will be responsible for providing the patient with their medications at time of release from the facility to include their pain medication, the muscle relaxants, and their blood thinner medication. If the patient is still at the rehab facility at time of the two week follow up appointment, the skilled rehab facility will also need to assist the patient in arranging follow up appointment in our office and any transportation needs.  MAKE SURE YOU:  Understand these instructions.  Get help right away if you are not doing well or get worse.    Pick up stool softner and laxative for home use following surgery while on pain medications. Do not submerge incision under water. Please use good hand washing techniques while changing dressing each day. May shower starting three days after surgery. Please use a clean towel to pat the incision dry following showers. Continue to use ice for pain and swelling after surgery. Do not use any lotions or creams on the incision until instructed by your surgeon.

## 2023-02-11 NOTE — Consult Note (Signed)
ANTICOAGULATION CONSULT NOTE - Initial Consult  Pharmacy Consult for Apixaban  Indication: atrial fibrillation  Allergies  Allergen Reactions   Lisinopril Cough    Patient Measurements: Height: 5\' 1"  (154.9 cm) Weight: 59 kg (130 lb 1.1 oz) IBW/kg (Calculated) : 47.8  Vital Signs: Temp: 97.8 F (36.6 C) (05/13 0809) Temp Source: Oral (05/13 0502) BP: 111/82 (05/13 0809) Pulse Rate: 75 (05/13 0809)  Labs: Recent Labs    02/09/23 1520 02/10/23 0524 02/11/23 0433  HGB 13.6 13.6 12.0  HCT 41.6 41.9 37.1  PLT 262 281 197  CREATININE 0.78 1.21* 1.44*    Estimated Creatinine Clearance: 25.3 mL/min (A) (by C-G formula based on SCr of 1.44 mg/dL (H)).   Medical History: Past Medical History:  Diagnosis Date   Allergy    Environmental Allergies   Arthritis    Bronchitis, chronic (HCC)    COPD (chronic obstructive pulmonary disease) (HCC)    Cough    CHRONIC   Dyspnea    OCCAS   Hypertension    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HCC)    Osteoporosis     Assessment: Patient admitted for closed hip fracture. PMH includes COPD, hypertension, idiopathic pulmonary fibrosis, osteoporosis. Noted new onset Afib manage by cardiology. Pharmacy consulted to start full dose AC with Eliquis for Afib and dc enoxaparin for DVT prophylaxis.  CHA2DS2-VASc Score = 4 . This indicates a 4.8% annual risk of stroke. The patient's score is based upon: CHF History: 0 HTN History: 1 Diabetes History: 0 Stroke History: 0 Vascular Disease History: 0 Age Score: 2 Gender Score: 1    Plan:  DC enoxaparin Start Eliquis 2.5mg  po BID for A.Fib stroke prevention (reduced does d/t age >25, weight < 60kg)  Azrielle Springsteen Rodriguez-Guzman PharmD, BCPS 02/11/2023 2:56 PM

## 2023-02-11 NOTE — Evaluation (Signed)
Occupational Therapy Evaluation Patient Details Name: Karen Henson MRN: [id number] DOB: 01/03/1941 Today's Date: 02/11/2023   History of Present Illness Pt is an 82 y.o. female presenting to hospital s/p fall (palying T-ball with great grandchild) c/o R hip, shoulder, and elbow pain.  Imaging showing R displaced femoral neck fx and R anterior/inferior glenoid rim fx.  Pt admitted with closed R hip fx, R shoulder fx, a-fib with RVR, and ground level fall.  S/p R hip hemiarthroplasty and R shoulder exam under anesthesia (closed management of glenoid fx) 02/10/23.   PMH includes COPD, htn, idiopathic pulmonary fibrosis, osteoporosis, breast sx.   Clinical Impression   Patient presenting with decreased Ind in self care, balance, functional mobility/transfers, endurance, and safety awareness. Patient reports being Ind at baseline and living at home alone. Family lives nearby to offer intermittent assistance as needed.  Patient currently functioning at +2 assistance for bed mobility and step pivot transfer from bed>recliner chair with use of RW. Pt needing max multimodal cuing to sequence and initiate while maintaining precautions. Total A to don B socks. Patient will benefit from acute OT to increase overall independence in the areas of ADLs, functional mobility, and safety awareness in order to safely discharge.     Recommendations for follow up therapy are one component of a multi-disciplinary discharge planning process, led by the attending physician.  Recommendations may be updated based on patient status, additional functional criteria and insurance authorization.   Assistance Recommended at Discharge Intermittent Supervision/Assistance  Patient can return home with the following Two people to help with walking and/or transfers;A lot of help with bathing/dressing/bathroom;Assistance with cooking/housework;Assist for transportation;Help with stairs or ramp for entrance    Functional Status  Assessment  Patient has had a recent decline in their functional status and demonstrates the ability to make significant improvements in function in a reasonable and predictable amount of time.  Equipment Recommendations  Other (comment) (defer to next venue of care)    Recommendations for Other Services       Precautions / Restrictions Precautions Precautions: Shoulder;Posterior Hip;Fall Shoulder Interventions: Shoulder sling/immobilizer;At all times;For comfort Precaution Booklet Issued: Yes (comment) Precaution Comments: R KI at all times except when in CPM/PT Required Braces or Orthoses: Knee Immobilizer - Right Restrictions Weight Bearing Restrictions: Yes RUE Weight Bearing: Non weight bearing RLE Weight Bearing: Weight bearing as tolerated Other Position/Activity Restrictions: Per ortho surgical note 02/10/23: "The patient will be WBAT on the RLE. Sling to RUE for comfort. Recommend NWB on RUE, but patient may use RUE to perform ADLs".      Mobility Bed Mobility Overal bed mobility: Needs Assistance Bed Mobility: Supine to Sit     Supine to sit: Mod assist, Max assist, +2 for physical assistance, HOB elevated     General bed mobility comments: assist for trunk and B LE's; vc's for technique and posterior THP's    Transfers Overall transfer level: Needs assistance Equipment used: Hemi-walker Transfers: Sit to/from Stand, Bed to chair/wheelchair/BSC Sit to Stand: Mod assist, +2 physical assistance     Step pivot transfers: Mod assist, +2 physical assistance     General transfer comment: vc's for L hand and B LE placement for transfers; assist to initiate stand and control descent sitting; vc's for hemi-walker use; assist for balance initially in standing d/t posterior lean (vc's to shift weight forward required); stand step turn bed to recliner with hemi-walker use L UE with assist/cueing for weight-shifting and overall technique and hemi-walker use (max multi-modal  cueing required)      Balance Overall balance assessment: Needs assistance Sitting-balance support: Single extremity supported, Feet supported Sitting balance-Leahy Scale: Fair     Standing balance support: Single extremity supported, Reliant on assistive device for balance Standing balance-Leahy Scale: Poor                             ADL either performed or assessed with clinical judgement   ADL Overall ADL's : Needs assistance/impaired     Grooming: Wash/dry face;Sitting;Set up       Lower Body Bathing: Total assistance Lower Body Bathing Details (indicate cue type and reason): to don B socks         Toilet Transfer: +2 for physical assistance;+2 for safety/equipment;Moderate assistance Toilet Transfer Details (indicate cue type and reason): simulated with hemi walker                 Vision Patient Visual Report: No change from baseline              Pertinent Vitals/Pain Pain Assessment Pain Assessment: 0-10 Pain Score: 5  Pain Location: R shoulder and R hip Pain Descriptors / Indicators: Aching, Sore Pain Intervention(s): Limited activity within patient's tolerance, Monitored during session, Premedicated before session, Repositioned     Hand Dominance Right   Extremity/Trunk Assessment Upper Extremity Assessment Upper Extremity Assessment: RUE deficits/detail RUE Deficits / Details: in sling for comfort and NWB   Lower Extremity Assessment Lower Extremity Assessment: Defer to PT evaluation RLE Deficits / Details: 3-/5 hip flexion; at least 3/5 AROM knee flexion/extension and DF RLE: Unable to fully assess due to pain   Cervical / Trunk Assessment Cervical / Trunk Assessment: Normal   Communication Communication Communication: No difficulties   Cognition Arousal/Alertness: Awake/alert Behavior During Therapy: Anxious Overall Cognitive Status: Within Functional Limits for tasks assessed                                                   Home Living Family/patient expects to be discharged to:: Private residence Living Arrangements: Alone Available Help at Discharge: Family;Available PRN/intermittently Type of Home: House Home Access: Ramped entrance     Home Layout: One level     Bathroom Shower/Tub: Chief Strategy Officer: Standard     Home Equipment: Cane - single point;Shower seat;Grab bars - tub/shower;Grab bars - toilet   Additional Comments: Has family member's RW (who was taller than pt).      Prior Functioning/Environment Prior Level of Function : Independent/Modified Independent             Mobility Comments: No other recent falls reported.          OT Problem List: Decreased strength;Decreased activity tolerance;Decreased safety awareness;Impaired balance (sitting and/or standing);Decreased knowledge of use of DME or AE;Pain;Decreased range of motion;Impaired UE functional use;Decreased knowledge of precautions      OT Treatment/Interventions: Self-care/ADL training;Therapeutic exercise;Therapeutic activities;Energy conservation;DME and/or AE instruction;Patient/family education;Balance training    OT Goals(Current goals can be found in the care plan section) Acute Rehab OT Goals Patient Stated Goal: to get stronger OT Goal Formulation: With patient/family Time For Goal Achievement: 02/25/23 Potential to Achieve Goals: Good ADL Goals Pt Will Perform Grooming: with set-up;sitting Pt Will Perform Lower Body Dressing: with min assist;sit to/from stand Pt Will  Transfer to Toilet: with min assist;bedside commode;stand pivot transfer Pt Will Perform Toileting - Clothing Manipulation and hygiene: with min assist;sit to/from stand  OT Frequency: Min 2X/week    Co-evaluation PT/OT/SLP Co-Evaluation/Treatment: Yes Reason for Co-Treatment: For patient/therapist safety;To address functional/ADL transfers;Complexity of the patient's impairments (multi-system  involvement) PT goals addressed during session: Mobility/safety with mobility;Balance;Proper use of DME OT goals addressed during session: ADL's and self-care      AM-PAC OT "6 Clicks" Daily Activity     Outcome Measure Help from another person eating meals?: A Little Help from another person taking care of personal grooming?: A Little Help from another person toileting, which includes using toliet, bedpan, or urinal?: Total Help from another person bathing (including washing, rinsing, drying)?: A Lot Help from another person to put on and taking off regular upper body clothing?: A Lot Help from another person to put on and taking off regular lower body clothing?: Total 6 Click Score: 12   End of Session Equipment Utilized During Treatment: Other (comment) (hemi walker and R shoulder sling) Nurse Communication: Mobility status;Other (comment) (requesting different shoulder sling and KI for R LE)  Activity Tolerance: Patient tolerated treatment well Patient left: with call bell/phone within reach;in chair;with chair alarm set  OT Visit Diagnosis: Unsteadiness on feet (R26.81);Repeated falls (R29.6);Muscle weakness (generalized) (M62.81)                Time: 1610-9604 OT Time Calculation (min): 31 min Charges:  OT General Charges $OT Visit: 1 Visit OT Evaluation $OT Eval Moderate Complexity: 1 [address] [place], OTR/L , CBIS ascom [phone]  02/11/23, 2:02 PM

## 2023-02-11 NOTE — [place] FL2 (Signed)
[place] MEDICAID FL2 LEVEL OF CARE FORM     IDENTIFICATION  Patient Name: Karen Henson Birthdate: Aug 19, 1941 Sex: female Admission Date (Current Location): 02/09/2023  [facility] and [place] Number:  Chiropodist and Address:  [facility], [address] [place], [place] [address]      Provider Number: [id number]  Attending Physician Name and Address:  Alford Highland, MD  Relative Name and Phone Number:  Alvis Lemmings [phone]    Current Level of Care: Hospital Recommended Level of Care: Skilled Nursing Facility Prior Approval Number:    Date Approved/Denied:   PASRR Number: [id number]  Discharge Plan: SNF    Current Diagnoses: Patient Active Problem List   Diagnosis Date Noted   Fall 02/10/2023   Hypotension 02/10/2023   Closed right hip fracture (HCC) 02/09/2023   Atrial fibrillation with RVR (HCC) 02/09/2023   COPD (chronic obstructive pulmonary disease) (HCC) 02/09/2023   Moderate mixed hyperlipidemia not requiring statin therapy 08/17/2021   Dysuria 08/23/2020   Chronic cough 08/05/2017   Chronic vulvitis 02/13/2017   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   History of adenomatous polyp of colon 05/30/2016   Allergic rhinitis 11/09/2015   DD (diverticular disease) 11/09/2015   Essential hypertension 11/09/2015   Fibrosis lung (HCC) 11/09/2015   Arthritis, degenerative 11/09/2015   OP (osteoporosis) 11/09/2015    Orientation RESPIRATION BLADDER Height & Weight     Self, Time, Situation, Place  Normal Continent, External catheter Weight: 59 kg Height:  5\' 1"  (154.9 cm)  BEHAVIORAL SYMPTOMS/MOOD NEUROLOGICAL BOWEL NUTRITION STATUS      Continent Diet  AMBULATORY STATUS COMMUNICATION OF NEEDS Skin   Extensive Assist Verbally Normal, Surgical wounds                       Personal Care Assistance Level of Assistance  Bathing, Feeding, Dressing Bathing Assistance: Maximum assistance Feeding assistance: Limited  assistance Dressing Assistance: Maximum assistance     Functional Limitations Info  Sight, Hearing, Speech Sight Info: Adequate Hearing Info: Adequate Speech Info: Adequate    SPECIAL CARE FACTORS FREQUENCY  PT (By licensed PT), OT (By licensed OT)     PT Frequency: 5 times per week OT Frequency: 5 times per week            Contractures Contractures Info: Not present    Additional Factors Info  Allergies, Code Status Code Status Info: full code Allergies Info: lisinipril           Current Medications (02/11/2023):  This is the current hospital active medication list Current Facility-Administered Medications  Medication Dose Route Frequency Provider Last Rate Last Admin   0.9 %  sodium chloride infusion   Intravenous Continuous Signa Kell, MD 75 mL/hr at 02/10/23 1751 New Bag at 02/10/23 1751   acetaminophen (TYLENOL) tablet 1,000 mg  1,000 mg Oral Q8H Signa Kell, MD   1,000 mg at 02/11/23 1147   apixaban (ELIQUIS) tablet 2.5 mg  2.5 mg Oral BID Alford Highland, MD       bisacodyl (DULCOLAX) suppository 10 mg  10 mg Rectal Daily PRN Signa Kell, MD       docusate sodium (COLACE) capsule 100 mg  100 mg Oral BID Signa Kell, MD   100 mg at 02/11/23 0930   HYDROmorphone (DILAUDID) injection 0.2-0.4 mg  0.2-0.4 mg Intravenous Q4H PRN Signa Kell, MD   0.4 mg at 02/11/23 0305   menthol-cetylpyridinium (CEPACOL) lozenge 3 mg  1 lozenge Oral PRN Signa Kell,  MD       Or   phenol (CHLORASEPTIC) mouth spray 1 spray  1 spray Mouth/Throat PRN Signa Kell, MD       metoCLOPramide (REGLAN) tablet 5-10 mg  5-10 mg Oral Q8H PRN Signa Kell, MD       Or   metoCLOPramide (REGLAN) injection 5-10 mg  5-10 mg Intravenous Q8H PRN Signa Kell, MD       metoprolol succinate (TOPROL-XL) 24 hr tablet 12.5 mg  12.5 mg Oral QHS Wieting, Richard, MD       montelukast (SINGULAIR) tablet 10 mg  10 mg Oral QHS Verdene Lennert, MD   10 mg at 02/10/23 2120   ondansetron (ZOFRAN) tablet 4 mg   4 mg Oral Q6H PRN Signa Kell, MD       Or   ondansetron [facility]) injection 4 mg  4 mg Intravenous Q6H PRN Signa Kell, MD       oxyCODONE (Oxy IR/ROXICODONE) immediate release tablet 2.5-5 mg  2.5-5 mg Oral Q4H PRN Signa Kell, MD   5 mg at 02/11/23 0941   oxyCODONE (Oxy IR/ROXICODONE) immediate release tablet 5-10 mg  5-10 mg Oral Q4H PRN Signa Kell, MD       polyethylene glycol (MIRALAX / GLYCOLAX) packet 17 g  17 g Oral Daily PRN Verdene Lennert, MD   17 g at 02/11/23 0503   senna (SENOKOT) tablet 8.6 mg  1 tablet Oral BID Signa Kell, MD   8.6 mg at 02/11/23 0930   senna-docusate (Senokot-S) tablet 1 tablet  1 tablet Oral QHS PRN Signa Kell, MD       sodium phosphate (FLEET) 7-19 GM/118ML enema 1 enema  1 enema Rectal Once PRN Signa Kell, MD       traMADol Janean Sark) tablet 50 mg  50 mg Oral Q6H PRN Signa Kell, MD       zolpidem Remus Loffler) tablet 5 mg  5 mg Oral QHS PRN Signa Kell, MD         Discharge Medications: Please see discharge summary for a list of discharge medications.  Relevant Imaging Results:  Relevant Lab Results:   Additional Information SS# [id number]  Marlowe Sax, RN

## 2023-02-11 NOTE — Progress Notes (Signed)
Physical Therapy Treatment Patient Details Name: Karen Henson MRN: [id number] DOB: 1941-06-24 Today's Date: 02/11/2023   History of Present Illness Pt is an 82 y.o. female presenting to hospital s/p fall (palying T-ball with great grandchild) c/o R hip, shoulder, and elbow pain.  Imaging showing R displaced femoral neck fx and R anterior/inferior glenoid rim fx.  Pt admitted with closed R hip fx, R shoulder fx, a-fib with RVR, and ground level fall.  S/p R hip hemiarthroplasty and R shoulder exam under anesthesia (closed management of glenoid fx) 02/10/23.   PMH includes COPD, htn, idiopathic pulmonary fibrosis, osteoporosis, breast sx.    PT Comments    Pt resting in recliner upon PT arrival; agreeable to therapy.  5/10 R shoulder and R hip pain during session but pt reporting no pain at rest in bed end of session.  During session pt min to mod assist x1 with transfers using hemi-walker; CGA to min assist x2 to ambulate 10 feet with hemi-walker; and mod to max assist x1 to lay down in bed end of session.  Pt requiring vc's for precautions and WB'ing restrictions during session.  Pt appearing more confident during session and appearing very motivated to participate in therapy and get better.  Will continue to focus on strengthening, balance, and progressive functional mobility during hospitalization.    Recommendations for follow up therapy are one component of a multi-disciplinary discharge planning process, led by the attending physician.  Recommendations may be updated based on patient status, additional functional criteria and insurance authorization.  Follow Up Recommendations  Can patient physically be transported by private vehicle: No    Assistance Recommended at Discharge Frequent or constant Supervision/Assistance  Patient can return home with the following Two people to help with walking and/or transfers;A lot of help with bathing/dressing/bathroom;Assistance with  cooking/housework;Assist for transportation;Help with stairs or ramp for entrance   Equipment Recommendations  BSC/3in1;Other (comment) (Hemi-walker)    Recommendations for Other Services OT consult     Precautions / Restrictions Precautions Precautions: Shoulder;Posterior Hip;Fall Shoulder Interventions: Shoulder sling/immobilizer;At all times;For comfort Precaution Booklet Issued: Yes (comment) Precaution Comments: R KI at all times except when in CPM/PT Required Braces or Orthoses: Knee Immobilizer - Right Restrictions Weight Bearing Restrictions: Yes RUE Weight Bearing: Non weight bearing RLE Weight Bearing: Weight bearing as tolerated Other Position/Activity Restrictions: Per ortho surgical note 02/10/23: "The patient will be WBAT on the RLE. Sling to RUE for comfort. Recommend NWB on RUE, but patient may use RUE to perform ADLs".     Mobility  Bed Mobility Overal bed mobility: Needs Assistance Bed Mobility: Sit to Supine     Sit to supine: Mod assist, Max assist   General bed mobility comments: assist for trunk and B LE's; vc's for technique and posterior THP's    Transfers Overall transfer level: Needs assistance Equipment used: Hemi-walker Transfers: Sit to/from Stand, Bed to chair/wheelchair/BSC Sit to Stand: Min assist, Mod assist   Step pivot transfers: Min assist, Mod assist       General transfer comment: x2 trials standing from recliner and x1 trial standing from [facility] (vc's for UE/LE placement and posterior THP's; assist for balance initially in standing d/t posterior lean; stand step turn BSC to bed with hemi-walker use    Ambulation/Gait Ambulation/Gait assistance: Min guard, Min assist, +2 physical assistance Gait Distance (Feet): 10 Feet Assistive device: Hemi-walker   Gait velocity: decreased     General Gait Details: antalgic; decreased stance time R LE; vc's for hemi-walker  use and gait sequencing; assist for balance   Stairs              Wheelchair Mobility    Modified Rankin (Stroke Patients Only)       Balance Overall balance assessment: Needs assistance Sitting-balance support: No upper extremity supported, Feet supported Sitting balance-Leahy Scale: Good Sitting balance - Comments: steady sitting reaching within BOS with L UE   Standing balance support: Single extremity supported, Reliant on assistive device for balance Standing balance-Leahy Scale: Poor Standing balance comment: initial posterior lean in standing requiring assist for standing balance                            Cognition Arousal/Alertness: Awake/alert Behavior During Therapy: Anxious Overall Cognitive Status: Within Functional Limits for tasks assessed                                          Exercises Total Joint Exercises Long Arc Quad: AROM, Strengthening, Both, 10 reps, Seated    General Comments General comments (skin integrity, edema, etc.): R UE in sling      Pertinent Vitals/Pain Pain Assessment Pain Assessment: 0-10 Pain Score: 5  Pain Location: R shoulder and R hip Pain Descriptors / Indicators: Aching, Sore Pain Intervention(s): Limited activity within patient's tolerance, Monitored during session, Repositioned, Premedicated before session Vitals (HR and O2 on room air) stable and WFL throughout treatment session.    Home Living         Prior Function            PT Goals (current goals can now be found in the care plan section) Acute Rehab PT Goals Patient Stated Goal: to improve pain and mobility PT Goal Formulation: With patient Time For Goal Achievement: 02/25/23 Potential to Achieve Goals: Good Progress towards PT goals: Progressing toward goals    Frequency    BID      PT Plan Current plan remains appropriate    Co-evaluation        AM-PAC PT "6 Clicks" Mobility   Outcome Measure  Help needed turning from your back to your side while in a flat bed  without using bedrails?: A Lot Help needed moving from lying on your back to sitting on the side of a flat bed without using bedrails?: A Lot Help needed moving to and from a bed to a chair (including a wheelchair)?: A Lot Help needed standing up from a chair using your arms (e.g., wheelchair or bedside chair)?: A Lot Help needed to walk in hospital room?: Total Help needed climbing 3-5 steps with a railing? : Total 6 Click Score: 10    End of Session Equipment Utilized During Treatment: Gait belt;Other (comment) (R shoulder sling) Activity Tolerance: Patient tolerated treatment well Patient left: in bed;with call bell/phone within reach;with bed alarm set;with family/visitor present;with SCD's reapplied;Other (comment) (R LE KI and R UE sling in place; pillows placed between pt's knees for posterior THP's; B heels floating via pillow support; R UE supported on pillow) Nurse Communication: Mobility status;Precautions;Weight bearing status;Other (comment) (pt's pain status; pt able to urinate during session but was dark in color and pt reporting burning when urinating) PT Visit Diagnosis: Unsteadiness on feet (R26.81);Other abnormalities of gait and mobility (R26.89);Muscle weakness (generalized) (M62.81);History of falling (Z91.81);Pain Pain - Right/Left: Right Pain - part of body: Shoulder;Hip  Time: 7829-5621 PT Time Calculation (min) (ACUTE ONLY): 53 min  Charges:  $Gait Training: 8-22 mins $Therapeutic Exercise: 8-22 mins $Therapeutic Activity: 23-37 mins                     Hendricks Limes, PT 02/11/23, 3:37 PM

## 2023-02-11 NOTE — Progress Notes (Signed)
  Progress Note   Patient: Karen Henson ZOX:[id number] DOB: [birth year] DOA: 02/09/2023     2 DOS: the patient was seen and examined on 02/11/2023   Brief hospital course: 82 year old female with history of COPD, hypertension, idiopathic pulmonary fibrosis, osteoporosis presents to the ED after a fall while playing T-ball with her grandson.  Landed on the cement with her right hip and right shoulder.  Found to have a right femoral neck fracture.  In ER also found to be in rapid atrial fibrillation.  5/12.  Blood pressure low this morning.  Antihypertensive medications held.  Patient given a fluid bolus.  Patient still in atrial fibrillation.  Patient had right hip hemiarthroplasty and right shoulder exam under anesthesia, closed management of glenoid fracture. 5/13.  Orthopedic surgery okay with starting Eliquis this evening.    Assessment and Plan: * Closed right hip fracture (HCC) Right hip hemiarthroplasty on 5/12 by Dr. Allena Katz.  Pain control.  PT and OT evaluations.  Hypotension Improved.  Low-dose Toprol-XL at night.  Atrial fibrillation with RVR (HCC) Rate controlled on low-dose Toprol-XL at night.  Orthopedic surgery gave the okay to start Eliquis.  Will start this evening since patient received a Lovenox injection this morning.   COPD (chronic obstructive pulmonary disease) (HCC) - DuoNebs as needed for shortness of breath and wheezing - Continue home montelukast at bedtime  Fibrosis lung (HCC) History of IPF with no prior exacerbations.  See if we can get off oxygen.  Essential hypertension Low-dose Toprol-XL at night if blood pressure allows.        Subjective: Patient feels okay.  Some soreness with her hip.  Admitted after hip fracture.  Physical Exam: Vitals:   02/10/23 1957 02/11/23 0044 02/11/23 0502 02/11/23 0809  BP: 92/72 102/82 107/81 111/82  Pulse: 62 71 71 75  Resp: 16 16  18   Temp: 97.9 F (36.6 C) 97.9 F (36.6 C) 97.9 F (36.6 C) 97.8 F (36.6  C)  TempSrc:   Oral   SpO2: 97% 97% 98% 100%  Weight:      Height:       Physical Exam HENT:     Head: Normocephalic.     Mouth/Throat:     Pharynx: No oropharyngeal exudate.  Eyes:     General: Lids are normal.     Conjunctiva/sclera: Conjunctivae normal.  Cardiovascular:     Rate and Rhythm: Normal rate. Rhythm irregularly irregular.     Heart sounds: Normal heart sounds, S1 normal and S2 normal.  Pulmonary:     Breath sounds: No decreased breath sounds, wheezing, rhonchi or rales.  Abdominal:     Palpations: Abdomen is soft.     Tenderness: There is no abdominal tenderness.  Musculoskeletal:     Right lower leg: No swelling.     Left lower leg: No swelling.  Skin:    General: Skin is warm.     Findings: No rash.  Neurological:     Mental Status: She is alert and oriented to person, place, and time.     Data Reviewed: Sodium 130, creatinine 1.44, hemoglobin 12  Family Communication: Spoke with family at bedside  Disposition: Status is: Inpatient Remains inpatient appropriate because: Postoperative day 1 for right hemiarthroplasty  Planned Discharge Destination: Rehab    Time spent: 28 minutes  Author: Alford Highland, MD 02/11/2023 2:44 PM  For on call review [email or web address].

## 2023-02-11 NOTE — Evaluation (Signed)
Physical Therapy Evaluation Patient Details Name: Karen Henson MRN: [id number] DOB: 1941/01/03 Today's Date: 02/11/2023  History of Present Illness  Pt is an 82 y.o. female presenting to hospital s/p fall (palying T-ball with great grandchild) c/o R hip, shoulder, and elbow pain.  Imaging showing R displaced femoral neck fx and R anterior/inferior glenoid rim fx.  Pt admitted with closed R hip fx, R shoulder fx, a-fib with RVR, and ground level fall.  S/p R hip hemiarthroplasty and R shoulder exam under anesthesia (closed management of glenoid fx) 02/10/23.   PMH includes COPD, htn, idiopathic pulmonary fibrosis, osteoporosis, breast sx.  Clinical Impression  PT/OT co-evaluation performed.  Prior to recent fall, pt was independent with functional mobility; lives alone in 1 level home with ramp to enter.  5-6/10 R shoulder and R hip pain during session.  Reviewed precautions and WB'ing restrictions.  Currently pt is mod to max assist x2 semi-supine to sitting edge of bed; mod assist x2 to stand from bed up to hemi-walker; and mod assist x2 for stand step turn bed to recliner with hemi-walker use.  Pt appearing fearful during sessions activities requiring increased time and cueing.  Pt would currently benefit from skilled PT to address noted impairments and functional limitations (see below for any additional details).  Upon hospital discharge, pt would benefit from ongoing therapy.    Recommendations for follow up therapy are one component of a multi-disciplinary discharge planning process, led by the attending physician.  Recommendations may be updated based on patient status, additional functional criteria and insurance authorization.  Follow Up Recommendations Can patient physically be transported by private vehicle: No     Assistance Recommended at Discharge Frequent or constant Supervision/Assistance  Patient can return home with the following  Two people to help with walking and/or  transfers;A lot of help with bathing/dressing/bathroom;Assistance with cooking/housework;Assist for transportation;Help with stairs or ramp for entrance    Equipment Recommendations BSC/3in1;Other (comment) (Hemi-walker)  Recommendations for Other Services  OT consult    Functional Status Assessment Patient has had a recent decline in their functional status and demonstrates the ability to make significant improvements in function in a reasonable and predictable amount of time.     Precautions / Restrictions Precautions Precautions: Shoulder;Posterior Hip;Fall Shoulder Interventions: Shoulder sling/immobilizer;At all times;For comfort Precaution Booklet Issued: Yes (comment) Precaution Comments: R KI at all times except when in CPM/PT Required Braces or Orthoses: Knee Immobilizer - Right Restrictions Weight Bearing Restrictions: Yes RUE Weight Bearing: Non weight bearing RLE Weight Bearing: Weight bearing as tolerated Other Position/Activity Restrictions: Per ortho surgical note 02/10/23: "The patient will be WBAT on the RLE. Sling to RUE for comfort. Recommend NWB on RUE, but patient may use RUE to perform ADLs".      Mobility  Bed Mobility Overal bed mobility: Needs Assistance Bed Mobility: Supine to Sit     Supine to sit: Mod assist, Max assist, +2 for physical assistance, HOB elevated     General bed mobility comments: assist for trunk and B LE's; vc's for technique and posterior THP's    Transfers Overall transfer level: Needs assistance Equipment used: Hemi-walker Transfers: Sit to/from Stand, Bed to chair/wheelchair/BSC Sit to Stand: Mod assist, +2 physical assistance   Step pivot transfers: Mod assist, +2 physical assistance       General transfer comment: vc's for L hand and B LE placement for transfers; assist to initiate stand and control descent sitting; vc's for hemi-walker use; assist for balance initially in standing  d/t posterior lean (vc's to shift weight  forward required); stand step turn bed to recliner with hemi-walker use L UE with assist/cueing for weight-shifting and overall technique and hemi-walker use (max multi-modal cueing required)    Ambulation/Gait               General Gait Details: Deferred d/t safety concerns (pt appearing fearful and increased assist required for transfers)  Stairs            Wheelchair Mobility    Modified Rankin (Stroke Patients Only)       Balance Overall balance assessment: Needs assistance Sitting-balance support: Single extremity supported, Feet supported Sitting balance-Leahy Scale: Fair Sitting balance - Comments: steady static sitting once re-positioned on edge of bed   Standing balance support: Single extremity supported, Reliant on assistive device for balance Standing balance-Leahy Scale: Poor Standing balance comment: initial posterior lean in standing requiring assist for standing balance                             Pertinent Vitals/Pain Pain Assessment Pain Assessment: 0-10 Pain Score: 5  Pain Location: R shoulder and R hip Pain Descriptors / Indicators: Aching, Sore Pain Intervention(s): Limited activity within patient's tolerance, Monitored during session, Premedicated before session, Repositioned Vitals (HR and O2 on room air) stable and WFL throughout treatment session.    Home Living Family/patient expects to be discharged to:: Private residence Living Arrangements: Alone Available Help at Discharge: Family;Available PRN/intermittently Type of Home: House Home Access: Ramped entrance       Home Layout: One level Home Equipment: Cane - single point;Shower seat;Grab bars - tub/shower;Grab bars - toilet Additional Comments: Has family member's SW (who was taller than pt).    Prior Function Prior Level of Function : Independent/Modified Independent             Mobility Comments: No other recent falls reported.       Hand Dominance         Extremity/Trunk Assessment   Upper Extremity Assessment Upper Extremity Assessment: Defer to OT evaluation;RUE deficits/detail (R UE placed in shoulder immobilizer for sessions activities)    Lower Extremity Assessment Lower Extremity Assessment: RLE deficits/detail (L LE WFL) RLE Deficits / Details: 3-/5 hip flexion; at least 3/5 AROM knee flexion/extension and DF RLE: Unable to fully assess due to pain    Cervical / Trunk Assessment Cervical / Trunk Assessment: Normal  Communication   Communication: No difficulties  Cognition Arousal/Alertness: Awake/alert Behavior During Therapy: Anxious (Pt appearing fearful d/t pain and concerns of falling.) Overall Cognitive Status: Within Functional Limits for tasks assessed                                          General Comments  Nursing cleared pt for participation in physical therapy.  Pt agreeable to PT session.  Pt's 2 daughter's left beginning of session.    Exercises  Transfer training   Assessment/Plan    PT Assessment Patient needs continued PT services  PT Problem List Decreased strength;Decreased activity tolerance;Decreased balance;Decreased mobility;Decreased knowledge of use of DME;Decreased knowledge of precautions;Decreased skin integrity;Pain       PT Treatment Interventions DME instruction;Gait training;Functional mobility training;Therapeutic activities;Therapeutic exercise;Balance training;Patient/family education    PT Goals (Current goals can be found in the Care Plan section)  Acute Rehab PT Goals Patient  Stated Goal: to improve pain and mobility PT Goal Formulation: With patient Time For Goal Achievement: 02/25/23 Potential to Achieve Goals: Good    Frequency BID     Co-evaluation PT/OT/SLP Co-Evaluation/Treatment: Yes Reason for Co-Treatment: For patient/therapist safety;To address functional/ADL transfers;Complexity of the patient's impairments (multi-system involvement) PT  goals addressed during session: Mobility/safety with mobility;Balance;Proper use of DME OT goals addressed during session: ADL's and self-care       AM-PAC PT "6 Clicks" Mobility  Outcome Measure Help needed turning from your back to your side while in a flat bed without using bedrails?: A Lot Help needed moving from lying on your back to sitting on the side of a flat bed without using bedrails?: Total Help needed moving to and from a bed to a chair (including a wheelchair)?: Total Help needed standing up from a chair using your arms (e.g., wheelchair or bedside chair)?: Total Help needed to walk in hospital room?: Total Help needed climbing 3-5 steps with a railing? : Total 6 Click Score: 7    End of Session Equipment Utilized During Treatment: Gait belt;Other (comment) (R shoulder immobilizer) Activity Tolerance: Patient tolerated treatment well Patient left: in chair;with call bell/phone within reach;with chair alarm set;with SCD's reapplied;Other (comment) (B heels floating via pillow support; pillows placed between pt's knees for posterior THP's) Nurse Communication: Mobility status;Precautions;Weight bearing status PT Visit Diagnosis: Unsteadiness on feet (R26.81);Other abnormalities of gait and mobility (R26.89);Muscle weakness (generalized) (M62.81);History of falling (Z91.81);Pain Pain - Right/Left: Right Pain - part of body: Shoulder;Hip    Time: 1610-9604 PT Time Calculation (min) (ACUTE ONLY): 30 min   Charges:   PT Evaluation $PT Eval Low Complexity: 1 Low PT Treatments $Therapeutic Activity: 8-22 mins       Hendricks Limes, PT 02/11/23, 12:18 PM

## 2023-02-11 NOTE — Plan of Care (Signed)

## 2023-02-11 NOTE — Progress Notes (Signed)
Rounding Note    Patient Name: Karen Henson Date of Encounter: 02/11/2023  [facility] Cardiologist: Julien Nordmann, MD   Subjective   Seen on a.m. rounds.  Denies any chest pain or shortness of breath.  Family member at the bedside.  She is up in the recliner this morning.  Inpatient Medications    Scheduled Meds:  acetaminophen  1,000 mg Oral Q8H   docusate sodium  100 mg Oral BID   [START ON 02/12/2023] enoxaparin (LOVENOX) injection  30 mg Subcutaneous Q24H   ketorolac  7.5 mg Intravenous Q6H   metoprolol succinate  12.5 mg Oral QHS   montelukast  10 mg Oral QHS   senna  1 tablet Oral BID   Continuous Infusions:  sodium chloride 75 mL/hr at 02/10/23 1751   PRN Meds: bisacodyl, HYDROmorphone (DILAUDID) injection, menthol-cetylpyridinium **OR** phenol, metoCLOPramide **OR** metoCLOPramide (REGLAN) injection, ondansetron **OR** ondansetron (ZOFRAN) IV, oxyCODONE, oxyCODONE, polyethylene glycol, senna-docusate, sodium phosphate, traMADol, zolpidem   Vital Signs    Vitals:   02/10/23 1957 02/11/23 0044 02/11/23 0502 02/11/23 0809  BP: 92/72 102/82 107/81 111/82  Pulse: 62 71 71 75  Resp: 16 16  18   Temp: 97.9 F (36.6 C) 97.9 F (36.6 C) 97.9 F (36.6 C) 97.8 F (36.6 C)  TempSrc:   Oral   SpO2: 97% 97% 98% 100%  Weight:      Height:        Intake/Output Summary (Last 24 hours) at 02/11/2023 1125 Last data filed at 02/11/2023 0600 Gross per 24 hour  Intake 1576.25 ml  Output 950 ml  Net 626.25 ml      02/09/2023    1:45 PM 12/20/2022   10:20 AM 09/06/2022   10:03 AM  Last 3 Weights  Weight (lbs) 130 lb 1.1 oz 130 lb 6.4 oz 131 lb 8 oz  Weight (kg) 59 kg 59.149 kg 59.648 kg      Telemetry    Remains in rate controlled atrial fibrillation 70s to 80s- Personally Reviewed  ECG    No new tracings- Personally Reviewed  Physical Exam   GEN: No acute distress.   Neck: No JVD Cardiac: IR IR, no murmurs, rubs, or gallops.  Respiratory:  Clear to auscultation bilaterally. Respirations are unlabored on room air GI: Soft, nontender, non-distended  MS: No edema; No deformity. Neuro:  Nonfocal  Psych: Normal affect   Labs    High Sensitivity Troponin:  No results for input(s): "TROPONINIHS" in the last 720 hours.   Chemistry Recent Labs  Lab 02/09/23 1520 02/10/23 0524 02/11/23 0433  NA 133* 132* 130*  K 3.9 4.6 4.6  CL 100 99 99  CO2 26 24 22   GLUCOSE 121* 129* 120*  BUN 18 23 38*  CREATININE 0.78 1.21* 1.44*  CALCIUM 9.0 8.7* 8.1*  MG  --  2.1  --   GFRNONAA >60 45* 37*  ANIONGAP 7 9 9     Lipids No results for input(s): "CHOL", "TRIG", "HDL", "LABVLDL", "LDLCALC", "CHOLHDL" in the last 168 hours.  Hematology Recent Labs  Lab 02/09/23 1520 02/10/23 0524 02/11/23 0433  WBC 9.2 11.3* 10.0  RBC 4.34 4.34 3.81*  HGB 13.6 13.6 12.0  HCT 41.6 41.9 37.1  MCV 95.9 96.5 97.4  MCH 31.3 31.3 31.5  MCHC 32.7 32.5 32.3  RDW 14.1 14.5 14.5  PLT 262 281 197   Thyroid No results for input(s): "TSH", "FREET4" in the last 168 hours.  BNPNo results for input(s): "BNP", "PROBNP" in  the last 168 hours.  DDimer No results for input(s): "DDIMER" in the last 168 hours.   Radiology    ECHOCARDIOGRAM COMPLETE  Result Date: 02/10/2023    ECHOCARDIOGRAM REPORT   Patient Name:   Karen Henson Date of Exam: 02/10/2023 Medical Rec #:  [id number]       Height:       61.0 in Accession #:    [id number]      Weight:       130.1 lb Date of Birth:  [birth year]       BSA:          1.573 m Patient Age:    82 years        BP:           117/83 mmHg Patient Gender: F               HR:           96 bpm. Exam Location:  [facility] Procedure: 2D Echo, Color Doppler and Cardiac Doppler Indications:     Atrial fibrlillation I48.91  History:         Patient has no prior history of Echocardiogram examinations.                  COPD; Risk Factors:Hypertension. Lung fibrosis.  Sonographer:     Cristela Blue Referring Phys:  [id number] Verdene Lennert Diagnosing  Phys: Julien Nordmann MD  Sonographer Comments: Suboptimal apical window. IMPRESSIONS  1. Left ventricular ejection fraction, by estimation, is 55 to 60%. The left ventricle has normal function. The left ventricle has no regional wall motion abnormalities. Left ventricular diastolic parameters are indeterminate.  2. Right ventricular systolic function is normal. The right ventricular size is moderately enlarged. There is normal pulmonary artery systolic pressure. The estimated right ventricular systolic pressure is 35.0 mmHg.  3. Right atrial size was moderately dilated.  4. The mitral valve is normal in structure. Mild mitral valve regurgitation. No evidence of mitral stenosis.  5. Tricuspid valve regurgitation is moderate to severe.  6. The aortic valve is tricuspid. Aortic valve regurgitation is not visualized. No aortic stenosis is present.  7. The inferior vena cava is normal in size with greater than 50% respiratory variability, suggesting right atrial pressure of 3 mmHg. FINDINGS  Left Ventricle: Left ventricular ejection fraction, by estimation, is 55 to 60%. The left ventricle has normal function. The left ventricle has no regional wall motion abnormalities. The left ventricular internal cavity size was normal in size. There is  no left ventricular hypertrophy. Left ventricular diastolic parameters are indeterminate. Right Ventricle: The right ventricular size is moderately enlarged. No increase in right ventricular wall thickness. Right ventricular systolic function is normal. There is normal pulmonary artery systolic pressure. The tricuspid regurgitant velocity is 2.74 m/s, and with an assumed right atrial pressure of 5 mmHg, the estimated right ventricular systolic pressure is 35.0 mmHg. Left Atrium: Left atrial size was normal in size. Right Atrium: Right atrial size was moderately dilated. Pericardium: There is no evidence of pericardial effusion. Mitral Valve: The mitral valve is normal in structure.  Mild mitral valve regurgitation. No evidence of mitral valve stenosis. Tricuspid Valve: The tricuspid valve is normal in structure. Tricuspid valve regurgitation is moderate to severe. No evidence of tricuspid stenosis. Aortic Valve: The aortic valve is tricuspid. Aortic valve regurgitation is not visualized. No aortic stenosis is present. Aortic valve mean gradient measures 1.0 mmHg. Aortic valve peak gradient measures 2.3 mmHg. Aortic valve  area, by VTI measures 3.02 cm. Pulmonic Valve: The pulmonic valve was normal in structure. Pulmonic valve regurgitation is not visualized. No evidence of pulmonic stenosis. Aorta: The aortic root is normal in size and structure. Venous: The inferior vena cava is normal in size with greater than 50% respiratory variability, suggesting right atrial pressure of 3 mmHg. IAS/Shunts: No atrial level shunt detected by color flow Doppler.  LEFT VENTRICLE PLAX 2D LVIDd:         3.90 cm LVIDs:         2.80 cm LV PW:         0.80 cm LV IVS:        1.30 cm LVOT diam:     2.00 cm LV SV:         31 LV SV Index:   20 LVOT Area:     3.14 cm  RIGHT VENTRICLE RV Basal diam:  4.50 cm RV Mid diam:    4.10 cm LEFT ATRIUM             Index        RIGHT ATRIUM           Index LA diam:        2.90 cm 1.84 cm/m   RA Area:     22.20 cm LA Vol (A2C):   38.4 ml 24.41 ml/m  RA Volume:   69.70 ml  44.31 ml/m LA Vol (A4C):   36.9 ml 23.46 ml/m LA Biplane Vol: 39.9 ml 25.36 ml/m  AORTIC VALVE AV Area (Vmax):    2.70 cm AV Area (Vmean):   2.42 cm AV Area (VTI):     3.02 cm AV Vmax:           76.30 cm/s AV Vmean:          53.700 cm/s AV VTI:            0.103 m AV Peak Grad:      2.3 mmHg AV Mean Grad:      1.0 mmHg LVOT Vmax:         65.50 cm/s LVOT Vmean:        41.400 cm/s LVOT VTI:          0.099 m LVOT/AV VTI ratio: 0.96  AORTA Ao Root diam: 2.90 cm MITRAL VALVE               TRICUSPID VALVE MV Area (PHT): 4.12 cm    TR Peak grad:   30.0 mmHg MV Decel Time: 184 msec    TR Vmax:        274.00  cm/s MV E velocity: 56.10 cm/s                            SHUNTS                            Systemic VTI:  0.10 m                            Systemic Diam: 2.00 cm Julien Nordmann MD Electronically signed by Julien Nordmann MD Signature Date/Time: 02/10/2023/7:45:31 PM    Final    DG Pelvis Portable  Result Date: 02/10/2023 CLINICAL DATA:  [id number] S/P hip hemiarthroplasty [id number] EXAM: PORTABLE PELVIS 1-2 VIEWS COMPARISON:  [id number] S/P hip hemiarthroplasty [id number] FINDINGS: Postsurgical changes of right hip arthroplasty.  Normal alignment. No evidence of loosening or fracture. Expected soft tissue changes. Moderate left hip osteoarthritis. IMPRESSION: Postsurgical changes of right hip arthroplasty. Normal alignment. No evidence of immediate hardware complication. Electronically Signed   By: Caprice Renshaw M.D.   On: 02/10/2023 15:32   DG HIP UNILAT WITH PELVIS 1V RIGHT  Result Date: 02/10/2023 CLINICAL DATA:  Elective surgery. EXAM: DG HIP (WITH OR WITHOUT PELVIS) 1V RIGHT COMPARISON:  Preoperative imaging FINDINGS: Single intraoperative decubitus view of the pelvis/right hip obtained in the operating room during right hip surgery. IMPRESSION: Intraoperative spot view during right hip surgery. Electronically Signed   By: Narda Rutherford M.D.   On: 02/10/2023 14:21   CT Shoulder Right Wo Contrast  Result Date: 02/09/2023 CLINICAL DATA:  Fall, pain EXAM: CT OF THE UPPER RIGHT EXTREMITY WITHOUT CONTRAST TECHNIQUE: Multidetector CT imaging of the upper right extremity was performed according to the standard protocol. RADIATION DOSE REDUCTION: This exam was performed according to the departmental dose-optimization program which includes automated exposure control, adjustment of the mA and/or kV according to patient size and/or use of iterative reconstruction technique. COMPARISON:  Right shoulder x-ray earlier same day FINDINGS: Bones/Joint/Cartilage There is an acute comminuted fracture of the anterior inferior  glenoid. Fracture fragments are distracted 6 mm. There is no dislocation. No focal osseous lesion identified. Ligaments Suboptimally assessed by CT. Muscles and Tendons No intramuscular hematoma. Soft tissues There is deep soft tissue edema surrounding the humeral head. Small joint effusion present. IMPRESSION: Acute comminuted fracture of the anterior inferior glenoid. Electronically Signed   By: Darliss Cheney M.D.   On: 02/09/2023 17:17   CT Hip Right Wo Contrast  Result Date: 02/09/2023 CLINICAL DATA:  Fall EXAM: CT OF THE RIGHT HIP WITHOUT CONTRAST TECHNIQUE: Multidetector CT imaging of the right hip was performed according to the standard protocol. Multiplanar CT image reconstructions were also generated. RADIATION DOSE REDUCTION: This exam was performed according to the departmental dose-optimization program which includes automated exposure control, adjustment of the mA and/or kV according to patient size and/or use of iterative reconstruction technique. COMPARISON:  Right hip x-ray same day FINDINGS: Bones/Joint/Cartilage The bones are osteopenic. There is an acute fracture of the right femoral neck. There is 1 cm of superior displacement of the distal fracture fragment with apex superolateral angulation. There is no dislocation. No additional fractures are identified. Ligaments Suboptimally assessed by CT. Muscles and Tendons No intramuscular hematoma. Soft tissues Lipohemarthrosis is noted in the right hip. There is mild deep soft tissue edema surrounding the right hip. IMPRESSION: 1. Acute angulated and displaced fracture of the right femoral neck. 2. Lipohemarthrosis in the right hip. Electronically Signed   By: Darliss Cheney M.D.   On: 02/09/2023 17:15   DG Hip Unilat W or Wo Pelvis 2-3 Views Right  Result Date: 02/09/2023 CLINICAL DATA:  Fall. EXAM: DG HIP (WITH OR WITHOUT PELVIS) 2-3V RIGHT COMPARISON:  None Available. FINDINGS: Bones are diffusely demineralized. SI joints and symphysis pubis  unremarkable. Right femoral neck fracture with varus angulation. Fracture line is not well demonstrated but is probably transcervical to basicervical. There is some lucency in the humeral head raising the question of pathologic fracture. IMPRESSION: 1. Right femoral neck fracture with varus angulation. 2. Lucency in the femoral head raises the question of pathologic fracture. Electronically Signed   By: Kennith Center M.D.   On: 02/09/2023 15:27   DG Shoulder Right  Result Date: 02/09/2023 CLINICAL DATA:  Fall EXAM: RIGHT SHOULDER - 2+ VIEW  COMPARISON:  None Available. FINDINGS: The bones are osteopenic. There is some linear lucencies through the inferior glenoid which may be secondary to degenerative change; however, acute nondisplaced fractures not excluded. Joint spaces are well maintained and alignment is anatomic. There are minimal degenerative changes of the acromioclavicular joint. The soft tissues are within normal limits. IMPRESSION: 1. Linear lucencies through the inferior glenoid which may be secondary to degenerative change; however, acute nondisplaced fractures not excluded. 2. Osteopenia. Electronically Signed   By: Darliss Cheney M.D.   On: 02/09/2023 15:26   DG Elbow Complete Right  Result Date: 02/09/2023 CLINICAL DATA:  Fall.  Hematoma noted to right elbow. EXAM: RIGHT ELBOW - COMPLETE 3+ VIEW COMPARISON:  None Available. FINDINGS: There is no evidence of fracture, dislocation, or joint effusion. No focal bone abnormality. Soft tissue swelling is noted overlying the posterior aspect of the elbow/proximal forearm. IMPRESSION: No fracture or dislocation of the right elbow. Electronically Signed   By: Sherron Ales M.D.   On: 02/09/2023 15:25   DG Chest 1 View  Result Date: 02/09/2023 CLINICAL DATA:  Injury EXAM: CHEST  1 VIEW COMPARISON:  Chest x-ray 10/23/2021 FINDINGS: The heart is enlarged, unchanged. The lungs are clear. There is no pleural effusion or pneumothorax. No acute fractures  are identified. IMPRESSION: Cardiomegaly without acute cardiopulmonary process. Electronically Signed   By: Darliss Cheney M.D.   On: 02/09/2023 15:24    Cardiac Studies  TTE 02/10/23 1. Left ventricular ejection fraction, by estimation, is 55 to 60%. The  left ventricle has normal function. The left ventricle has no regional  wall motion abnormalities. Left ventricular diastolic parameters are  indeterminate.   2. Right ventricular systolic function is normal. The right ventricular  size is moderately enlarged. There is normal pulmonary artery systolic  pressure. The estimated right ventricular systolic pressure is 35.0 mmHg.   3. Right atrial size was moderately dilated.   4. The mitral valve is normal in structure. Mild mitral valve  regurgitation. No evidence of mitral stenosis.   5. Tricuspid valve regurgitation is moderate to severe.   6. The aortic valve is tricuspid. Aortic valve regurgitation is not  visualized. No aortic stenosis is present.   7. The inferior vena cava is normal in size with greater than 50%  respiratory variability, suggesting right atrial pressure of 3 mmHg.   Patient Profile     82 y.o. female with a past medical history of hypertension, COPD, idiopathic pulmonary fibrosis, and osteoporosis, who has been seen and evaluated for atrial fibrillation in the setting of a fall with a right femoral neck fracture and fracture of the right anterior inferior glenoid.  Assessment & Plan    Atrial fibrillation in the setting of a fall with a right femoral neck fracture and right anterior inferior glenoid fracture -On arrival was noted to be in atrial fibrillation with RVR -Remained asymptomatic -Duration of atrial fibrillation is unknown -Rate controlled in the 70s and 80s -She is started on metoprolol succinate 12.5 mg daily at bedtime, first dose to be this evening -Plan is to start OAC (CHA2DS2-VASc score of at least 4) positive postop/consideration for  cardioversion if appropriate following with at least 3 to 4 weeks of uninterrupted OAC. -OAC to be started when deemed safe from surgical standpoint -Continue on telemetry monitoring  Hypertension -Blood pressure 111/82 -Blood pressures have previously been soft in relation to requiring increased amounts of pain medication -Vital signs per unit protocol  COPD/idiopathic pulmonary fibrosis -Respirations are  unlabored on room air -Management per IM  Right femoral neck fracture and right anterior inferior glenoid fracture -Postop day 1 -Mild pain -Patient plans to go to rehab versus home after hospital stay -Weightbearing as tolerated to right leg -Management per orthopedics and IM     For questions or updates, please contact [facility] Please consult [email or web address] for contact info under        Signed, Kaycen Whitworth, NP  02/11/2023, 11:25 AM

## 2023-02-12 ENCOUNTER — Other Ambulatory Visit (HOSPITAL_COMMUNITY): Payer: Self-pay

## 2023-02-12 ENCOUNTER — Telehealth (HOSPITAL_COMMUNITY): Payer: Self-pay | Admitting: Pharmacy Technician

## 2023-02-12 DIAGNOSIS — I4891 Unspecified atrial fibrillation: Secondary | ICD-10-CM | POA: Diagnosis not present

## 2023-02-12 DIAGNOSIS — S72001S Fracture of unspecified part of neck of right femur, sequela: Secondary | ICD-10-CM | POA: Diagnosis not present

## 2023-02-12 DIAGNOSIS — I959 Hypotension, unspecified: Secondary | ICD-10-CM | POA: Diagnosis not present

## 2023-02-12 DIAGNOSIS — K5900 Constipation, unspecified: Secondary | ICD-10-CM | POA: Insufficient documentation

## 2023-02-12 DIAGNOSIS — K5909 Other constipation: Secondary | ICD-10-CM

## 2023-02-12 DIAGNOSIS — J449 Chronic obstructive pulmonary disease, unspecified: Secondary | ICD-10-CM | POA: Diagnosis not present

## 2023-02-12 LAB — BASIC METABOLIC PANEL
Anion gap: 6 (ref 5–15)
BUN: 42 mg/dL — ABNORMAL HIGH (ref 8–23)
CO2: 24 mmol/L (ref 22–32)
Calcium: 8.5 mg/dL — ABNORMAL LOW (ref 8.9–10.3)
Chloride: 99 mmol/L (ref 98–111)
Creatinine, Ser: 0.83 mg/dL (ref 0.44–1.00)
GFR, Estimated: >60 mL/min (ref >60)
Glucose, Bld: 103 mg/dL — ABNORMAL HIGH (ref 70–99)
Potassium: 4.3 mmol/L (ref 3.5–5.1)
Sodium: 129 mmol/L — ABNORMAL LOW (ref 135–145)

## 2023-02-12 LAB — CBC
HCT: 35.9 % — ABNORMAL LOW (ref 36.0–46.0)
Hemoglobin: 11.9 g/dL — ABNORMAL LOW (ref 12.0–15.0)
MCH: 31.6 pg (ref 26.0–34.0)
MCHC: 33.1 g/dL (ref 30.0–36.0)
MCV: 95.2 fL (ref 80.0–100.0)
Platelets: 210 10*3/uL (ref 150–400)
RBC: 3.77 MIL/uL — ABNORMAL LOW (ref 3.87–5.11)
RDW: 14.4 % (ref 11.5–15.5)
WBC: 9.2 10*3/uL (ref 4.0–10.5)
nRBC: 0 % (ref 0.0–0.2)

## 2023-02-12 LAB — SURGICAL PATHOLOGY

## 2023-02-12 MED ORDER — METOPROLOL SUCCINATE ER 25 MG PO TB24
25.0000 mg | ORAL_TABLET | Freq: Every day | ORAL | Status: DC
  Administered 2023-02-12: 25 mg via ORAL
  Filled 2023-02-12: qty 1

## 2023-02-12 MED ORDER — POLYETHYLENE GLYCOL 3350 17 G PO PACK
17.0000 g | PACK | Freq: Two times a day (BID) | ORAL | Status: DC
  Administered 2023-02-12 – 2023-02-13 (×2): 17 g via ORAL
  Filled 2023-02-12 (×2): qty 1

## 2023-02-12 NOTE — TOC Benefit Eligibility Note (Signed)
Patient Product/process development scientist completed.    The patient is currently admitted and upon discharge could be taking Eliquis 5 mg.  The current 30 day co-pay is $523.43 due to a $545.00 deductible.   The patient is insured through [employer]   This test claim was processed through [facility]- copay amounts may vary at other pharmacies due to pharmacy/plan contracts, or as the patient moves through the different stages of their insurance plan.  Roland Earl, CPHT Pharmacy Patient Advocate Specialist [facility] Patient Advocate Team Direct Number: [phone]  Fax: [phone]

## 2023-02-12 NOTE — Care Management Important Message (Signed)
Important Message  Patient Details  Name: Karen Henson MRN: [id number] Date of Birth: 1941/03/23   Medicare Important Message Given:  Yes     Olegario Messier A Shenee Wignall 02/12/2023, 2:30 PM

## 2023-02-12 NOTE — Progress Notes (Signed)
Physical Therapy Treatment Patient Details Name: Karen Henson MRN: [id number] DOB: 04/03/1941 Today's Date: 02/12/2023   History of Present Illness Pt is an 82 y.o. female presenting to hospital s/p fall (palying T-ball with great grandchild) c/o R hip, shoulder, and elbow pain.  Imaging showing R displaced femoral neck fx and R anterior/inferior glenoid rim fx.  Pt admitted with closed R hip fx, R shoulder fx, a-fib with RVR, and ground level fall.  S/p R hip hemiarthroplasty and R shoulder exam under anesthesia (closed management of glenoid fx) 02/10/23.   PMH includes COPD, htn, idiopathic pulmonary fibrosis, osteoporosis, breast sx.    PT Comments    Pt resting in recliner upon PT arrival; agreeable to therapy; pt's 2 daughters present but stepped out for therapy session.  5/10 R shoulder and R hip pain during session (nurse notified).  During session pt min assist with transfers (improved initial standing balance noted) and min assist to ambulate 40 feet and then (after toileting in bathroom) 20 feet with hemi-walker (limited distance ambulating d/t fatigue and SOB; O2 sats 92% or greater on room air during sessions activities).  Pt requires cueing for R UE/LE precautions/restrictions during sessions activities.  Will continue to focus on strengthening, balance, and progressive functional mobility during hospitalization.   Recommendations for follow up therapy are one component of a multi-disciplinary discharge planning process, led by the attending physician.  Recommendations may be updated based on patient status, additional functional criteria and insurance authorization.  Follow Up Recommendations  Can patient physically be transported by private vehicle: No    Assistance Recommended at Discharge Frequent or constant Supervision/Assistance  Patient can return home with the following A little help with walking and/or transfers;A lot of help with bathing/dressing/bathroom;Assistance with  cooking/housework;Assist for transportation;Help with stairs or ramp for entrance   Equipment Recommendations  BSC/3in1;Other (comment) (hemi-walker)    Recommendations for Other Services OT consult     Precautions / Restrictions Precautions Precautions: Shoulder;Posterior Hip;Fall Shoulder Interventions: Shoulder sling/immobilizer;At all times;For comfort Precaution Booklet Issued: Yes (comment) Precaution Comments: R KI at all times except when in CPM/PT Required Braces or Orthoses: Knee Immobilizer - Right Restrictions Weight Bearing Restrictions: Yes RUE Weight Bearing: Non weight bearing RLE Weight Bearing: Weight bearing as tolerated Other Position/Activity Restrictions: Per ortho surgical note 02/10/23: "The patient will be WBAT on the RLE. Sling to RUE for comfort. Recommend NWB on RUE, but patient may use RUE to perform ADLs".     Mobility  Bed Mobility         General bed mobility comments: Deferred (pt in recliner beginning/end of session).    Transfers Overall transfer level: Needs assistance Equipment used: Hemi-walker Transfers: Sit to/from Stand Sit to Stand: Min assist   Step pivot transfers: Min assist (to [facility] over toilet)       General transfer comment: x1 trial standing from recliner and x1 trial standing from [facility] over toilet; vc's for positioning, posterior hip precautions, and overall technique    Ambulation/Gait Ambulation/Gait assistance: Min assist Gait Distance (Feet):  (40 feet; 20 feet) Assistive device: Hemi-walker Gait Pattern/deviations: Step-to pattern Gait velocity: decreased     General Gait Details: antalgic; decreased stance time R LE; vc's for hemi-walker use and gait sequencing; vc's to keep hemi-walker closer for more upright posture and to improve balance; assist for balance   Stairs             Wheelchair Mobility    Modified Rankin (Stroke Patients Only)  Balance Overall balance assessment: Needs  assistance Sitting-balance support: No upper extremity supported, Feet supported Sitting balance-Leahy Scale: Good Sitting balance - Comments: steady sitting reaching within BOS with L UE   Standing balance support: No upper extremity supported Standing balance-Leahy Scale: Fair Standing balance comment: CGA for safety standing washing hands at sink                            Cognition Arousal/Alertness: Awake/alert Behavior During Therapy: Anxious Overall Cognitive Status: Within Functional Limits for tasks assessed                                          Exercises      General Comments General comments (skin integrity, edema, etc.): R UE in sling      Pertinent Vitals/Pain Pain Assessment Pain Assessment: 0-10 Pain Score: 5  Pain Location: R shoulder and R hip Pain Descriptors / Indicators: Aching, Sore Pain Intervention(s): Limited activity within patient's tolerance, Monitored during session, Premedicated before session, Repositioned, Other (comment) (RN notified of pt's pain) Vitals (HR and O2 on room air) stable and WFL throughout treatment session.    Home Living                          Prior Function            PT Goals (current goals can now be found in the care plan section) Acute Rehab PT Goals Patient Stated Goal: to improve pain and mobility PT Goal Formulation: With patient Time For Goal Achievement: 02/25/23 Potential to Achieve Goals: Good Progress towards PT goals: Progressing toward goals    Frequency    BID      PT Plan Current plan remains appropriate    Co-evaluation              AM-PAC PT "6 Clicks" Mobility   Outcome Measure  Help needed turning from your back to your side while in a flat bed without using bedrails?: A Little Help needed moving from lying on your back to sitting on the side of a flat bed without using bedrails?: A Lot Help needed moving to and from a bed to a chair  (including a wheelchair)?: A Little Help needed standing up from a chair using your arms (e.g., wheelchair or bedside chair)?: A Little Help needed to walk in hospital room?: A Little Help needed climbing 3-5 steps with a railing? : A Lot 6 Click Score: 16    End of Session Equipment Utilized During Treatment: Gait belt;Other (comment) (R shoulder sling) Activity Tolerance: Patient tolerated treatment well Patient left: in chair;with call bell/phone within reach;with chair alarm set;with SCD's reapplied;Other (comment) (R KI in place; pillow placed between pt's B LE's for posterior THP's) Nurse Communication: Mobility status;Precautions;Weight bearing status;Other (comment) (pt's pain status) PT Visit Diagnosis: Unsteadiness on feet (R26.81);Other abnormalities of gait and mobility (R26.89);Muscle weakness (generalized) (M62.81);History of falling (Z91.81);Pain Pain - Right/Left: Right Pain - part of body: Shoulder;Hip     Time: 1610-9604 PT Time Calculation (min) (ACUTE ONLY): 53 min  Charges:  $Gait Training: 23-37 mins $Therapeutic Activity: 23-37 mins                    Hendricks Limes, PT 02/12/23, 3:37 PM

## 2023-02-12 NOTE — Progress Notes (Signed)
Physical Therapy Treatment Patient Details Name: Karen Henson MRN: [id number] DOB: Nov 15, 1940 Today's Date: 02/12/2023   History of Present Illness Pt is an 82 y.o. female presenting to hospital s/p fall (palying T-ball with great grandchild) c/o R hip, shoulder, and elbow pain.  Imaging showing R displaced femoral neck fx and R anterior/inferior glenoid rim fx.  Pt admitted with closed R hip fx, R shoulder fx, a-fib with RVR, and ground level fall.  S/p R hip hemiarthroplasty and R shoulder exam under anesthesia (closed management of glenoid fx) 02/10/23.   PMH includes COPD, htn, idiopathic pulmonary fibrosis, osteoporosis, breast sx.    PT Comments    Pt resting in bed upon PT arrival; agreeable to therapy but requesting to toilet first (pt assisted with using BSC beginning of session).  5/10 R shoulder and R hip pain throughout session (pt pre-medicated with pain meds).  During session pt mod to max assist semi-supine to sitting edge of bed; min to mod assist with transfers; and min assist to ambulate 60 feet with hemi-walker use (assist required for balance; vc's required for overall technique and sequencing).  Will continue to focus on strengthening, balance, and progressive functional mobility during hospitalization.    Recommendations for follow up therapy are one component of a multi-disciplinary discharge planning process, led by the attending physician.  Recommendations may be updated based on patient status, additional functional criteria and insurance authorization.  Follow Up Recommendations  Can patient physically be transported by private vehicle: No    Assistance Recommended at Discharge Frequent or constant Supervision/Assistance  Patient can return home with the following A lot of help with walking and/or transfers;A lot of help with bathing/dressing/bathroom;Assistance with cooking/housework;Assist for transportation;Help with stairs or ramp for entrance   Equipment  Recommendations  BSC/3in1;Other (comment) (hemi-walker)    Recommendations for Other Services OT consult     Precautions / Restrictions Precautions Precautions: Shoulder;Posterior Hip;Fall Shoulder Interventions: Shoulder sling/immobilizer;At all times;For comfort Precaution Booklet Issued: Yes (comment) Precaution Comments: R KI at all times except when in CPM/PT Required Braces or Orthoses: Knee Immobilizer - Right Restrictions Weight Bearing Restrictions: Yes RUE Weight Bearing: Non weight bearing RLE Weight Bearing: Weight bearing as tolerated Other Position/Activity Restrictions: Per ortho surgical note 02/10/23: "The patient will be WBAT on the RLE. Sling to RUE for comfort. Recommend NWB on RUE, but patient may use RUE to perform ADLs".     Mobility  Bed Mobility Overal bed mobility: Needs Assistance Bed Mobility: Supine to Sit     Supine to sit: Mod assist, Max assist, HOB elevated     General bed mobility comments: assist for trunk and R LE; vc's for technique and posterior THP's    Transfers Overall transfer level: Needs assistance Equipment used: Hemi-walker Transfers: Sit to/from Stand, Bed to chair/wheelchair/BSC Sit to Stand: Min assist, Mod assist   Step pivot transfers: Min assist, Mod assist (stand step turn bed to [facility] to recliner)       General transfer comment: x1 trial each standing from bed, BSC, and recliner; vc's for overall technique and posterior hip precautions    Ambulation/Gait Ambulation/Gait assistance: Min assist, +2 safety/equipment (chair follow) Gait Distance (Feet): 60 Feet Assistive device: Hemi-walker Gait Pattern/deviations: Step-to pattern Gait velocity: decreased     General Gait Details: antalgic; decreased stance time R LE; vc's for hemi-walker use and gait sequencing; assist for balance   Optometrist  Modified Rankin (Stroke Patients Only)       Balance Overall balance  assessment: Needs assistance Sitting-balance support: No upper extremity supported, Feet supported Sitting balance-Leahy Scale: Good Sitting balance - Comments: steady sitting reaching within BOS with L UE   Standing balance support: Single extremity supported, Reliant on assistive device for balance Standing balance-Leahy Scale: Poor Standing balance comment: initial posterior lean in standing requiring assist for standing balance                            Cognition Arousal/Alertness: Awake/alert Behavior During Therapy: Anxious Overall Cognitive Status: Within Functional Limits for tasks assessed                                          Exercises      General Comments General comments (skin integrity, edema, etc.): R UE in sling.  Nursing cleared pt for participation in physical therapy.  Pt agreeable to PT session.  Pt's family present beginning/end of session but left during sessions activities.      Pertinent Vitals/Pain Pain Assessment Pain Assessment: 0-10 Pain Score: 5  Pain Location: R shoulder and R hip Pain Descriptors / Indicators: Aching, Sore Pain Intervention(s): Limited activity within patient's tolerance, Monitored during session, Premedicated before session, Repositioned Vitals (HR and O2 on room air) stable and WFL throughout treatment session.    Home Living                          Prior Function            PT Goals (current goals can now be found in the care plan section) Acute Rehab PT Goals Patient Stated Goal: to improve pain and mobility PT Goal Formulation: With patient Time For Goal Achievement: 02/25/23 Potential to Achieve Goals: Good Progress towards PT goals: Progressing toward goals    Frequency    BID      PT Plan Current plan remains appropriate    Co-evaluation              AM-PAC PT "6 Clicks" Mobility   Outcome Measure  Help needed turning from your back to your side while  in a flat bed without using bedrails?: A Little Help needed moving from lying on your back to sitting on the side of a flat bed without using bedrails?: A Lot Help needed moving to and from a bed to a chair (including a wheelchair)?: A Little Help needed standing up from a chair using your arms (e.g., wheelchair or bedside chair)?: A Lot Help needed to walk in hospital room?: A Little Help needed climbing 3-5 steps with a railing? : A Lot 6 Click Score: 15    End of Session Equipment Utilized During Treatment: Gait belt;Other (comment) (R shoulder sling) Activity Tolerance: Patient tolerated treatment well Patient left: in chair;with call bell/phone within reach;with chair alarm set;with family/visitor present;with SCD's reapplied;Other (comment) (R KI in place; pillow between pt's knees for posterior THP's) Nurse Communication: Mobility status;Precautions;Weight bearing status PT Visit Diagnosis: Unsteadiness on feet (R26.81);Other abnormalities of gait and mobility (R26.89);Muscle weakness (generalized) (M62.81);History of falling (Z91.81);Pain Pain - Right/Left: Right Pain - part of body: Shoulder;Hip     Time: 4098-1191 PT Time Calculation (min) (ACUTE ONLY): 38 min  Charges:  $Gait Training: 8-22 mins $Therapeutic Activity:  23-37 mins                     Hendricks Limes, PT 02/12/23, 12:17 PM

## 2023-02-12 NOTE — Progress Notes (Signed)
Rounding Note    Patient Name: Karen Henson Date of Encounter: 02/12/2023  [facility] Cardiologist: Julien Nordmann, MD   Subjective   Patient seen on rounds.  Denies any chest pain.  Occasionally has some palpitations and shortness of breath.  Started on apixaban last evening without incident.  Continues to work with physical therapy and awaiting discharge to rehab.  Inpatient Medications    Scheduled Meds:  acetaminophen  1,000 mg Oral Q8H   apixaban  2.5 mg Oral BID   docusate sodium  100 mg Oral BID   metoprolol succinate  12.5 mg Oral QHS   montelukast  10 mg Oral QHS   senna  1 tablet Oral BID   Continuous Infusions:  PRN Meds: bisacodyl, HYDROmorphone (DILAUDID) injection, menthol-cetylpyridinium **OR** phenol, metoCLOPramide **OR** metoCLOPramide (REGLAN) injection, ondansetron **OR** ondansetron (ZOFRAN) IV, oxyCODONE, oxyCODONE, polyethylene glycol, senna-docusate, sodium phosphate, traMADol, zolpidem   Vital Signs    Vitals:   02/11/23 2127 02/12/23 0106 02/12/23 0824 02/12/23 1235  BP: (!) 129/90 (!) 128/99 (!) 111/95 (!) 114/92  Pulse:  82 74   Resp: 18 16 16 18   Temp: 98 F (36.7 C) 97.7 F (36.5 C) 97.7 F (36.5 C) 97.6 F (36.4 C)  TempSrc: Oral  Oral   SpO2: 97% 95% 96% 95%  Weight:      Height:        Intake/Output Summary (Last 24 hours) at 02/12/2023 1518 Last data filed at 02/12/2023 1422 Gross per 24 hour  Intake 240 ml  Output --  Net 240 ml      02/09/2023    1:45 PM 12/20/2022   10:20 AM 09/06/2022   10:03 AM  Last 3 Weights  Weight (lbs) 130 lb 1.1 oz 130 lb 6.4 oz 131 lb 8 oz  Weight (kg) 59 kg 59.149 kg 59.648 kg      Telemetry    Rate controlled atrial fibrillation rates of 70-90- Personally Reviewed  ECG    No new tracings- Personally Reviewed  Physical Exam   GEN: No acute distress.  Sitting upright in the recliner Neck: No JVD Cardiac: IR IR, no murmurs, rubs, or gallops.  Respiratory: Clear to  auscultation bilaterally.  Respirations remain unlabored on room air GI: Soft, nontender, non-distended  MS: No edema; No deformity. Neuro:  Nonfocal  Psych: Normal affect   Labs    High Sensitivity Troponin:  No results for input(s): "TROPONINIHS" in the last 720 hours.   Chemistry Recent Labs  Lab 02/10/23 0524 02/11/23 0433 02/12/23 0548  NA 132* 130* 129*  K 4.6 4.6 4.3  CL 99 99 99  CO2 24 22 24   GLUCOSE 129* 120* 103*  BUN 23 38* 42*  CREATININE 1.21* 1.44* 0.83  CALCIUM 8.7* 8.1* 8.5*  MG 2.1  --   --   GFRNONAA 45* 37* >60  ANIONGAP 9 9 6     Lipids No results for input(s): "CHOL", "TRIG", "HDL", "LABVLDL", "LDLCALC", "CHOLHDL" in the last 168 hours.  Hematology Recent Labs  Lab 02/10/23 0524 02/11/23 0433 02/12/23 0548  WBC 11.3* 10.0 9.2  RBC 4.34 3.81* 3.77*  HGB 13.6 12.0 11.9*  HCT 41.9 37.1 35.9*  MCV 96.5 97.4 95.2  MCH 31.3 31.5 31.6  MCHC 32.5 32.3 33.1  RDW 14.5 14.5 14.4  PLT 281 197 210   Thyroid No results for input(s): "TSH", "FREET4" in the last 168 hours.  BNPNo results for input(s): "BNP", "PROBNP" in the last 168 hours.  DDimer  No results for input(s): "DDIMER" in the last 168 hours.   Radiology    No results found.  Cardiac Studies  TTE 02/10/23 1. Left ventricular ejection fraction, by estimation, is 55 to 60%. The  left ventricle has normal function. The left ventricle has no regional  wall motion abnormalities. Left ventricular diastolic parameters are  indeterminate.   2. Right ventricular systolic function is normal. The right ventricular  size is moderately enlarged. There is normal pulmonary artery systolic  pressure. The estimated right ventricular systolic pressure is 35.0 mmHg.   3. Right atrial size was moderately dilated.   4. The mitral valve is normal in structure. Mild mitral valve  regurgitation. No evidence of mitral stenosis.   5. Tricuspid valve regurgitation is moderate to severe.   6. The aortic valve is  tricuspid. Aortic valve regurgitation is not  visualized. No aortic stenosis is present.   7. The inferior vena cava is normal in size with greater than 50%  respiratory variability, suggesting right atrial pressure of 3 mmHg.    Patient Profile     82 y.o. female with a past medical history of hypertension, COPD, idiopathic pulmonary fibrosis, and osteoporosis, who has been seen and evaluated for new onset atrial fibrillation in the setting of a fall with right femoral neck fracture fracture of the right anterior inferior glenoid.  Assessment & Plan    Atrial fibrillation in the setting of a fall with right femoral neck fracture and right anterior inferior glenoid fracture -Continues to remain in atrial fibrillation that is rate controlled on telemetry monitoring with rates of 70-90 -Continue on apixaban 2.5 mg twice daily for CHA2DS2-VASc score of at least 4 -Occasional palpitations noted with exertion, Toprol-XL increased to 25 mg daily -Continued on Toprol-XL with increasing dose to 25 mg daily first dose tonight -After 3 to 4 weeks of uninterrupted anticoagulation can consider cardioversion procedure -Continue telemetry monitoring  Hypertension -Blood pressure 114/92 -Continued on Toprol XL with the dose being increased to 25 mg daily at bedtime -Vital signs per unit protocol  COPD/idiopathic pulmonary fibrosis -Respirations are unlabored on room air -Some shortness of breath while working with physical therapy today -Management per IM  Right femoral neck fracture right anterior inferior glenoid fracture -Postop day 2 -Has worked with physical therapy several times today -Patient plan to go to rehab versus home after hospital stay -Management per orthopedics and IM     For questions or updates, please contact [facility] Please consult [email or web address] for contact info under        Signed, Graceanna Theissen, NP  02/12/2023, 3:18 PM

## 2023-02-12 NOTE — TOC Progression Note (Signed)
Transition of Care [facility]) - Progression Note    Patient Details  Name: Karen Henson MRN: [id number] Date of Birth: 1941-03-11  [facility] CM/SW Contact  Marlowe Sax, RN Phone Number: 02/12/2023, 2:38 PM  Clinical Narrative:  Met with the patient and her daughters in the room and reviewed the bed offers, I encouraged then to review medicare for the star ratings , they requested that I check with  [facility] rehab, admissions stated they can not offer a bed that they are full and have several waiting from their own hospital to get a Bed, The patient and her daughters will review the other bed offers, I explained that medicare guidelines say once they have a bed offer and she is medically stable she will DC    Expected Discharge Plan: Skilled Nursing Facility Barriers to Discharge: SNF Pending bed offer  Expected Discharge Plan and Services       Living arrangements for the past 2 months: Single Family Home                                       Social Determinants of Health (SDOH) Interventions SDOH Screenings   Food Insecurity: No Food Insecurity (02/09/2023)  Housing: Low Risk  (02/09/2023)  Transportation Needs: No Transportation Needs (02/09/2023)  Utilities: Not At Risk (02/09/2023)  Alcohol Screen: Low Risk  (09/06/2022)  Depression (PHQ2-9): Low Risk  (09/06/2022)  Financial Resource Strain: Low Risk  (08/11/2020)  Physical Activity: Sufficiently Active (08/11/2020)  Social Connections: Moderately Integrated (08/11/2020)  Stress: No Stress Concern Present (08/11/2020)  Tobacco Use: Medium Risk (02/11/2023)    Readmission Risk Interventions     No data to display

## 2023-02-12 NOTE — Progress Notes (Signed)
Progress Note   Patient: Karen Henson ZOX:[id number] DOB: [birth year] DOA: 02/09/2023     3 DOS: the patient was seen and examined on 02/12/2023   Brief hospital course: 82 year old female with history of COPD, hypertension, idiopathic pulmonary fibrosis, osteoporosis presents to the ED after a fall while playing T-ball with her grandson.  Landed on the cement with her right hip and right shoulder.  Found to have a right femoral neck fracture.  In ER also found to be in rapid atrial fibrillation.  5/12.  Blood pressure low this morning.  Antihypertensive medications held.  Patient given a fluid bolus.  Patient still in atrial fibrillation.  Patient had right hip hemiarthroplasty and right shoulder exam under anesthesia, closed management of glenoid fracture. 5/13.  Orthopedic surgery okay with starting Eliquis this evening. 5/14.  Patient walked out in the hallway.  Physical therapy recommending rehab.    Assessment and Plan: * Closed right hip fracture (HCC) Right hip hemiarthroplasty on 5/12 by Dr. Allena Katz.  Pain control.  PT and OT recommending rehab  Hypotension Prior to surgery.  This has improved.  Atrial fibrillation with RVR (HCC) May be chronic in nature.  Rate controlled on low-dose Toprol-XL at night.  Patient on Eliquis for anticoagulation.   COPD (chronic obstructive pulmonary disease) (HCC) - DuoNebs as needed for shortness of breath and wheezing - Continue home montelukast at bedtime  Fibrosis lung (HCC) History of IPF with no prior exacerbations.    Essential hypertension Low-dose Toprol-XL at night if blood pressure allows.  Constipation Add MiraLAX        Subjective: Patient feels okay.  Stated she walked into the hallway with physical therapy.  Seen sitting in the chair.  Still in atrial fibrillation.  On Eliquis for anticoagulation now.  Physical Exam: Vitals:   02/11/23 2127 02/12/23 0106 02/12/23 0824 02/12/23 1235  BP: (!) 129/90 (!) 128/99 (!)  111/95 (!) 114/92  Pulse:  82 74   Resp: 18 16 16 18   Temp: 98 F (36.7 C) 97.7 F (36.5 C) 97.7 F (36.5 C) 97.6 F (36.4 C)  TempSrc: Oral  Oral   SpO2: 97% 95% 96% 95%  Weight:      Height:       Physical Exam HENT:     Head: Normocephalic.     Mouth/Throat:     Pharynx: No oropharyngeal exudate.  Eyes:     General: Lids are normal.     Conjunctiva/sclera: Conjunctivae normal.  Cardiovascular:     Rate and Rhythm: Normal rate. Rhythm irregularly irregular.     Heart sounds: Normal heart sounds, S1 normal and S2 normal.  Pulmonary:     Breath sounds: No decreased breath sounds, wheezing, rhonchi or rales.  Abdominal:     Palpations: Abdomen is soft.     Tenderness: There is no abdominal tenderness.  Musculoskeletal:     Right lower leg: No swelling.     Left lower leg: No swelling.  Skin:    General: Skin is warm.     Findings: No rash.  Neurological:     Mental Status: She is alert and oriented to person, place, and time.     Data Reviewed: Sodium 129, creatinine 0.83, hemoglobin 11.9  Family Communication: Spoke with family at the bedside  Disposition: Status is: Inpatient Remains inpatient appropriate because: Postoperative day 2 for right hemiarthroplasty  Planned Discharge Destination: Rehab    Time spent: 28 minutes  Author: Alford Highland, MD 02/12/2023 4:06 PM  For on call review [email or web address].

## 2023-02-12 NOTE — Progress Notes (Signed)
Occupational Therapy Treatment Patient Details Name: Karen Henson MRN: [id number] DOB: Apr 07, 1941 Today's Date: 02/12/2023   History of present illness Pt is an 82 y.o. female presenting to hospital s/p fall (palying T-ball with great grandchild) c/o R hip, shoulder, and elbow pain.  Imaging showing R displaced femoral neck fx and R anterior/inferior glenoid rim fx.  Pt admitted with closed R hip fx, R shoulder fx, a-fib with RVR, and ground level fall.  S/p R hip hemiarthroplasty and R shoulder exam under anesthesia (closed management of glenoid fx) 02/10/23.   PMH includes COPD, htn, idiopathic pulmonary fibrosis, osteoporosis, breast sx.   OT comments  Patient received sitting up in recliner and agreeable to OT. Family present, but stepped out during session. Pt requesting to lay back down in bed due to fatigue. RUE sling already donned on arrival, Max A to adjust for optimal positioning. Pt required Min A to scoot her hips forward in recliner to prepare for functional transfer. VC provided for maintaining RUE NWB and posterior hip precautions t/o activity this date. Pt required Mod A to stand from recliner and complete step pivot transfer to EOB using hemi walker. Once sitting at EOB, pt required Max A to return to supine. Pt left as received with all needs in reach. Pt is making progress toward goal completion. D/C recommendation remains appropriate. OT will continue to follow acutely.     Recommendations for follow up therapy are one component of a multi-disciplinary discharge planning process, led by the attending physician.  Recommendations may be updated based on patient status, additional functional criteria and insurance authorization.    Assistance Recommended at Discharge Intermittent Supervision/Assistance  Patient can return home with the following  Two people to help with walking and/or transfers;A lot of help with bathing/dressing/bathroom;Assistance with cooking/housework;Assist  for transportation;Help with stairs or ramp for entrance   Equipment Recommendations  Other (comment) (defer to next venue of care)    Recommendations for Other Services      Precautions / Restrictions Precautions Precautions: Shoulder;Posterior Hip;Fall Shoulder Interventions: Shoulder sling/immobilizer;At all times;For comfort Precaution Booklet Issued: Yes (comment) Precaution Comments: R KI at all times except when in CPM/PT Required Braces or Orthoses: Knee Immobilizer - Right Restrictions Weight Bearing Restrictions: Yes RUE Weight Bearing: Non weight bearing RLE Weight Bearing: Weight bearing as tolerated Other Position/Activity Restrictions: Per ortho surgical note 02/10/23: "The patient will be WBAT on the RLE. Sling to RUE for comfort. Recommend NWB on RUE, but patient may use RUE to perform ADLs".       Mobility Bed Mobility Overal bed mobility: Needs Assistance Bed Mobility: Sit to Supine       Sit to supine: Max assist        Transfers Overall transfer level: Needs assistance Equipment used: Hemi-walker Transfers: Sit to/from Stand, Bed to chair/wheelchair/BSC Sit to Stand: Mod assist     Step pivot transfers: Mod assist     General transfer comment: VC to maintain RUE NWB, posterior hip precautions, and overall technique     Balance Overall balance assessment: Needs assistance Sitting-balance support: No upper extremity supported, Feet supported Sitting balance-Leahy Scale: Good     Standing balance support: No upper extremity supported Standing balance-Leahy Scale: Fair           ADL either performed or assessed with clinical judgement   ADL Overall ADL's : Needs assistance/impaired       Upper Body Dressing Details (indicate cue type and reason): RUE sling already donned on  arrival, Max A to adjust for optimal positioning     Toilet Transfer: Moderate assistance;Cueing for safety;Cueing for sequencing Toilet Transfer Details  (indicate cue type and reason): simulated with hemi walker                Extremity/Trunk Assessment              Vision Baseline Vision/History: 1 Wears glasses Patient Visual Report: No change from baseline     Perception     Praxis      Cognition Arousal/Alertness: Awake/alert Behavior During Therapy: Anxious Overall Cognitive Status: Within Functional Limits for tasks assessed            Exercises      Shoulder Instructions       General Comments R UE in sling    Pertinent Vitals/ Pain       Pain Assessment Pain Assessment: 0-10 Pain Score: 5  Pain Location: R shoulder and R hip Pain Descriptors / Indicators: Aching, Sore Pain Intervention(s): Limited activity within patient's tolerance, Monitored during session, Repositioned  Home Living          Prior Functioning/Environment              Frequency  Min 2X/week        Progress Toward Goals  OT Goals(current goals can now be found in the care plan section)  Progress towards OT goals: Progressing toward goals  Acute Rehab OT Goals Patient Stated Goal: to get stronger OT Goal Formulation: With patient/family Time For Goal Achievement: 02/25/23 Potential to Achieve Goals: Good  Plan Discharge plan remains appropriate;Frequency remains appropriate    Co-evaluation                 AM-PAC OT "6 Clicks" Daily Activity     Outcome Measure   Help from another person eating meals?: A Little Help from another person taking care of personal grooming?: A Little Help from another person toileting, which includes using toliet, bedpan, or urinal?: A Lot Help from another person bathing (including washing, rinsing, drying)?: A Lot Help from another person to put on and taking off regular upper body clothing?: A Lot Help from another person to put on and taking off regular lower body clothing?: A Lot 6 Click Score: 14    End of Session Equipment Utilized During Treatment: Gait  belt;Other (comment);Right knee immobilizer (hemi walker and R shoulder sling)  OT Visit Diagnosis: Unsteadiness on feet (R26.81);Repeated falls (R29.6);Muscle weakness (generalized) (M62.81)   Activity Tolerance Patient tolerated treatment well   Patient Left in bed;with call bell/phone within reach;with bed alarm set   Nurse Communication Mobility status        Time: 1610-9604 OT Time Calculation (min): 18 min  Charges: OT General Charges $OT Visit: 1 Visit OT Treatments $Self Care/Home Management : 8-22 mins  [facility] MS, OTR/L ascom [phone]  02/12/23, 5:05 PM

## 2023-02-12 NOTE — Assessment & Plan Note (Signed)
Add MiraLAX 

## 2023-02-12 NOTE — Progress Notes (Signed)
  Subjective: 2 Days Post-Op Procedure(s) (LRB): ARTHROPLASTY BIPOLAR HIP (HEMIARTHROPLASTY) (Right) and right shoulder glenoid rim fracture with closed reduction. Patient reports pain as mild.   Patient is well, and has had no acute complaints or problems Plan is to go Rehab versus home after hospital stay. Negative for chest pain and shortness of breath Fever: no Gastrointestinal: Negative for nausea and vomiting  Objective: Vital signs in last 24 hours: Temp:  [97.7 F (36.5 C)-98.3 F (36.8 C)] 97.7 F (36.5 C) (05/14 0106) Pulse Rate:  [75-95] 82 (05/14 0106) Resp:  [16-18] 16 (05/14 0106) BP: (111-129)/(82-99) 128/99 (05/14 0106) SpO2:  [94 %-100 %] 95 % (05/14 0106)  Intake/Output from previous day:  Intake/Output Summary (Last 24 hours) at 02/12/2023 0634 Last data filed at 02/11/2023 1500 Gross per 24 hour  Intake 240 ml  Output --  Net 240 ml    Intake/Output this shift: No intake/output data recorded.  Labs: Recent Labs    02/09/23 1520 02/10/23 0524 02/11/23 0433  HGB 13.6 13.6 12.0   Recent Labs    02/10/23 0524 02/11/23 0433  WBC 11.3* 10.0  RBC 4.34 3.81*  HCT 41.9 37.1  PLT 281 197   Recent Labs    02/10/23 0524 02/11/23 0433  NA 132* 130*  K 4.6 4.6  CL 99 99  CO2 24 22  BUN 23 38*  CREATININE 1.21* 1.44*  GLUCOSE 129* 120*  CALCIUM 8.7* 8.1*   No results for input(s): "LABPT", "INR" in the last 72 hours.   EXAM General - Patient is Alert and Oriented Extremity - Neurovascular intact Sensation intact distally Dorsiflexion/Plantar flexion intact Compartment soft with the abduction brace in place. The right arm has limited use with difficulty and discomfort with gentle motion.  Good grip strength. Dressing/Incision - clean, dry, no drainage Motor Function - intact, moving foot and toes well on exam.  Grip strength intact.  Ambulated 10 feet with physical therapy  Past Medical History:  Diagnosis Date   Allergy     Environmental Allergies   Arthritis    Bronchitis, chronic (HCC)    COPD (chronic obstructive pulmonary disease) (HCC)    Cough    CHRONIC   Dyspnea    OCCAS   Hypertension    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HCC)    Osteoporosis     Assessment/Plan: 2 Days Post-Op Procedure(s) (LRB): ARTHROPLASTY BIPOLAR HIP (HEMIARTHROPLASTY) (Right) Principal Problem:   Closed right hip fracture (HCC) Active Problems:   Essential hypertension   Fibrosis lung (HCC)   Atrial fibrillation with RVR (HCC)   COPD (chronic obstructive pulmonary disease) (HCC)   Fall   Hypotension  Estimated body mass index is 24.58 kg/m as calculated from the following:   Height as of this encounter: 5\' 1"  (1.549 m).   Weight as of this encounter: 59 kg. Advance diet Up with therapy D/C IV fluids  Discharge planning.  Plan to follow-up at [facility] clinic orthopedics in 2 weeks for x-ray of the right shoulder and right hip.  Staple removal.  DVT Prophylaxis - Lovenox, Foot Pumps, and TED hose Weight-Bearing as tolerated to right leg  Dedra Skeens, PA-C Orthopaedic Surgery 02/12/2023, 6:34 AM

## 2023-02-12 NOTE — Telephone Encounter (Signed)
Pharmacy Patient Advocate Encounter  Insurance verification completed.    The patient is insured through [employer] Medicare Part D   The patient is currently admitted and ran test claims for the following: Eliquis .  Copays and coinsurance results were relayed to Inpatient clinical team.  

## 2023-02-12 NOTE — Consult Note (Signed)
[facility] Customer service manager [facility]) Accountable Care Organization (ACO) [facility] Liaison Note  02/12/2023  [name] August 07, 1941 [id number]  Location: [facility] RN Hospital Liaison screened the patient remotely at [facility].  Insurance: MCR ACO   Karen Henson is a 82 y.o. female who is a Primary Care Patient of Bacigalupo, Marzella Schlein, MD ([facility]). The patient was screened for readmission hospitalization with noted medium risk score for unplanned readmission risk with 1 IP in 6 months.  The patient was assessed for potential [facility]) Care Management service needs for post hospital transition for care coordination. Review of patient's electronic medical record reveals patient was admitted for a fall resulting in a closed right hip fracture. Pt currently pending rehab awaiting bed offer for SNF level of care. Will refer to PAC-RN if facility appropriate.   Plan: [facility] Liaison will continue to follow progress and disposition to asess for post hospital community care coordination/management needs.  Referral request for community care coordination: Pending referral   [facility] Care Management/Population Health does not replace or interfere with any arrangements made by the Inpatient Transition of Care team.   For questions contact:   Elliot Cousin, RN, BSN [facility] Liaison [place]   [facility]  Population Health Office Hours MTWF 8:00 am to 6 pm off on Thursday [phone] mobile [phone] [Office toll free line][facility] Office Hours are M-F 8:30 - 5 pm 24 hour nurse advise line [phone] Conceirge  [email or web address]

## 2023-02-13 DIAGNOSIS — J8410 Pulmonary fibrosis, unspecified: Secondary | ICD-10-CM | POA: Diagnosis not present

## 2023-02-13 DIAGNOSIS — Z96641 Presence of right artificial hip joint: Secondary | ICD-10-CM | POA: Diagnosis not present

## 2023-02-13 DIAGNOSIS — I10 Essential (primary) hypertension: Secondary | ICD-10-CM | POA: Diagnosis not present

## 2023-02-13 DIAGNOSIS — N390 Urinary tract infection, site not specified: Secondary | ICD-10-CM | POA: Diagnosis not present

## 2023-02-13 DIAGNOSIS — K5900 Constipation, unspecified: Secondary | ICD-10-CM | POA: Diagnosis not present

## 2023-02-13 DIAGNOSIS — J309 Allergic rhinitis, unspecified: Secondary | ICD-10-CM | POA: Diagnosis not present

## 2023-02-13 DIAGNOSIS — M810 Age-related osteoporosis without current pathological fracture: Secondary | ICD-10-CM | POA: Diagnosis not present

## 2023-02-13 DIAGNOSIS — S42141D Displaced fracture of glenoid cavity of scapula, right shoulder, subsequent encounter for fracture with routine healing: Secondary | ICD-10-CM | POA: Diagnosis not present

## 2023-02-13 DIAGNOSIS — R69 Illness, unspecified: Secondary | ICD-10-CM | POA: Diagnosis not present

## 2023-02-13 DIAGNOSIS — I4891 Unspecified atrial fibrillation: Secondary | ICD-10-CM | POA: Diagnosis not present

## 2023-02-13 DIAGNOSIS — R197 Diarrhea, unspecified: Secondary | ICD-10-CM | POA: Diagnosis not present

## 2023-02-13 DIAGNOSIS — M25551 Pain in right hip: Secondary | ICD-10-CM | POA: Diagnosis not present

## 2023-02-13 DIAGNOSIS — W19XXXD Unspecified fall, subsequent encounter: Secondary | ICD-10-CM | POA: Diagnosis not present

## 2023-02-13 DIAGNOSIS — J449 Chronic obstructive pulmonary disease, unspecified: Secondary | ICD-10-CM | POA: Diagnosis not present

## 2023-02-13 DIAGNOSIS — R600 Localized edema: Secondary | ICD-10-CM | POA: Diagnosis not present

## 2023-02-13 DIAGNOSIS — Z7901 Long term (current) use of anticoagulants: Secondary | ICD-10-CM | POA: Diagnosis not present

## 2023-02-13 DIAGNOSIS — S72001D Fracture of unspecified part of neck of right femur, subsequent encounter for closed fracture with routine healing: Secondary | ICD-10-CM | POA: Diagnosis not present

## 2023-02-13 DIAGNOSIS — R252 Cramp and spasm: Secondary | ICD-10-CM | POA: Diagnosis not present

## 2023-02-13 DIAGNOSIS — S72001A Fracture of unspecified part of neck of right femur, initial encounter for closed fracture: Secondary | ICD-10-CM | POA: Diagnosis not present

## 2023-02-13 LAB — CBC
HCT: 38.2 % (ref 36.0–46.0)
Hemoglobin: 12.8 g/dL (ref 12.0–15.0)
MCH: 31.8 pg (ref 26.0–34.0)
MCHC: 33.5 g/dL (ref 30.0–36.0)
MCV: 94.8 fL (ref 80.0–100.0)
Platelets: 280 10*3/uL (ref 150–400)
RBC: 4.03 MIL/uL (ref 3.87–5.11)
RDW: 14.5 % (ref 11.5–15.5)
WBC: 10.6 10*3/uL — ABNORMAL HIGH (ref 4.0–10.5)
nRBC: 0 % (ref 0.0–0.2)

## 2023-02-13 LAB — BASIC METABOLIC PANEL
Anion gap: 9 (ref 5–15)
BUN: 23 mg/dL (ref 8–23)
CO2: 23 mmol/L (ref 22–32)
Calcium: 8.9 mg/dL (ref 8.9–10.3)
Chloride: 98 mmol/L (ref 98–111)
Creatinine, Ser: 0.71 mg/dL (ref 0.44–1.00)
GFR, Estimated: >60 mL/min (ref >60)
Glucose, Bld: 109 mg/dL — ABNORMAL HIGH (ref 70–99)
Potassium: 5.3 mmol/L — ABNORMAL HIGH (ref 3.5–5.1)
Sodium: 130 mmol/L — ABNORMAL LOW (ref 135–145)

## 2023-02-13 MED ORDER — ONDANSETRON HCL 4 MG PO TABS: 4.0000 mg | ORAL_TABLET | Freq: Four times a day (QID) | ORAL | 0 refills | Status: DC | PRN

## 2023-02-13 MED ORDER — DOCUSATE SODIUM 100 MG PO CAPS: 100.0000 mg | ORAL_CAPSULE | Freq: Two times a day (BID) | ORAL | 0 refills | Status: DC

## 2023-02-13 MED ORDER — METOPROLOL SUCCINATE ER 25 MG PO TB24: 25.0000 mg | ORAL_TABLET | Freq: Every day | ORAL | Status: DC

## 2023-02-13 MED ORDER — APIXABAN 2.5 MG PO TABS: 2.5000 mg | ORAL_TABLET | Freq: Two times a day (BID) | ORAL | Status: DC

## 2023-02-13 MED ORDER — POLYETHYLENE GLYCOL 3350 17 G PO PACK: 17.0000 g | PACK | Freq: Every day | ORAL | 0 refills | Status: DC | PRN

## 2023-02-13 NOTE — TOC Progression Note (Addendum)
Transition of Care [facility]) - Progression Note    Patient Details  Name: Karen Henson MRN: [id number] Date of Birth: 03-21-1941  Transition of Care [facility]) CM/SW Contact  Marlowe Sax, RN Phone Number: 02/13/2023, 9:45 AM  Clinical Narrative:   The patient's daughter Karen Henson notified me that they chose the bed offer from [facility], I notified Karen Henson at [facility]  I notified Karen Henson that the patient will DC to room 604 at [facility] today and EMS will transport EMS called she is number 3 on list  Expected Discharge Plan: Skilled Nursing Facility Barriers to Discharge: SNF Pending bed offer  Expected Discharge Plan and Services       Living arrangements for the past 2 months: Single Family Home Expected Discharge Date: 02/13/23                                     Social Determinants of Health (SDOH) Interventions SDOH Screenings   Food Insecurity: No Food Insecurity (02/09/2023)  Housing: Low Risk  (02/09/2023)  Transportation Needs: No Transportation Needs (02/09/2023)  Utilities: Not At Risk (02/09/2023)  Alcohol Screen: Low Risk  (09/06/2022)  Depression (PHQ2-9): Low Risk  (09/06/2022)  Financial Resource Strain: Low Risk  (08/11/2020)  Physical Activity: Sufficiently Active (08/11/2020)  Social Connections: Moderately Integrated (08/11/2020)  Stress: No Stress Concern Present (08/11/2020)  Tobacco Use: Medium Risk (02/11/2023)    Readmission Risk Interventions     No data to display

## 2023-02-13 NOTE — Discharge Summary (Signed)
Physician Discharge Summary  Karen Henson ZOX:[id number] DOB: May 10, 1941 DOA: 02/09/2023  PCP: Erasmo Downer, MD  Admit date: 02/09/2023 Discharge date: 02/13/2023  Admitted From: Home Disposition: Skilled nursing facility  Recommendations for Outpatient Follow-up:  Follow up with PCP in 1-2 weeks after discharge. Please obtain BMP/CBC in one week Orthopedic to schedule follow-up.   Discharge Condition: Fair CODE STATUS: Full code Diet recommendation: Low-salt diet  Discharge summary: 82 year old female with history of COPD, hypertension, idiopathic pulmonary fibrosis, osteoporosis presented to the ED after a fall while playing T-ball with her grandson.  Landed on the cement with her right hip and right shoulder.  Found to have a right femoral neck fracture.  In ER also found to be in rapid atrial fibrillation.   Patient had right hip hemiarthroplasty and right shoulder exam under anesthesia, closed management of glenoid fracture. Seen by cardiology.  Started on Eliquis, rate controlled on Toprol-XL.  Stable for discharge today.  Close traumatic right hip fracture Right hip hemiarthroplasty 5/12 Dr. Allena Katz Pain control, Tylenol, tramadol and occasional oxycodone along with bowel regimen. Weightbearing as tolerated. DVT prophylaxis, Eliquis 2.5 mg twice daily to continue until cardiology follow-up. Orthopedics to schedule follow-up.  Right glenoid fracture: Reduction under anesthesia.  Pain medications nonweightbearing.  New onset A-fib with RVR: Newly diagnosed.  Remains in A-fib but rate controlled on Toprol-XL.  Therapeutic on Eliquis.  Cardiology will schedule follow-up.  Ejection fraction normal.  TSH not done.  Essential hypertension: Blood pressure stable.  Starting on Toprol-XL.  Discontinue home medications. COPD: Without exacerbation.  History of idiopathic pulmonary fibrosis.  Bronchodilator as needed.  Currently stable.  Stable to discharge to a SNF.      Discharge Diagnoses:  Principal Problem:   Closed right hip fracture (HCC) Active Problems:   Hypotension   Atrial fibrillation with RVR (HCC)   COPD (chronic obstructive pulmonary disease) (HCC)   Fibrosis lung (HCC)   Essential hypertension   Fall   Constipation    Discharge Instructions  Discharge Instructions     Call MD for:  redness, tenderness, or signs of infection (pain, swelling, redness, odor or green/yellow discharge around incision site)   Complete by: As directed    Call MD for:  severe uncontrolled pain   Complete by: As directed    Diet - low sodium heart healthy   Complete by: As directed    Increase activity slowly   Complete by: As directed       Allergies as of 02/13/2023       Reactions   Lisinopril Cough        Medication List     STOP taking these medications    Claritin 10 MG tablet Generic drug: loratadine   dextromethorphan-guaiFENesin 30-600 MG 12hr tablet Commonly known as: MUCINEX DM   losartan-hydrochlorothiazide 50-12.5 MG tablet Commonly known as: HYZAAR       TAKE these medications    acetaminophen 500 MG tablet Commonly known as: TYLENOL Take 500 mg by mouth every 6 (six) hours as needed for moderate pain or headache.   albuterol 108 (90 Base) MCG/ACT inhaler Commonly known as: VENTOLIN HFA Inhale 2 puffs into the lungs every 6 (six) hours as needed for wheezing or shortness of breath.   apixaban 2.5 MG Tabs tablet Commonly known as: ELIQUIS Take 1 tablet (2.5 mg total) by mouth 2 (two) times daily.   docusate sodium 100 MG capsule Commonly known as: COLACE Take 1 capsule (100 mg total) by mouth  2 (two) times daily.   metoprolol succinate 25 MG 24 hr tablet Commonly known as: TOPROL-XL Take 1 tablet (25 mg total) by mouth at bedtime.   montelukast 10 MG tablet Commonly known as: SINGULAIR TAKE 1 TABLET BY MOUTH BEDTIME FOR ASTHMA What changed: See the new instructions.   ondansetron 4 MG  tablet Commonly known as: ZOFRAN Take 1 tablet (4 mg total) by mouth every 6 (six) hours as needed for nausea.   oxyCODONE 5 MG immediate release tablet Commonly known as: Oxy IR/ROXICODONE Take 0.5-1 tablets (2.5-5 mg total) by mouth every 4 (four) hours as needed for moderate pain (pain score 4-6).   polyethylene glycol 17 g packet Commonly known as: MIRALAX / GLYCOLAX Take 17 g by mouth daily as needed for moderate constipation.   traMADol 50 MG tablet Commonly known as: ULTRAM Take 1 tablet (50 mg total) by mouth every 6 (six) hours as needed for moderate pain.        Follow-up Information     Dedra Skeens, PA-C Follow up in 2 week(s).   Specialty: Orthopedic Surgery Why: For x-rays of the right shoulder and the right hip.  Staple removal Contact information: [address] [place] [address] [phone]                Allergies  Allergen Reactions   Lisinopril Cough    Consultations: Orthopedics Cardiology   Procedures/Studies: ECHOCARDIOGRAM COMPLETE  Result Date: 02/10/2023    ECHOCARDIOGRAM REPORT   Patient Name:   Karen Henson Date of Exam: 02/10/2023 Medical Rec #:  [id number]       Height:       61.0 in Accession #:    [id number]      Weight:       130.1 lb Date of Birth:  October 09, 1940       BSA:          1.573 m Patient Age:    82 years        BP:           117/83 mmHg Patient Gender: F               HR:           96 bpm. Exam Location:  [facility] Procedure: 2D Echo, Color Doppler and Cardiac Doppler Indications:     Atrial fibrlillation I48.91  History:         Patient has no prior history of Echocardiogram examinations.                  COPD; Risk Factors:Hypertension. Lung fibrosis.  Sonographer:     Cristela Blue Referring Phys:  [id number] Verdene Lennert Diagnosing Phys: Julien Nordmann MD  Sonographer Comments: Suboptimal apical window. IMPRESSIONS  1. Left ventricular ejection fraction, by estimation, is 55 to 60%. The left  ventricle has normal function. The left ventricle has no regional wall motion abnormalities. Left ventricular diastolic parameters are indeterminate.  2. Right ventricular systolic function is normal. The right ventricular size is moderately enlarged. There is normal pulmonary artery systolic pressure. The estimated right ventricular systolic pressure is 35.0 mmHg.  3. Right atrial size was moderately dilated.  4. The mitral valve is normal in structure. Mild mitral valve regurgitation. No evidence of mitral stenosis.  5. Tricuspid valve regurgitation is moderate to severe.  6. The aortic valve is tricuspid. Aortic valve regurgitation is not visualized. No aortic stenosis is present.  7. The inferior vena  cava is normal in size with greater than 50% respiratory variability, suggesting right atrial pressure of 3 mmHg. FINDINGS  Left Ventricle: Left ventricular ejection fraction, by estimation, is 55 to 60%. The left ventricle has normal function. The left ventricle has no regional wall motion abnormalities. The left ventricular internal cavity size was normal in size. There is  no left ventricular hypertrophy. Left ventricular diastolic parameters are indeterminate. Right Ventricle: The right ventricular size is moderately enlarged. No increase in right ventricular wall thickness. Right ventricular systolic function is normal. There is normal pulmonary artery systolic pressure. The tricuspid regurgitant velocity is 2.74 m/s, and with an assumed right atrial pressure of 5 mmHg, the estimated right ventricular systolic pressure is 35.0 mmHg. Left Atrium: Left atrial size was normal in size. Right Atrium: Right atrial size was moderately dilated. Pericardium: There is no evidence of pericardial effusion. Mitral Valve: The mitral valve is normal in structure. Mild mitral valve regurgitation. No evidence of mitral valve stenosis. Tricuspid Valve: The tricuspid valve is normal in structure. Tricuspid valve regurgitation is  moderate to severe. No evidence of tricuspid stenosis. Aortic Valve: The aortic valve is tricuspid. Aortic valve regurgitation is not visualized. No aortic stenosis is present. Aortic valve mean gradient measures 1.0 mmHg. Aortic valve peak gradient measures 2.3 mmHg. Aortic valve area, by VTI measures 3.02 cm. Pulmonic Valve: The pulmonic valve was normal in structure. Pulmonic valve regurgitation is not visualized. No evidence of pulmonic stenosis. Aorta: The aortic root is normal in size and structure. Venous: The inferior vena cava is normal in size with greater than 50% respiratory variability, suggesting right atrial pressure of 3 mmHg. IAS/Shunts: No atrial level shunt detected by color flow Doppler.  LEFT VENTRICLE PLAX 2D LVIDd:         3.90 cm LVIDs:         2.80 cm LV PW:         0.80 cm LV IVS:        1.30 cm LVOT diam:     2.00 cm LV SV:         31 LV SV Index:   20 LVOT Area:     3.14 cm  RIGHT VENTRICLE RV Basal diam:  4.50 cm RV Mid diam:    4.10 cm LEFT ATRIUM             Index        RIGHT ATRIUM           Index LA diam:        2.90 cm 1.84 cm/m   RA Area:     22.20 cm LA Vol (A2C):   38.4 ml 24.41 ml/m  RA Volume:   69.70 ml  44.31 ml/m LA Vol (A4C):   36.9 ml 23.46 ml/m LA Biplane Vol: 39.9 ml 25.36 ml/m  AORTIC VALVE AV Area (Vmax):    2.70 cm AV Area (Vmean):   2.42 cm AV Area (VTI):     3.02 cm AV Vmax:           76.30 cm/s AV Vmean:          53.700 cm/s AV VTI:            0.103 m AV Peak Grad:      2.3 mmHg AV Mean Grad:      1.0 mmHg LVOT Vmax:         65.50 cm/s LVOT Vmean:        41.400 cm/s LVOT VTI:  0.099 m LVOT/AV VTI ratio: 0.96  AORTA Ao Root diam: 2.90 cm MITRAL VALVE               TRICUSPID VALVE MV Area (PHT): 4.12 cm    TR Peak grad:   30.0 mmHg MV Decel Time: 184 msec    TR Vmax:        274.00 cm/s MV E velocity: 56.10 cm/s                            SHUNTS                            Systemic VTI:  0.10 m                            Systemic Diam: 2.00 cm  Julien Nordmann MD Electronically signed by Julien Nordmann MD Signature Date/Time: 02/10/2023/7:45:31 PM    Final    DG Pelvis Portable  Result Date: 02/10/2023 CLINICAL DATA:  [id number] S/P hip hemiarthroplasty [id number] EXAM: PORTABLE PELVIS 1-2 VIEWS COMPARISON:  [id number] S/P hip hemiarthroplasty [id number] FINDINGS: Postsurgical changes of right hip arthroplasty. Normal alignment. No evidence of loosening or fracture. Expected soft tissue changes. Moderate left hip osteoarthritis. IMPRESSION: Postsurgical changes of right hip arthroplasty. Normal alignment. No evidence of immediate hardware complication. Electronically Signed   By: Caprice Renshaw M.D.   On: 02/10/2023 15:32   DG HIP UNILAT WITH PELVIS 1V RIGHT  Result Date: 02/10/2023 CLINICAL DATA:  Elective surgery. EXAM: DG HIP (WITH OR WITHOUT PELVIS) 1V RIGHT COMPARISON:  Preoperative imaging FINDINGS: Single intraoperative decubitus view of the pelvis/right hip obtained in the operating room during right hip surgery. IMPRESSION: Intraoperative spot view during right hip surgery. Electronically Signed   By: Narda Rutherford M.D.   On: 02/10/2023 14:21   CT Shoulder Right Wo Contrast  Result Date: 02/09/2023 CLINICAL DATA:  Fall, pain EXAM: CT OF THE UPPER RIGHT EXTREMITY WITHOUT CONTRAST TECHNIQUE: Multidetector CT imaging of the upper right extremity was performed according to the standard protocol. RADIATION DOSE REDUCTION: This exam was performed according to the departmental dose-optimization program which includes automated exposure control, adjustment of the mA and/or kV according to patient size and/or use of iterative reconstruction technique. COMPARISON:  Right shoulder x-ray earlier same day FINDINGS: Bones/Joint/Cartilage There is an acute comminuted fracture of the anterior inferior glenoid. Fracture fragments are distracted 6 mm. There is no dislocation. No focal osseous lesion identified. Ligaments Suboptimally assessed by CT. Muscles and  Tendons No intramuscular hematoma. Soft tissues There is deep soft tissue edema surrounding the humeral head. Small joint effusion present. IMPRESSION: Acute comminuted fracture of the anterior inferior glenoid. Electronically Signed   By: Darliss Cheney M.D.   On: 02/09/2023 17:17   CT Hip Right Wo Contrast  Result Date: 02/09/2023 CLINICAL DATA:  Fall EXAM: CT OF THE RIGHT HIP WITHOUT CONTRAST TECHNIQUE: Multidetector CT imaging of the right hip was performed according to the standard protocol. Multiplanar CT image reconstructions were also generated. RADIATION DOSE REDUCTION: This exam was performed according to the departmental dose-optimization program which includes automated exposure control, adjustment of the mA and/or kV according to patient size and/or use of iterative reconstruction technique. COMPARISON:  Right hip x-ray same day FINDINGS: Bones/Joint/Cartilage The bones are osteopenic. There is an acute fracture of the right femoral neck. There is 1 cm  of superior displacement of the distal fracture fragment with apex superolateral angulation. There is no dislocation. No additional fractures are identified. Ligaments Suboptimally assessed by CT. Muscles and Tendons No intramuscular hematoma. Soft tissues Lipohemarthrosis is noted in the right hip. There is mild deep soft tissue edema surrounding the right hip. IMPRESSION: 1. Acute angulated and displaced fracture of the right femoral neck. 2. Lipohemarthrosis in the right hip. Electronically Signed   By: Darliss Cheney M.D.   On: 02/09/2023 17:15   DG Hip Unilat W or Wo Pelvis 2-3 Views Right  Result Date: 02/09/2023 CLINICAL DATA:  Fall. EXAM: DG HIP (WITH OR WITHOUT PELVIS) 2-3V RIGHT COMPARISON:  None Available. FINDINGS: Bones are diffusely demineralized. SI joints and symphysis pubis unremarkable. Right femoral neck fracture with varus angulation. Fracture line is not well demonstrated but is probably transcervical to basicervical. There is  some lucency in the humeral head raising the question of pathologic fracture. IMPRESSION: 1. Right femoral neck fracture with varus angulation. 2. Lucency in the femoral head raises the question of pathologic fracture. Electronically Signed   By: Kennith Center M.D.   On: 02/09/2023 15:27   DG Shoulder Right  Result Date: 02/09/2023 CLINICAL DATA:  Fall EXAM: RIGHT SHOULDER - 2+ VIEW COMPARISON:  None Available. FINDINGS: The bones are osteopenic. There is some linear lucencies through the inferior glenoid which may be secondary to degenerative change; however, acute nondisplaced fractures not excluded. Joint spaces are well maintained and alignment is anatomic. There are minimal degenerative changes of the acromioclavicular joint. The soft tissues are within normal limits. IMPRESSION: 1. Linear lucencies through the inferior glenoid which may be secondary to degenerative change; however, acute nondisplaced fractures not excluded. 2. Osteopenia. Electronically Signed   By: Darliss Cheney M.D.   On: 02/09/2023 15:26   DG Elbow Complete Right  Result Date: 02/09/2023 CLINICAL DATA:  Fall.  Hematoma noted to right elbow. EXAM: RIGHT ELBOW - COMPLETE 3+ VIEW COMPARISON:  None Available. FINDINGS: There is no evidence of fracture, dislocation, or joint effusion. No focal bone abnormality. Soft tissue swelling is noted overlying the posterior aspect of the elbow/proximal forearm. IMPRESSION: No fracture or dislocation of the right elbow. Electronically Signed   By: Sherron Ales M.D.   On: 02/09/2023 15:25   DG Chest 1 View  Result Date: 02/09/2023 CLINICAL DATA:  Injury EXAM: CHEST  1 VIEW COMPARISON:  Chest x-ray 10/23/2021 FINDINGS: The heart is enlarged, unchanged. The lungs are clear. There is no pleural effusion or pneumothorax. No acute fractures are identified. IMPRESSION: Cardiomegaly without acute cardiopulmonary process. Electronically Signed   By: Darliss Cheney M.D.   On: 02/09/2023 15:24   (Echo,  Carotid, EGD, Colonoscopy, ERCP)    Subjective: Patient seen and examined.  2 daughters at the bedside.  Later on she is able to walk in the hallway with a walker.  Looks comfortable.  A-fib with heart rate anywhere between 70-90 on monitor.   Discharge Exam: Vitals:   02/12/23 2351 02/13/23 0727  BP: (!) 118/92 (!) 127/96  Pulse: 72 95  Resp: 14 18  Temp: 98 F (36.7 C) 98.2 F (36.8 C)  SpO2: 96% 97%   Vitals:   02/12/23 1235 02/12/23 1803 02/12/23 2351 02/13/23 0727  BP: (!) 114/92 (!) 127/92 (!) 118/92 (!) 127/96  Pulse:  91 72 95  Resp: 18 20 14 18   Temp: 97.6 F (36.4 C) 98.4 F (36.9 C) 98 F (36.7 C) 98.2 F (36.8 C)  TempSrc:  SpO2: 95% 96% 96% 97%  Weight:      Height:        General: Pt is alert, awake, not in acute distress Pleasant and interactive. Cardiovascular: Irregularly irregular., S1/S2 +, no rubs, no gallops Respiratory: CTA bilaterally, no wheezing, no rhonchi Abdominal: Soft, NT, ND, bowel sounds + Extremities:  Right arm on sling. Right leg incision intact and dry.  Distal neurovascular status intact.    The results of significant diagnostics from this hospitalization (including imaging, microbiology, ancillary and laboratory) are listed below for reference.     Microbiology: No results found for this or any previous visit (from the past 240 hour(s)).   Labs: BNP (last 3 results) No results for input(s): "BNP" in the last 8760 hours. Basic Metabolic Panel: Recent Labs  Lab 02/09/23 1520 02/10/23 0524 02/11/23 0433 02/12/23 0548 02/13/23 0512  NA 133* 132* 130* 129* 130*  K 3.9 4.6 4.6 4.3 5.3*  CL 100 99 99 99 98  CO2 26 24 22 24 23   GLUCOSE 121* 129* 120* 103* 109*  BUN 18 23 38* 42* 23  CREATININE 0.78 1.21* 1.44* 0.83 0.71  CALCIUM 9.0 8.7* 8.1* 8.5* 8.9  MG  --  2.1  --   --   --   PHOS  --  6.4*  --   --   --    Liver Function Tests: No results for input(s): "AST", "ALT", "ALKPHOS", "BILITOT", "PROT", "ALBUMIN"  in the last 168 hours. No results for input(s): "LIPASE", "AMYLASE" in the last 168 hours. No results for input(s): "AMMONIA" in the last 168 hours. CBC: Recent Labs  Lab 02/09/23 1520 02/10/23 0524 02/11/23 0433 02/12/23 0548 02/13/23 0512  WBC 9.2 11.3* 10.0 9.2 10.6*  NEUTROABS 7.2  --   --   --   --   HGB 13.6 13.6 12.0 11.9* 12.8  HCT 41.6 41.9 37.1 35.9* 38.2  MCV 95.9 96.5 97.4 95.2 94.8  PLT 262 281 197 210 280   Cardiac Enzymes: No results for input(s): "CKTOTAL", "CKMB", "CKMBINDEX", "TROPONINI" in the last 168 hours. BNP: Invalid input(s): "POCBNP" CBG: Recent Labs  Lab 02/10/23 0817  GLUCAP 125*   D-Dimer No results for input(s): "DDIMER" in the last 72 hours. Hgb A1c No results for input(s): "HGBA1C" in the last 72 hours. Lipid Profile No results for input(s): "CHOL", "HDL", "LDLCALC", "TRIG", "CHOLHDL", "LDLDIRECT" in the last 72 hours. Thyroid function studies No results for input(s): "TSH", "T4TOTAL", "T3FREE", "THYROIDAB" in the last 72 hours.  Invalid input(s): "FREET3" Anemia work up No results for input(s): "VITAMINB12", "FOLATE", "FERRITIN", "TIBC", "IRON", "RETICCTPCT" in the last 72 hours. Urinalysis    Component Value Date/Time   BILIRUBINUR Negative 08/23/2020 0834   PROTEINUR Positive (A) 11/15/2018 0916   UROBILINOGEN negative (A) 08/23/2020 0834   NITRITE Positive 08/23/2020 0834   LEUKOCYTESUR Large (3+) (A) 08/23/2020 0834   Sepsis Labs Recent Labs  Lab 02/10/23 0524 02/11/23 0433 02/12/23 0548 02/13/23 0512  WBC 11.3* 10.0 9.2 10.6*   Microbiology No results found for this or any previous visit (from the past 240 hour(s)).   Time coordinating discharge:  35 minutes  SIGNED:   Dorcas Carrow, MD  [facility] 02/13/2023, 9:34 AM

## 2023-02-13 NOTE — Progress Notes (Signed)
  Subjective: 3 Days Post-Op Procedure(s) (LRB): ARTHROPLASTY BIPOLAR HIP (HEMIARTHROPLASTY) (Right) and right shoulder glenoid rim fracture with closed reduction. Patient reports pain as mild.   Patient is well, and has had no acute complaints or problems Plan is to go Rehab versus home after hospital stay. Negative for chest pain and shortness of breath Fever: no Gastrointestinal: Negative for nausea and vomiting  Objective: Vital signs in last 24 hours: Temp:  [97.6 F (36.4 C)-98.4 F (36.9 C)] 98 F (36.7 C) (05/14 2351) Pulse Rate:  [72-91] 72 (05/14 2351) Resp:  [14-20] 14 (05/14 2351) BP: (111-127)/(92-95) 118/92 (05/14 2351) SpO2:  [95 %-96 %] 96 % (05/14 2351)  Intake/Output from previous day:  Intake/Output Summary (Last 24 hours) at 02/13/2023 0619 Last data filed at 02/12/2023 1931 Gross per 24 hour  Intake 300 ml  Output --  Net 300 ml    Intake/Output this shift: Total I/O In: 60 [P.O.:60] Out: -   Labs: Recent Labs    02/11/23 0433 02/12/23 0548 02/13/23 0512  HGB 12.0 11.9* 12.8   Recent Labs    02/12/23 0548 02/13/23 0512  WBC 9.2 10.6*  RBC 3.77* 4.03  HCT 35.9* 38.2  PLT 210 280   Recent Labs    02/12/23 0548 02/13/23 0512  NA 129* 130*  K 4.3 5.3*  CL 99 98  CO2 24 23  BUN 42* 23  CREATININE 0.83 0.71  GLUCOSE 103* 109*  CALCIUM 8.5* 8.9   No results for input(s): "LABPT", "INR" in the last 72 hours.   EXAM General - Patient is Alert and Oriented Extremity - Neurovascular intact Sensation intact distally Dorsiflexion/Plantar flexion intact Compartment soft with the abduction brace in place. The right arm has limited use with difficulty and discomfort with gentle motion.  Good grip strength. Dressing/Incision - clean, dry, scant drainage Motor Function - intact, moving foot and toes well on exam.  Grip strength intact.  Ambulated 40 feet with physical therapy  Past Medical History:  Diagnosis Date   Allergy     Environmental Allergies   Arthritis    Bronchitis, chronic (HCC)    COPD (chronic obstructive pulmonary disease) (HCC)    Cough    CHRONIC   Dyspnea    OCCAS   Hypertension    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HCC)    Osteoporosis     Assessment/Plan: 3 Days Post-Op Procedure(s) (LRB): ARTHROPLASTY BIPOLAR HIP (HEMIARTHROPLASTY) (Right) Principal Problem:   Closed right hip fracture (HCC) Active Problems:   Essential hypertension   Fibrosis lung (HCC)   Atrial fibrillation with RVR (HCC)   COPD (chronic obstructive pulmonary disease) (HCC)   Fall   Hypotension   Constipation  Estimated body mass index is 24.58 kg/m as calculated from the following:   Height as of this encounter: 5\' 1"  (1.549 m).   Weight as of this encounter: 59 kg. Advance diet Up with therapy D/C IV fluids  Discharge planning.  Plan to follow-up at [facility] clinic orthopedics in 2 weeks for x-ray of the right shoulder and right hip.  Staple removal.  DVT Prophylaxis - Lovenox, Foot Pumps, and TED hose Weight-Bearing as tolerated to right leg  Dedra Skeens, PA-C Orthopaedic Surgery 02/13/2023, 6:19 AM

## 2023-02-13 NOTE — Progress Notes (Signed)
Physical Therapy Treatment Patient Details Name: Karen Henson MRN: [id number] DOB: 01-22-1941 Today's Date: 02/13/2023   History of Present Illness Pt is an 82 y.o. female presenting to hospital s/p fall (palying T-ball with great grandchild) c/o R hip, shoulder, and elbow pain.  Imaging showing R displaced femoral neck fx and R anterior/inferior glenoid rim fx.  Pt admitted with closed R hip fx, R shoulder fx, a-fib with RVR, and ground level fall.  S/p R hip hemiarthroplasty and R shoulder exam under anesthesia (closed management of glenoid fx) 02/10/23.   PMH includes COPD, htn, idiopathic pulmonary fibrosis, osteoporosis, breast sx.    PT Comments    Pt pleasant and eager to work with PT. On arrival pt out of sling, readjusted and educated pt/family on appropriate positioning and precautions.  Pt was able to rise to standing with only light assist, adjusted HW to more functional/appropriate height with good results with posture, consistency and confidence during gait.  Pt with some fatigue with >50 ft of ambulation but no LOBs and good overall effort.  Continue with PT per POC.      Recommendations for follow up therapy are one component of a multi-disciplinary discharge planning process, led by the attending physician.  Recommendations may be updated based on patient status, additional functional criteria and insurance authorization.  Follow Up Recommendations  Can patient physically be transported by private vehicle: No    Assistance Recommended at Discharge Frequent or constant Supervision/Assistance  Patient can return home with the following A little help with walking and/or transfers;A lot of help with bathing/dressing/bathroom;Assistance with cooking/housework;Assist for transportation;Help with stairs or ramp for entrance   Equipment Recommendations  BSC/3in1;Other (comment) (hemi-walker)    Recommendations for Other Services       Precautions / Restrictions  Precautions Precautions: Shoulder;Posterior Hip;Fall Type of Shoulder Precautions: fx, in sling Shoulder Interventions: Shoulder sling/immobilizer;At all times;For comfort Required Braces or Orthoses: Knee Immobilizer - Right Restrictions RUE Weight Bearing: Non weight bearing RLE Weight Bearing: Weight bearing as tolerated Other Position/Activity Restrictions: Per ortho surgical note 02/10/23: "The patient will be WBAT on the RLE. Sling to RUE for comfort. Recommend NWB on RUE, but patient may use RUE to perform ADLs".     Mobility  Bed Mobility               General bed mobility comments: Deferred (pt in recliner beginning/end of session).    Transfers Overall transfer level: Needs assistance Equipment used: Hemi-walker Transfers: Sit to/from Stand Sit to Stand: Min assist           General transfer comment: With cuing for set up/hand placement, HW use and sequencing pt was able to rise from recliner with only minimal tactile cuing to insure forward weight shift and general safety    Ambulation/Gait Ambulation/Gait assistance: Min assist Gait Distance (Feet): 55 Feet Assistive device: Hemi-walker Gait Pattern/deviations: Step-to pattern       General Gait Details: Pt initially with very slow and hesitant gait that did improve when PT raised HW 2 notches with pt showing increased posture, cadence and subjective confidence.  Still slow and guarded but much improved consistency with cuing and AD use reinfocement.  Pt did fatigue diwth the effort but overall did well w/o overt LOBs.   Stairs             Wheelchair Mobility    Modified Rankin (Stroke Patients Only)       Balance Overall balance assessment: Needs assistance Sitting-balance support:  No upper extremity supported, Feet supported Sitting balance-Leahy Scale: Good     Standing balance support: Single extremity supported Standing balance-Leahy Scale: Fair                               Cognition Arousal/Alertness: Awake/alert Behavior During Therapy: WFL for tasks assessed/performed Overall Cognitive Status: Within Functional Limits for tasks assessed                                          Exercises General Exercises - Lower Extremity Ankle Circles/Pumps: AROM, 10 reps Quad Sets: Strengthening, 10 reps Hip ABduction/ADduction: AROM, 10 reps Straight Leg Raises: AAROM, 10 reps    General Comments        Pertinent Vitals/Pain Pain Assessment Pain Assessment: 0-10 Pain Score: 3  Pain Location: R shoulder and R hip Pain Intervention(s): Limited activity within patient's tolerance    Home Living                          Prior Function            PT Goals (current goals can now be found in the care plan section) Progress towards PT goals: Progressing toward goals    Frequency    BID      PT Plan Current plan remains appropriate    Co-evaluation              AM-PAC PT "6 Clicks" Mobility   Outcome Measure  Help needed turning from your back to your side while in a flat bed without using bedrails?: A Little Help needed moving from lying on your back to sitting on the side of a flat bed without using bedrails?: A Little Help needed moving to and from a bed to a chair (including a wheelchair)?: A Little Help needed standing up from a chair using your arms (e.g., wheelchair or bedside chair)?: A Little Help needed to walk in hospital room?: A Little Help needed climbing 3-5 steps with a railing? : A Lot 6 Click Score: 17    End of Session Equipment Utilized During Treatment: Gait belt;Other (comment) (R sling, not wearing appropriately on arrival; PTadjusted) Activity Tolerance: Patient tolerated treatment well Patient left: in chair;with call bell/phone within reach (on commode, nursing staff assiting) Nurse Communication: Mobility status PT Visit Diagnosis: Unsteadiness on feet (R26.81);Other  abnormalities of gait and mobility (R26.89);Muscle weakness (generalized) (M62.81);History of falling (Z91.81);Pain Pain - Right/Left: Right Pain - part of body: Shoulder;Hip     Time: 0922-0950 PT Time Calculation (min) (ACUTE ONLY): 28 min  Charges:  $Gait Training: 8-22 mins $Therapeutic Exercise: 8-22 mins                     Malachi Pro, DPT 02/13/2023, 12:11 PM

## 2023-02-14 ENCOUNTER — Telehealth: Payer: Self-pay | Admitting: Nurse Practitioner

## 2023-02-14 NOTE — Telephone Encounter (Signed)
-----   Message from Dalia Heading sent at 02/14/2023  9:14 AM EDT ----- Regarding: FW: Hospital follow up Patient saw Nicolasa Ducking, NP inpatient ----- Message ----- From: Charlsie Quest, NP Sent: 02/13/2023   5:20 PM EDT To: Myrtie Soman Scheduling Subject: Hospital follow up                             Will need an appointment with APP in 4 weeks with EKG. New onset a-fib in the hospital. Please and thank you, Lavonna Rua

## 2023-02-14 NOTE — Telephone Encounter (Signed)
Left voicemail to schedule 4 week hospital follow up appt.

## 2023-02-15 DIAGNOSIS — J449 Chronic obstructive pulmonary disease, unspecified: Secondary | ICD-10-CM | POA: Diagnosis not present

## 2023-02-15 DIAGNOSIS — K5900 Constipation, unspecified: Secondary | ICD-10-CM | POA: Diagnosis not present

## 2023-02-15 DIAGNOSIS — W19XXXD Unspecified fall, subsequent encounter: Secondary | ICD-10-CM | POA: Diagnosis not present

## 2023-02-15 DIAGNOSIS — I4891 Unspecified atrial fibrillation: Secondary | ICD-10-CM | POA: Diagnosis not present

## 2023-02-15 DIAGNOSIS — R252 Cramp and spasm: Secondary | ICD-10-CM | POA: Diagnosis not present

## 2023-02-15 DIAGNOSIS — S42141D Displaced fracture of glenoid cavity of scapula, right shoulder, subsequent encounter for fracture with routine healing: Secondary | ICD-10-CM | POA: Diagnosis not present

## 2023-02-15 DIAGNOSIS — I10 Essential (primary) hypertension: Secondary | ICD-10-CM | POA: Diagnosis not present

## 2023-02-15 DIAGNOSIS — S72001D Fracture of unspecified part of neck of right femur, subsequent encounter for closed fracture with routine healing: Secondary | ICD-10-CM | POA: Diagnosis not present

## 2023-02-15 DIAGNOSIS — Z7901 Long term (current) use of anticoagulants: Secondary | ICD-10-CM | POA: Diagnosis not present

## 2023-02-18 DIAGNOSIS — K5900 Constipation, unspecified: Secondary | ICD-10-CM | POA: Diagnosis not present

## 2023-02-18 DIAGNOSIS — I10 Essential (primary) hypertension: Secondary | ICD-10-CM | POA: Diagnosis not present

## 2023-02-18 DIAGNOSIS — S42141D Displaced fracture of glenoid cavity of scapula, right shoulder, subsequent encounter for fracture with routine healing: Secondary | ICD-10-CM | POA: Diagnosis not present

## 2023-02-18 DIAGNOSIS — Z7901 Long term (current) use of anticoagulants: Secondary | ICD-10-CM | POA: Diagnosis not present

## 2023-02-18 DIAGNOSIS — S72001D Fracture of unspecified part of neck of right femur, subsequent encounter for closed fracture with routine healing: Secondary | ICD-10-CM | POA: Diagnosis not present

## 2023-02-18 DIAGNOSIS — R252 Cramp and spasm: Secondary | ICD-10-CM | POA: Diagnosis not present

## 2023-02-20 DIAGNOSIS — S42141D Displaced fracture of glenoid cavity of scapula, right shoulder, subsequent encounter for fracture with routine healing: Secondary | ICD-10-CM | POA: Diagnosis not present

## 2023-02-20 DIAGNOSIS — I4891 Unspecified atrial fibrillation: Secondary | ICD-10-CM | POA: Diagnosis not present

## 2023-02-20 DIAGNOSIS — J449 Chronic obstructive pulmonary disease, unspecified: Secondary | ICD-10-CM | POA: Diagnosis not present

## 2023-02-20 DIAGNOSIS — Z7901 Long term (current) use of anticoagulants: Secondary | ICD-10-CM | POA: Diagnosis not present

## 2023-02-20 DIAGNOSIS — K5900 Constipation, unspecified: Secondary | ICD-10-CM | POA: Diagnosis not present

## 2023-02-20 DIAGNOSIS — I10 Essential (primary) hypertension: Secondary | ICD-10-CM | POA: Diagnosis not present

## 2023-02-20 DIAGNOSIS — R252 Cramp and spasm: Secondary | ICD-10-CM | POA: Diagnosis not present

## 2023-02-20 DIAGNOSIS — S72001D Fracture of unspecified part of neck of right femur, subsequent encounter for closed fracture with routine healing: Secondary | ICD-10-CM | POA: Diagnosis not present

## 2023-02-22 DIAGNOSIS — S72001D Fracture of unspecified part of neck of right femur, subsequent encounter for closed fracture with routine healing: Secondary | ICD-10-CM | POA: Diagnosis not present

## 2023-02-22 DIAGNOSIS — S42141D Displaced fracture of glenoid cavity of scapula, right shoulder, subsequent encounter for fracture with routine healing: Secondary | ICD-10-CM | POA: Diagnosis not present

## 2023-02-22 DIAGNOSIS — I10 Essential (primary) hypertension: Secondary | ICD-10-CM | POA: Diagnosis not present

## 2023-02-22 DIAGNOSIS — K5900 Constipation, unspecified: Secondary | ICD-10-CM | POA: Diagnosis not present

## 2023-02-22 DIAGNOSIS — Z7901 Long term (current) use of anticoagulants: Secondary | ICD-10-CM | POA: Diagnosis not present

## 2023-02-26 DIAGNOSIS — Z96641 Presence of right artificial hip joint: Secondary | ICD-10-CM | POA: Diagnosis not present

## 2023-02-26 DIAGNOSIS — M25551 Pain in right hip: Secondary | ICD-10-CM | POA: Diagnosis not present

## 2023-02-27 DIAGNOSIS — J449 Chronic obstructive pulmonary disease, unspecified: Secondary | ICD-10-CM | POA: Diagnosis not present

## 2023-02-27 DIAGNOSIS — S72001D Fracture of unspecified part of neck of right femur, subsequent encounter for closed fracture with routine healing: Secondary | ICD-10-CM | POA: Diagnosis not present

## 2023-02-27 DIAGNOSIS — R252 Cramp and spasm: Secondary | ICD-10-CM | POA: Diagnosis not present

## 2023-02-27 DIAGNOSIS — S42141D Displaced fracture of glenoid cavity of scapula, right shoulder, subsequent encounter for fracture with routine healing: Secondary | ICD-10-CM | POA: Diagnosis not present

## 2023-02-27 DIAGNOSIS — I4891 Unspecified atrial fibrillation: Secondary | ICD-10-CM | POA: Diagnosis not present

## 2023-02-27 DIAGNOSIS — K5900 Constipation, unspecified: Secondary | ICD-10-CM | POA: Diagnosis not present

## 2023-02-27 DIAGNOSIS — Z7901 Long term (current) use of anticoagulants: Secondary | ICD-10-CM | POA: Diagnosis not present

## 2023-03-01 DIAGNOSIS — S72001D Fracture of unspecified part of neck of right femur, subsequent encounter for closed fracture with routine healing: Secondary | ICD-10-CM | POA: Diagnosis not present

## 2023-03-01 DIAGNOSIS — Z7901 Long term (current) use of anticoagulants: Secondary | ICD-10-CM | POA: Diagnosis not present

## 2023-03-01 DIAGNOSIS — S42141D Displaced fracture of glenoid cavity of scapula, right shoulder, subsequent encounter for fracture with routine healing: Secondary | ICD-10-CM | POA: Diagnosis not present

## 2023-03-01 DIAGNOSIS — I10 Essential (primary) hypertension: Secondary | ICD-10-CM | POA: Diagnosis not present

## 2023-03-01 DIAGNOSIS — K5900 Constipation, unspecified: Secondary | ICD-10-CM | POA: Diagnosis not present

## 2023-03-01 DIAGNOSIS — I4891 Unspecified atrial fibrillation: Secondary | ICD-10-CM | POA: Diagnosis not present

## 2023-03-01 DIAGNOSIS — R600 Localized edema: Secondary | ICD-10-CM | POA: Diagnosis not present

## 2023-03-08 DIAGNOSIS — R197 Diarrhea, unspecified: Secondary | ICD-10-CM | POA: Diagnosis not present

## 2023-03-08 DIAGNOSIS — I10 Essential (primary) hypertension: Secondary | ICD-10-CM | POA: Diagnosis not present

## 2023-03-08 DIAGNOSIS — I4891 Unspecified atrial fibrillation: Secondary | ICD-10-CM | POA: Diagnosis not present

## 2023-03-08 DIAGNOSIS — Z7901 Long term (current) use of anticoagulants: Secondary | ICD-10-CM | POA: Diagnosis not present

## 2023-03-08 DIAGNOSIS — J449 Chronic obstructive pulmonary disease, unspecified: Secondary | ICD-10-CM | POA: Diagnosis not present

## 2023-03-08 DIAGNOSIS — S72001D Fracture of unspecified part of neck of right femur, subsequent encounter for closed fracture with routine healing: Secondary | ICD-10-CM | POA: Diagnosis not present

## 2023-03-11 ENCOUNTER — Ambulatory Visit: Payer: Medicare Other | Admitting: Family Medicine

## 2023-03-11 DIAGNOSIS — S72001D Fracture of unspecified part of neck of right femur, subsequent encounter for closed fracture with routine healing: Secondary | ICD-10-CM | POA: Diagnosis not present

## 2023-03-11 DIAGNOSIS — S42141D Displaced fracture of glenoid cavity of scapula, right shoulder, subsequent encounter for fracture with routine healing: Secondary | ICD-10-CM | POA: Diagnosis not present

## 2023-03-11 DIAGNOSIS — K5900 Constipation, unspecified: Secondary | ICD-10-CM | POA: Diagnosis not present

## 2023-03-11 DIAGNOSIS — R197 Diarrhea, unspecified: Secondary | ICD-10-CM | POA: Diagnosis not present

## 2023-03-11 DIAGNOSIS — I10 Essential (primary) hypertension: Secondary | ICD-10-CM | POA: Diagnosis not present

## 2023-03-11 DIAGNOSIS — I4891 Unspecified atrial fibrillation: Secondary | ICD-10-CM | POA: Diagnosis not present

## 2023-03-11 DIAGNOSIS — J449 Chronic obstructive pulmonary disease, unspecified: Secondary | ICD-10-CM | POA: Diagnosis not present

## 2023-03-11 DIAGNOSIS — Z7901 Long term (current) use of anticoagulants: Secondary | ICD-10-CM | POA: Diagnosis not present

## 2023-03-11 DIAGNOSIS — R252 Cramp and spasm: Secondary | ICD-10-CM | POA: Diagnosis not present

## 2023-03-11 NOTE — Telephone Encounter (Signed)
Pt will be discharged from [facility] tomorrow and Aldine Contes is needing a verbal order for PT,OT,speech therapy and nursing.Please call Clydie Braun at [phone]

## 2023-03-14 ENCOUNTER — Ambulatory Visit (INDEPENDENT_AMBULATORY_CARE_PROVIDER_SITE_OTHER): Payer: Medicare Other | Admitting: Family Medicine

## 2023-03-14 VITALS — BP 133/98 | HR 92 | Temp 97.9°F | Wt 120.0 lb

## 2023-03-14 DIAGNOSIS — Z09 Encounter for follow-up examination after completed treatment for conditions other than malignant neoplasm: Secondary | ICD-10-CM

## 2023-03-14 DIAGNOSIS — R195 Other fecal abnormalities: Secondary | ICD-10-CM

## 2023-03-14 DIAGNOSIS — Z96641 Presence of right artificial hip joint: Secondary | ICD-10-CM

## 2023-03-14 DIAGNOSIS — I10 Essential (primary) hypertension: Secondary | ICD-10-CM | POA: Diagnosis not present

## 2023-03-14 DIAGNOSIS — I4891 Unspecified atrial fibrillation: Secondary | ICD-10-CM

## 2023-03-14 DIAGNOSIS — S42141D Displaced fracture of glenoid cavity of scapula, right shoulder, subsequent encounter for fracture with routine healing: Secondary | ICD-10-CM | POA: Diagnosis not present

## 2023-03-14 DIAGNOSIS — E871 Hypo-osmolality and hyponatremia: Secondary | ICD-10-CM | POA: Diagnosis not present

## 2023-03-14 DIAGNOSIS — I9589 Other hypotension: Secondary | ICD-10-CM | POA: Diagnosis not present

## 2023-03-14 NOTE — Patient Instructions (Addendum)
Increase metoprolol succinate to 50 mg (two tablets of metoprolol succinate 25mg  every night at bedtime).  Stop docusate (Colace).  May restart your normal bowel movement scheduled prior to hospitalization does not resume or if otherwise developing constipation.  If restarting, start with 1 tablet daily; may increase back up to one tablet twice daily if needed (wait at least 24 hours from resumption).  If your loose stools resume after restarting, skip one dose.

## 2023-03-14 NOTE — Progress Notes (Signed)
Established patient visit   Patient: Karen Henson   DOB: 30-Apr-1941   82 y.o. Female  MRN: [id number] Visit Date: 03/14/2023  Today's healthcare provider: Sherlyn Hay, DO   Chief Complaint  Patient presents with   Hospitalization Follow-up   Atrial Fibrillation   Fall   Subjective    HPI  Follow up Hospitalization  Patient was admitted to [facility] on 02/09/2023 and discharged on 02/13/2023. She was treated for Right femoral neck fracture after a fall while playing T-Ball with grandson. She lost her footing while trying to retrieve the ball.  Treatment for this included right hip hemiarthroplasty and right shoulder exam under anesthesia, with closed management of a right glenoid fracture.  She was also found to have new-onset Atrial Fibrillation with rapid ventricular response.  Toprol XL controlled her rate but she remained in A. Fib and was put on Eliquis by cardiology.    Patient was hypotensive while inpatient, her home medications for hypertension were discontinued once put on Toprol.  Her COPD remained stable without exacerbation - she has lung fibrosis and uses albuterol when that flares but has had no recent need.  She follows up with pulmonology regularly and her next appointment is March 2025.  Patient was discharged from hospital to nursing home for rehabilitation and was discharged from rehabilitation this past Tuesday, 03/12/2023.  Patient has appointment with cardiology on 03/20/2023  Lives alone, but daughters live close by Karen Henson lives right next door. Karen Henson is close by).  [facility] already aware to contact office for home health approval.  She has not yet stayed alone in her home.  She has not been doing exercises for her legs since discharge Tuesday but has gotten up and walked around.  She also endorses some loose stool, which she was associating with her use of Macrobid for urinary tract infection.  For her intermittent pain in her hip, she has  been taking Tylenol as needed and has only taken the oxycodone once.   ----------------------------------------------------------------------------------------- -   Medications: Outpatient Medications Prior to Visit  Medication Sig   acetaminophen (TYLENOL) 500 MG tablet Take 500 mg by mouth every 6 (six) hours as needed for moderate pain or headache.   apixaban (ELIQUIS) 2.5 MG TABS tablet Take 1 tablet (2.5 mg total) by mouth 2 (two) times daily.   docusate sodium (COLACE) 100 MG capsule Take 1 capsule (100 mg total) by mouth 2 (two) times daily.   metoprolol succinate (TOPROL-XL) 25 MG 24 hr tablet Take 1 tablet (25 mg total) by mouth at bedtime.   montelukast (SINGULAIR) 10 MG tablet TAKE 1 TABLET BY MOUTH BEDTIME FOR ASTHMA (Patient taking differently: Take 10 mg by mouth at bedtime.)   ondansetron (ZOFRAN) 4 MG tablet Take 1 tablet (4 mg total) by mouth every 6 (six) hours as needed for nausea.   oxyCODONE (OXY IR/ROXICODONE) 5 MG immediate release tablet Take 0.5-1 tablets (2.5-5 mg total) by mouth every 4 (four) hours as needed for moderate pain (pain score 4-6).   polyethylene glycol (MIRALAX / GLYCOLAX) 17 g packet Take 17 g by mouth daily as needed for moderate constipation.   traMADol (ULTRAM) 50 MG tablet Take 1 tablet (50 mg total) by mouth every 6 (six) hours as needed for moderate pain.   albuterol (VENTOLIN HFA) 108 (90 Base) MCG/ACT inhaler Inhale 2 puffs into the lungs every 6 (six) hours as needed for wheezing or shortness of breath. (Patient not taking: Reported on  02/09/2023)   No facility-administered medications prior to visit.    Review of Systems  Constitutional:  Negative for chills and fever.  Eyes:  Negative for visual disturbance.  Respiratory:  Negative for chest tightness and shortness of breath.   Cardiovascular:  Negative for chest pain, palpitations and leg swelling.  Gastrointestinal:  Negative for abdominal pain, constipation, diarrhea, nausea and  vomiting.       + loose stool, no incontinence or rush/difficulty getting to bathroom in time  Genitourinary:  Positive for frequency (has to get up 2-3x overnight). Negative for difficulty urinating and dysuria.  Musculoskeletal:  Positive for joint swelling (right hip; significantly improved).       +mild intermittent right groin pain  Skin:  Negative for color change and rash.  Neurological:  Negative for dizziness, speech difficulty, weakness, light-headedness, numbness and headaches.                               Objective    BP (!) 133/98 (BP Location: Left Arm, Patient Position: Sitting, Cuff Size: Normal)   Pulse 92   Temp 97.9 F (36.6 C) (Oral)   Wt 120 lb (54.4 kg)   SpO2 98%   BMI 22.67 kg/m  Vitals:   03/14/23 1020 03/14/23 1028  BP: (!) 134/112 (!) 133/98  Pulse: 92   Temp: 97.9 F (36.6 C)   TempSrc: Oral   SpO2: 98%   Weight: 120 lb (54.4 kg)      Physical Exam Vitals reviewed.  Constitutional:      General: She is awake. She is not in acute distress.    Appearance: Normal appearance. She is well-developed and normal weight. She is not diaphoretic.  HENT:     Head: Normocephalic and atraumatic.  Eyes:     General: No scleral icterus.    Conjunctiva/sclera: Conjunctivae normal.  Neck:     Thyroid: No thyromegaly.  Cardiovascular:     Rate and Rhythm: Normal rate. Rhythm irregularly irregular.     Pulses: Normal pulses.          Radial pulses are 2+ on the right side and 2+ on the left side.       Dorsalis pedis pulses are 2+ on the right side and 2+ on the left side.       Posterior tibial pulses are 2+ on the right side and 2+ on the left side.     Heart sounds: Normal heart sounds. No murmur heard.    Comments: Intermittent increased PMI  Pulmonary:     Effort: Pulmonary effort is normal. No tachypnea, bradypnea or respiratory distress.     Breath sounds: Normal breath sounds. No wheezing, rhonchi or rales.  Abdominal:     General: Bowel sounds  are normal. There is no distension.     Palpations: Abdomen is soft. There is no mass.     Tenderness: There is no abdominal tenderness. There is no guarding or rebound.     Hernia: No hernia is present.  Musculoskeletal:     Cervical back: Neck supple.     Right lower leg: No edema.     Left lower leg: No edema.  Lymphadenopathy:     Cervical: No cervical adenopathy.  Skin:    General: Skin is warm and dry.     Findings: No rash.  Neurological:     Mental Status: She is alert and oriented to person, place, and time.  Mental status is at baseline.  Psychiatric:        Mood and Affect: Mood normal.        Behavior: Behavior normal. Behavior is cooperative.      No results found for any visits on 03/14/23.  Assessment & Plan    1. Hospital discharge follow-up Patient here for routine hospital follow-up and states that she has been doing well since she was discharged from the rehabilitation facility. - Ambulatory referral to Home Health  2. Status post total hip replacement, right Patient has been able to get up and walk around on her own utilizing a cane and endorses not having a lot of pain.  She has follow-up scheduled with orthopedics 03/25/2023.  Will go ahead and refer for home health physical therapy and nursing aide support, as patient has required assistance with routine activities of daily living, including showering. - Ambulatory referral to Home Health  3. Closed displaced fracture of glenoid cavity of right scapula with routine healing, subsequent encounter Referral for home health PT placed below.  Patient will need support with range of motion exercises and eventually strengthening exercises.  Will defer to orthopedics for specific recommendations. - Ambulatory referral to Home Health  4. Loose stools Patient has been having loose, though not watery, stools.  Prior to her hospitalization, she did not have any difficulties with constipation or diarrhea.  She states she  has not been using the as-needed MiraLAX that was prescribed for her at the hospital, but she has been taking the twice daily Colace.  She has not been using much of the oxycodone, which I explained to her would cause constipation.  I discussed with her and her daughters that her loose stools likely stem from her use of Colace in the absence of much use of her oxycodone, rather than the antibiotic she is taking.  Advised her to go ahead and stop taking the Colace and monitor her bowel movements.  Advised her to restart with Colace once daily if she finds that she is having difficulty having bowel movements; she may increase back to twice daily if needed.  Discussed that she should skip a dose if the loose stools recur.  5. Chronic hyponatremia Patient has had persistent levels of hyponatremia since at least May of last year.  Encouraged her to resume using salt in her diet, as salt was withheld during her hospitalization, and her sodium levels were persistently in the 130s.  She will likely need this rechecked on her follow-up visit.  6. Essential hypertension Given patient's development of hypotension after being started on metoprolol, will not restart her losartan.  However, we will increase her metoprolol as noted below.  7. Iatrogenic hypotension Addressed as above.  8. Atrial fibrillation, unspecified type [facility]) Patient continues to have a somewhat elevated rate while on metoprolol succinate 25 mg, and her blood pressure is elevated.  Will go ahead and increase patient's metoprolol succinate dose to 50 mg daily, which she is taking at bedtime.  Patient will be using 2 of her current 25 mg tablets until she sees cardiology 03/20/2023.     Return if symptoms worsen or fail to improve, and as scheduled with Dr. Beryle Flock.      The entirety of the information documented in the History of Present Illness, Review of Systems and Physical Exam were personally obtained by me. Portions of this  information were initially documented by the CMA, Adline Peals, and reviewed by me for thoroughness and accuracy.  I discussed the assessment and treatment plan with the patient  The patient was provided an opportunity to ask questions and all were answered. The patient agreed with the plan and demonstrated an understanding of the instructions.   The patient was advised to call back or seek an in-person evaluation if the symptoms worsen or if the condition fails to improve as anticipated.    Sherlyn Hay, DO  [facility] [phone] (phone) [phone] (fax)  [facility]

## 2023-03-15 ENCOUNTER — Telehealth: Payer: Self-pay | Admitting: Family Medicine

## 2023-03-15 DIAGNOSIS — J84112 Idiopathic pulmonary fibrosis: Secondary | ICD-10-CM | POA: Diagnosis not present

## 2023-03-15 DIAGNOSIS — Z96641 Presence of right artificial hip joint: Secondary | ICD-10-CM | POA: Diagnosis not present

## 2023-03-15 DIAGNOSIS — J4489 Other specified chronic obstructive pulmonary disease: Secondary | ICD-10-CM | POA: Diagnosis not present

## 2023-03-15 DIAGNOSIS — M8000XA Age-related osteoporosis with current pathological fracture, unspecified site, initial encounter for fracture: Secondary | ICD-10-CM | POA: Insufficient documentation

## 2023-03-15 DIAGNOSIS — Z9181 History of falling: Secondary | ICD-10-CM | POA: Diagnosis not present

## 2023-03-15 DIAGNOSIS — K5900 Constipation, unspecified: Secondary | ICD-10-CM | POA: Diagnosis not present

## 2023-03-15 DIAGNOSIS — I4891 Unspecified atrial fibrillation: Secondary | ICD-10-CM | POA: Diagnosis not present

## 2023-03-15 DIAGNOSIS — M80051D Age-related osteoporosis with current pathological fracture, right femur, subsequent encounter for fracture with routine healing: Secondary | ICD-10-CM | POA: Diagnosis not present

## 2023-03-15 DIAGNOSIS — M80011D Age-related osteoporosis with current pathological fracture, right shoulder, subsequent encounter for fracture with routine healing: Secondary | ICD-10-CM | POA: Diagnosis not present

## 2023-03-15 DIAGNOSIS — Z87891 Personal history of nicotine dependence: Secondary | ICD-10-CM | POA: Diagnosis not present

## 2023-03-15 DIAGNOSIS — I10 Essential (primary) hypertension: Secondary | ICD-10-CM | POA: Diagnosis not present

## 2023-03-15 DIAGNOSIS — Z7901 Long term (current) use of anticoagulants: Secondary | ICD-10-CM | POA: Diagnosis not present

## 2023-03-15 DIAGNOSIS — M1990 Unspecified osteoarthritis, unspecified site: Secondary | ICD-10-CM | POA: Diagnosis not present

## 2023-03-15 NOTE — Telephone Encounter (Signed)
Melissa calling [facility] is calling to report that patient was Admitted for Nursing, PT, & OT today CB- [phone]

## 2023-03-19 DIAGNOSIS — J84112 Idiopathic pulmonary fibrosis: Secondary | ICD-10-CM | POA: Diagnosis not present

## 2023-03-19 DIAGNOSIS — M80051D Age-related osteoporosis with current pathological fracture, right femur, subsequent encounter for fracture with routine healing: Secondary | ICD-10-CM | POA: Diagnosis not present

## 2023-03-19 DIAGNOSIS — M80011D Age-related osteoporosis with current pathological fracture, right shoulder, subsequent encounter for fracture with routine healing: Secondary | ICD-10-CM | POA: Diagnosis not present

## 2023-03-19 DIAGNOSIS — I4891 Unspecified atrial fibrillation: Secondary | ICD-10-CM | POA: Diagnosis not present

## 2023-03-19 DIAGNOSIS — I10 Essential (primary) hypertension: Secondary | ICD-10-CM | POA: Diagnosis not present

## 2023-03-19 DIAGNOSIS — J4489 Other specified chronic obstructive pulmonary disease: Secondary | ICD-10-CM | POA: Diagnosis not present

## 2023-03-20 ENCOUNTER — Encounter: Payer: Self-pay | Admitting: Nurse Practitioner

## 2023-03-20 ENCOUNTER — Other Ambulatory Visit
Admission: RE | Admit: 2023-03-20 | Discharge: 2023-03-20 | Disposition: A | Discharge status: Home / Self Care | Payer: Medicare Other | Source: Ambulatory Visit | Attending: Nurse Practitioner | Admitting: Nurse Practitioner

## 2023-03-20 ENCOUNTER — Ambulatory Visit: Payer: Medicare Other | Attending: Nurse Practitioner | Admitting: Nurse Practitioner

## 2023-03-20 VITALS — BP 130/80 | HR 59 | Ht 62.0 in | Wt 128.0 lb

## 2023-03-20 DIAGNOSIS — I4819 Other persistent atrial fibrillation: Secondary | ICD-10-CM | POA: Diagnosis not present

## 2023-03-20 DIAGNOSIS — J84112 Idiopathic pulmonary fibrosis: Secondary | ICD-10-CM | POA: Diagnosis not present

## 2023-03-20 DIAGNOSIS — M80051D Age-related osteoporosis with current pathological fracture, right femur, subsequent encounter for fracture with routine healing: Secondary | ICD-10-CM | POA: Diagnosis not present

## 2023-03-20 DIAGNOSIS — I4891 Unspecified atrial fibrillation: Secondary | ICD-10-CM | POA: Insufficient documentation

## 2023-03-20 DIAGNOSIS — M80011D Age-related osteoporosis with current pathological fracture, right shoulder, subsequent encounter for fracture with routine healing: Secondary | ICD-10-CM | POA: Diagnosis not present

## 2023-03-20 DIAGNOSIS — I10 Essential (primary) hypertension: Secondary | ICD-10-CM | POA: Diagnosis not present

## 2023-03-20 DIAGNOSIS — J4489 Other specified chronic obstructive pulmonary disease: Secondary | ICD-10-CM | POA: Diagnosis not present

## 2023-03-20 LAB — BASIC METABOLIC PANEL
Anion gap: 11 (ref 5–15)
BUN: 11 mg/dL (ref 8–23)
CO2: 24 mmol/L (ref 22–32)
Calcium: 8.8 mg/dL — ABNORMAL LOW (ref 8.9–10.3)
Chloride: 100 mmol/L (ref 98–111)
Creatinine, Ser: 0.66 mg/dL (ref 0.44–1.00)
GFR, Estimated: >60 mL/min (ref >60)
Glucose, Bld: 92 mg/dL (ref 70–99)
Potassium: 4.2 mmol/L (ref 3.5–5.1)
Sodium: 135 mmol/L (ref 135–145)

## 2023-03-20 LAB — CBC
HCT: 41.1 % (ref 36.0–46.0)
Hemoglobin: 12.9 g/dL (ref 12.0–15.0)
MCH: 30.8 pg (ref 26.0–34.0)
MCHC: 31.4 g/dL (ref 30.0–36.0)
MCV: 98.1 fL (ref 80.0–100.0)
Platelets: 310 10*3/uL (ref 150–400)
RBC: 4.19 MIL/uL (ref 3.87–5.11)
RDW: 14.7 % (ref 11.5–15.5)
WBC: 8.3 10*3/uL (ref 4.0–10.5)
nRBC: 0 % (ref 0.0–0.2)

## 2023-03-20 MED ORDER — APIXABAN 2.5 MG PO TABS: 2.5000 mg | ORAL_TABLET | Freq: Two times a day (BID) | ORAL | 3 refills | Status: DC

## 2023-03-20 MED ORDER — METOPROLOL SUCCINATE ER 50 MG PO TB24: 50.0000 mg | ORAL_TABLET | Freq: Every day | ORAL | 3 refills | Status: DC

## 2023-03-20 NOTE — Progress Notes (Signed)
Office Visit    Patient Name: Karen Henson Date of Encounter: 03/20/2023  Primary Care Provider:  Erasmo Downer, MD Primary Cardiologist:  Julien Nordmann, MD  Chief Complaint    82 y.o. y/o female with a history of hypertension, COPD, idiopathic pulmonary fibrosis, and osteoporosis, who presents for follow-up after recent admission for right femoral neck fracture and right anterior inferior glenoid fracture complicated by new onset atrial fibrillation.  Past Medical History    Past Medical History:  Diagnosis Date   Allergy    Environmental Allergies   Arthritis    Bronchitis, chronic (HCC)    COPD (chronic obstructive pulmonary disease) (HCC)    Cough    CHRONIC   Dyspnea    OCCAS   Hypertension    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HCC)    Osteoporosis    Persistent atrial fibrillation (HCC)    a. Dx 01/2023 in setting of hip fx->rate controlled w/ bb. CHA2DS2VASc = 4-->eliquis 2.5 bid.   Tricuspid regurgitation    a. 01/2023 Echo: EF 55-60%, no rwma, nl RV fxn, RVSP . Mod dil RA. Mild MR. Mod-Sev TR.   Past Surgical History:  Procedure Laterality Date   BREAST EXCISIONAL BIOPSY Left 1996   neg   BREAST SURGERY     CAPSULOTOMY     CATARACT EXTRACTION W/PHACO Left 10/31/2017   Procedure: CATARACT EXTRACTION PHACO AND INTRAOCULAR LENS PLACEMENT (IOC);  Surgeon: Nevada Crane, MD;  Location: [facility] ORS;  Service: Ophthalmology;  Laterality: Left;  [place] 00:43.7 AP% 19.6 CDE 8.57 FLUID PACK LOT # [id number]   CATARACT EXTRACTION W/PHACO Right 12/05/2017   Procedure: CATARACT EXTRACTION PHACO AND INTRAOCULAR LENS PLACEMENT (IOC);  Surgeon: Nevada Crane, MD;  Location: [facility] ORS;  Service: Ophthalmology;  Laterality: Right;  fluid pack lot #[id number]  exp 07/01/2019 [place]    00:31.4 AP%    8.8 CDE    2.79   COLONOSCOPY  02/15/2006   COLONOSCOPY WITH PROPOFOL N/A 03/21/2016   Procedure: COLONOSCOPY WITH PROPOFOL;  Surgeon: Earline Mayotte, MD;  Location: [facility] ENDOSCOPY;   Service: Endoscopy;  Laterality: N/A;   COLONOSCOPY WITH PROPOFOL N/A 03/29/2021   Procedure: COLONOSCOPY WITH PROPOFOL;  Surgeon: Earline Mayotte, MD;  Location: [facility] ENDOSCOPY;  Service: Endoscopy;  Laterality: N/A;   DILATION AND CURETTAGE OF UTERUS     EYE SURGERY     HIP ARTHROPLASTY Right 02/10/2023   Procedure: ARTHROPLASTY BIPOLAR HIP (HEMIARTHROPLASTY);  Surgeon: Signa Kell, MD;  Location: [facility] ORS;  Service: Orthopedics;  Laterality: Right;   VARICOSE VEIN SURGERY      Allergies  Allergies  Allergen Reactions   Lisinopril Cough    History of Present Illness      82 y.o. y/o female with a history of hypertension, COPD, idiopathic pulmonary fibrosis, and osteoporosis.  She was recently admitted to [facility] after mechanical fall with resultant right shoulder and hip pain.  She was hypertensive on arrival and found to be in A-fib with RVR.  Imaging showed acute comminuted fracture of the right anterior inferior glenoid and a acute angulated and displaced fracture of the right femoral neck.  She was seen by our team with improved rates on beta-blocker therapy.  Echo showed normal LV function with RVSP of 35 mmHg and moderate to severe tricuspid regurgitation.  She underwent right hip hemiarthroplasty and was subsequently placed on Eliquis 2.5 mg twice daily in the setting of age greater than 80 and weight of less than 60 kg (CHA2DS2-VASc  4).  She was d/c'd to SNF w/ plan for outpt f/u and consideration for DCCV after 3 wks of therapeutic OAC.     ? blocker dose was increased to 50 mg daily at primary care visit on June 13 in the setting heart rates in the 90s.  She is now at home and doing well.  Physical therapy starts this evening.  She is present with her daughters today.  She denies chest pain, palpitations, dyspnea, PND, orthopnea, dizziness, syncope, or early satiety.  She has been having mild right lower extremity swelling ever since her surgery, though this is improving  and is not apparent today.  She remains rate controlled atrial fibrillation at a rate of 59 today.  She is asymptomatic.  Long discussion with patient and family regarding options for management.  Home Medications    Current Outpatient Medications  Medication Sig Dispense Refill   acetaminophen (TYLENOL) 500 MG tablet Take 500 mg by mouth every 6 (six) hours as needed for moderate pain or headache.     docusate sodium (COLACE) 100 MG capsule Take 1 capsule (100 mg total) by mouth 2 (two) times daily. 10 capsule 0   montelukast (SINGULAIR) 10 MG tablet TAKE 1 TABLET BY MOUTH BEDTIME FOR ASTHMA 90 tablet 1   ondansetron (ZOFRAN) 4 MG tablet Take 1 tablet (4 mg total) by mouth every 6 (six) hours as needed for nausea. 20 tablet 0   oxyCODONE (OXY IR/ROXICODONE) 5 MG immediate release tablet Take 0.5-1 tablets (2.5-5 mg total) by mouth every 4 (four) hours as needed for moderate pain (pain score 4-6). 30 tablet 0   polyethylene glycol (MIRALAX / GLYCOLAX) 17 g packet Take 17 g by mouth daily as needed for moderate constipation. 14 each 0   traMADol (ULTRAM) 50 MG tablet Take 1 tablet (50 mg total) by mouth every 6 (six) hours as needed for moderate pain. 30 tablet 0   albuterol (VENTOLIN HFA) 108 (90 Base) MCG/ACT inhaler Inhale 2 puffs into the lungs every 6 (six) hours as needed for wheezing or shortness of breath. (Patient not taking: Reported on 02/09/2023)     apixaban (ELIQUIS) 2.5 MG TABS tablet Take 1 tablet (2.5 mg total) by mouth 2 (two) times daily. 180 tablet 3   metoprolol succinate (TOPROL-XL) 50 MG 24 hr tablet Take 1 tablet (50 mg total) by mouth at bedtime. 90 tablet 3   No current facility-administered medications for this visit.     Review of Systems    Still with right hip and right shoulder pain but overall improving.  She denies chest pain, palpitations, dyspnea, pnd, orthopnea, n, v, dizziness, syncope, edema, weight gain, or early satiety.  All other systems reviewed and are  otherwise negative except as noted above.    Physical Exam    VS:  BP 130/80 (BP Location: Left Arm, Patient Position: Sitting, Cuff Size: Normal)   Pulse (!) 59   Ht 5\' 2"  (1.575 m)   Wt 128 lb (58.1 kg)   SpO2 96%   BMI 23.41 kg/m  , BMI Body mass index is 23.41 kg/m.     GEN: Well nourished, well developed, in no acute distress. HEENT: normal. Neck: Supple, no JVD, carotid bruits, or masses. Cardiac: IR, IR, no murmurs, rubs, or gallops. No clubbing, cyanosis, edema.  Radials 2+/PT 2+ and equal bilaterally.  Respiratory:  Respirations regular and unlabored, clear to auscultation bilaterally. GI: Soft, nontender, nondistended, BS + x 4. MS: no deformity or atrophy. Skin:  warm and dry, no rash. Neuro:  Strength and sensation are intact. Psych: Normal affect.  Accessory Clinical Findings    ECG personally reviewed by me today -atrial fibrillation, 59, nonspecific ST and T changes- no acute changes.  Lab Results  Component Value Date   WBC 8.3 03/20/2023   HGB 12.9 03/20/2023   HCT 41.1 03/20/2023   MCV 98.1 03/20/2023   PLT 310 03/20/2023   Lab Results  Component Value Date   CREATININE 0.71 02/13/2023   BUN 23 02/13/2023   NA 130 (L) 02/13/2023   K 5.3 (H) 02/13/2023   CL 98 02/13/2023   CO2 23 02/13/2023   Lab Results  Component Value Date   ALT 11 11/07/2022   AST 18 11/07/2022   ALKPHOS 36 (L) 11/07/2022   BILITOT 0.8 11/07/2022   Lab Results  Component Value Date   CHOL 166 11/07/2022   HDL 57 11/07/2022   LDLCALC 92 11/07/2022   TRIG 92 11/07/2022   CHOLHDL 3.6 08/12/2020    Assessment & Plan    1.  Persistent atrial fibrillation: Patient recently hospitalized following fall with acute fractures of the right anterior inferior glenoid and right femoral neck status post right hemiarthroplasty.  She was noted to be in atrial fibrillation on arrival.  This has been well rate controlled on beta-blocker therapy.  Echo during hospitalization showed normal  RV function with RVSP of 35 mmHg and moderate to severe TR.  She has been anticoagulated with Eliquis.  Heart rate is 59 today.  She has been asymptomatic at home.  Long discussion with patient and family today regarding options for management potentially to include rhythm control with cardioversion versus ongoing rate control.  At this time, patient wishes to pursue a rate control strategy with the plan to reevaluate how she is feeling in a month.  Refilling metoprolol and Eliquis today.  Follow-up CBC and basic metabolic panel.  Family indicated they may have some difficulty affording Eliquis going forward and will contact us if this ends up being the case, as she may require warfarin instead.  2.  Essential hypertension: Controlled on beta-blocker therapy.  3.  Status post right hip fracture: Starting physical therapy tonight.  Overall, recovering well and walking with a cane.  4.  Disposition: Follow-up CBC and basic metabolic panel.  Follow-up in clinic in 1 month or sooner if necessary.   Nicolasa Ducking, NP 03/20/2023, 12:22 PM

## 2023-03-20 NOTE — Patient Instructions (Signed)
Medication Instructions:  Your physician recommends that you continue on your current medications as directed. Please refer to the Current Medication list given to you today.  *If you need a refill on your cardiac medications before your next appointment, please call your pharmacy*   Lab Work: Your provider would like for you to have following labs drawn: CBC, BMP.   Please go to the [facility] entrance and check in at the front desk.  You do not need an appointment.  They are open from 7am-6 pm.   If you have labs (blood work) drawn today and your tests are completely normal, you will receive your results only by: MyChart Message (if you have MyChart) OR A paper copy in the mail If you have any lab test that is abnormal or we need to change your treatment, we will call you to review the results.   Testing/Procedures: none   Follow-Up: At [facility], you and your health needs are our priority.  As part of our continuing mission to provide you with exceptional heart care, we have created designated Provider Care Teams.  These Care Teams include your primary Cardiologist (physician) and Advanced Practice Providers (APPs -  Physician Assistants and Nurse Practitioners) who all work together to provide you with the care you need, when you need it.  We recommend signing up for the patient portal called "MyChart".  Sign up information is provided on this After Visit Summary.  MyChart is used to connect with patients for Virtual Visits (Telemedicine).  Patients are able to view lab/test results, encounter notes, upcoming appointments, etc.  Non-urgent messages can be sent to your provider as well.   To learn more about what you can do with MyChart, go to [email or web address].    Your next appointment:   1 month(s)  Provider:   Julien Nordmann, MD or Nicolasa Ducking, NP

## 2023-03-21 DIAGNOSIS — J84112 Idiopathic pulmonary fibrosis: Secondary | ICD-10-CM | POA: Diagnosis not present

## 2023-03-21 DIAGNOSIS — M80011D Age-related osteoporosis with current pathological fracture, right shoulder, subsequent encounter for fracture with routine healing: Secondary | ICD-10-CM | POA: Diagnosis not present

## 2023-03-21 DIAGNOSIS — M80051D Age-related osteoporosis with current pathological fracture, right femur, subsequent encounter for fracture with routine healing: Secondary | ICD-10-CM | POA: Diagnosis not present

## 2023-03-21 DIAGNOSIS — I4891 Unspecified atrial fibrillation: Secondary | ICD-10-CM | POA: Diagnosis not present

## 2023-03-21 DIAGNOSIS — I10 Essential (primary) hypertension: Secondary | ICD-10-CM | POA: Diagnosis not present

## 2023-03-21 DIAGNOSIS — J4489 Other specified chronic obstructive pulmonary disease: Secondary | ICD-10-CM | POA: Diagnosis not present

## 2023-03-25 DIAGNOSIS — S42151A Displaced fracture of neck of scapula, right shoulder, initial encounter for closed fracture: Secondary | ICD-10-CM | POA: Diagnosis not present

## 2023-03-25 DIAGNOSIS — M80051D Age-related osteoporosis with current pathological fracture, right femur, subsequent encounter for fracture with routine healing: Secondary | ICD-10-CM | POA: Diagnosis not present

## 2023-03-25 DIAGNOSIS — J84112 Idiopathic pulmonary fibrosis: Secondary | ICD-10-CM | POA: Diagnosis not present

## 2023-03-25 DIAGNOSIS — J4489 Other specified chronic obstructive pulmonary disease: Secondary | ICD-10-CM | POA: Diagnosis not present

## 2023-03-25 DIAGNOSIS — M80011D Age-related osteoporosis with current pathological fracture, right shoulder, subsequent encounter for fracture with routine healing: Secondary | ICD-10-CM | POA: Diagnosis not present

## 2023-03-25 DIAGNOSIS — I10 Essential (primary) hypertension: Secondary | ICD-10-CM | POA: Diagnosis not present

## 2023-03-25 DIAGNOSIS — S42141A Displaced fracture of glenoid cavity of scapula, right shoulder, initial encounter for closed fracture: Secondary | ICD-10-CM | POA: Diagnosis not present

## 2023-03-25 DIAGNOSIS — I4891 Unspecified atrial fibrillation: Secondary | ICD-10-CM | POA: Diagnosis not present

## 2023-03-25 DIAGNOSIS — Z96641 Presence of right artificial hip joint: Secondary | ICD-10-CM | POA: Diagnosis not present

## 2023-03-25 NOTE — Telephone Encounter (Signed)
Not sure if this was handled (was sent 2 weeks ago). Can we make sure she got the [facility] that she needed. If not, ok to place order.

## 2023-03-27 DIAGNOSIS — J84112 Idiopathic pulmonary fibrosis: Secondary | ICD-10-CM | POA: Diagnosis not present

## 2023-03-27 DIAGNOSIS — M80051D Age-related osteoporosis with current pathological fracture, right femur, subsequent encounter for fracture with routine healing: Secondary | ICD-10-CM | POA: Diagnosis not present

## 2023-03-27 DIAGNOSIS — I10 Essential (primary) hypertension: Secondary | ICD-10-CM | POA: Diagnosis not present

## 2023-03-27 DIAGNOSIS — I4891 Unspecified atrial fibrillation: Secondary | ICD-10-CM | POA: Diagnosis not present

## 2023-03-27 DIAGNOSIS — M80011D Age-related osteoporosis with current pathological fracture, right shoulder, subsequent encounter for fracture with routine healing: Secondary | ICD-10-CM | POA: Diagnosis not present

## 2023-03-27 DIAGNOSIS — J4489 Other specified chronic obstructive pulmonary disease: Secondary | ICD-10-CM | POA: Diagnosis not present

## 2023-03-29 NOTE — Progress Notes (Deleted)
      Established patient visit   Patient: Karen Henson   DOB: 18-Sep-1941   82 y.o. Female  MRN: [id number] Visit Date: 04/01/2023  Today's healthcare provider: Shirlee Latch, MD   No chief complaint on file.  Subjective    HPI  ***  Medications: Outpatient Medications Prior to Visit  Medication Sig   acetaminophen (TYLENOL) 500 MG tablet Take 500 mg by mouth every 6 (six) hours as needed for moderate pain or headache.   albuterol (VENTOLIN HFA) 108 (90 Base) MCG/ACT inhaler Inhale 2 puffs into the lungs every 6 (six) hours as needed for wheezing or shortness of breath. (Patient not taking: Reported on 02/09/2023)   apixaban (ELIQUIS) 2.5 MG TABS tablet Take 1 tablet (2.5 mg total) by mouth 2 (two) times daily.   docusate sodium (COLACE) 100 MG capsule Take 1 capsule (100 mg total) by mouth 2 (two) times daily.   metoprolol succinate (TOPROL-XL) 50 MG 24 hr tablet Take 1 tablet (50 mg total) by mouth at bedtime.   montelukast (SINGULAIR) 10 MG tablet TAKE 1 TABLET BY MOUTH BEDTIME FOR ASTHMA   ondansetron (ZOFRAN) 4 MG tablet Take 1 tablet (4 mg total) by mouth every 6 (six) hours as needed for nausea.   oxyCODONE (OXY IR/ROXICODONE) 5 MG immediate release tablet Take 0.5-1 tablets (2.5-5 mg total) by mouth every 4 (four) hours as needed for moderate pain (pain score 4-6).   polyethylene glycol (MIRALAX / GLYCOLAX) 17 g packet Take 17 g by mouth daily as needed for moderate constipation.   traMADol (ULTRAM) 50 MG tablet Take 1 tablet (50 mg total) by mouth every 6 (six) hours as needed for moderate pain.   No facility-administered medications prior to visit.    Review of Systems  {Labs  Heme  Chem  Endocrine  Serology  Results Review (optional):23779}   Objective    There were no vitals taken for this visit. {Show previous vital signs (optional):23777}  Physical Exam  ***  No results found for any visits on 04/01/23.  Assessment & Plan     ***  No  follow-ups on file.      {provider attestation***:1}   Shirlee Latch, MD  [facility] [phone] (phone) [phone] (fax)  [facility]

## 2023-04-01 ENCOUNTER — Ambulatory Visit: Payer: Self-pay

## 2023-04-01 ENCOUNTER — Ambulatory Visit: Payer: Medicare Other | Admitting: Family Medicine

## 2023-04-01 DIAGNOSIS — E782 Mixed hyperlipidemia: Secondary | ICD-10-CM

## 2023-04-01 DIAGNOSIS — I10 Essential (primary) hypertension: Secondary | ICD-10-CM

## 2023-04-01 NOTE — Telephone Encounter (Signed)
Summary: covid medication   Pt called checking on the prescription for Covid   the pharmacy told her granddaughter that it was not in stock but the note says she can't take paxlovid with eliquis,  Please advise  [phone]  ----- Message from Otis Brace sent at 04/01/2023  2:13 PM EDT ----- Pt called checking on the prescription for Covid   the pharmacy told her granddaughter that it was not in stock but the note says she can't take paxlovid with eliquis,  Please advise  [phone]

## 2023-04-01 NOTE — Telephone Encounter (Signed)
See message below. No med Rx'd as she cannot take paxlovid with Eliquis

## 2023-04-01 NOTE — Telephone Encounter (Signed)
This encounter was created in error - please disregard.

## 2023-04-01 NOTE — Telephone Encounter (Addendum)
  Called pt back. Gave pt with information from Dr. Beryle Flock.   Erasmo Downer, MD Physician [phone] PM    Unfortunately patient will not be a candidate for Paxlovid due to taking Eliquis.  There was another antiviral that we used before, but it is not used anymore due to lack of efficacy.     Pt will continue to use OTC medications for s/s. Pt will call back if s/s worsen.   Summary: covid medication   Pt called checking on the prescription for Covid   the pharmacy told her granddaughter that it was not in stock but the note says she can't take paxlovid with eliquis,  Please advise  [phone]  ----- Message from Otis Brace sent at 04/01/2023  2:13 PM EDT ----- Pt called checking on the prescription for Covid   the pharmacy told her granddaughter that it was not in stock but the note says she can't take paxlovid with eliquis,  Please advise  [phone]

## 2023-04-01 NOTE — Telephone Encounter (Signed)
Unfortunately patient will not be a candidate for Paxlovid due to taking Eliquis.  There was another antiviral that we used before, but it is not used anymore due to lack of efficacy.

## 2023-04-01 NOTE — Telephone Encounter (Signed)
   Chief Complaint: COVID + Symptoms: HA, Runny nose cough, no appetite Frequency: Yesterday Pertinent Negatives: Patient denies  Disposition: [] ED /[] Urgent Care (no appt availability in office) / [] Appointment(In office/virtual)/ []  [facility]/ [] Home Care/ [x] Refused Recommended Disposition /[] [facility]/ []  Follow-up with PCP Additional Notes: Pt recently broke her hip and has mobility issues. Daughter who would be her driver also has COVID.  Both were exposed to COVID with grand daughter.    Pt cancelled in office appt d/t COVID and transportation issues.  Pt does not have a smart phone or computer for VV.  Pt is interested in Anti-virals. If provider will prescribe anti-virals they should be called into [facility].  Please advise.     Reason for Disposition  [1] HIGH RISK patient (e.g., weak immune system, age > 64 years, obesity with BMI 30 or higher, pregnant, chronic lung disease or other chronic medical condition) AND [2] COVID symptoms (e.g., cough, fever)  (Exceptions: Already seen by PCP and no new or worsening symptoms.)  Answer Assessment - Initial Assessment Questions 1. COVID-19 DIAGNOSIS: "How do you know that you have COVID?" (e.g., positive lab test or self-test, diagnosed by doctor or NP/PA, symptoms after exposure).     At home test yesterday 2. COVID-19 EXPOSURE: "Was there any known exposure to COVID before the symptoms began?" CDC Definition of close contact: within 6 feet (2 meters) for a total of 15 minutes or more over a 24-hour period.      Yes - granddaughter 3. ONSET: "When did the COVID-19 symptoms start?"      Yesterday 4. WORST SYMPTOM: "What is your worst symptom?" (e.g., cough, fever, shortness of breath, muscle aches)     HA, runny nose, cough, no appetite 5. COUGH: "Do you have a cough?" If Yes, ask: "How bad is the cough?"       yes 6. FEVER: "Do you have a fever?" If Yes, ask: "What is your temperature, how was it  measured, and when did it start?"     fever 10. HIGH RISK DISEASE: "Do you have any chronic medical problems?" (e.g., asthma, heart or lung disease, weak immune system, obesity, etc.)       HTN  Protocols used: Coronavirus (COVID-19) Diagnosed or Suspected-A-AH

## 2023-04-01 NOTE — Telephone Encounter (Signed)
NA. Mail box is full 

## 2023-04-03 ENCOUNTER — Telehealth: Payer: Self-pay

## 2023-04-03 NOTE — Telephone Encounter (Signed)
Copied from CRM [id number]. Topic: Quick Communication - Home Health Verbal Orders >> Apr 03, 2023  3:34 PM Everette C wrote: Caller/Agency: Noreene Larsson / [facility]   Has called to report a missed PT visit for today 04/03/23  The patient will be seen next week  Please contact further if needed

## 2023-04-05 NOTE — Telephone Encounter (Signed)
Noted  

## 2023-04-09 ENCOUNTER — Telehealth: Payer: Self-pay

## 2023-04-09 DIAGNOSIS — J4489 Other specified chronic obstructive pulmonary disease: Secondary | ICD-10-CM | POA: Diagnosis not present

## 2023-04-09 DIAGNOSIS — I10 Essential (primary) hypertension: Secondary | ICD-10-CM | POA: Diagnosis not present

## 2023-04-09 DIAGNOSIS — I4891 Unspecified atrial fibrillation: Secondary | ICD-10-CM | POA: Diagnosis not present

## 2023-04-09 DIAGNOSIS — J84112 Idiopathic pulmonary fibrosis: Secondary | ICD-10-CM | POA: Diagnosis not present

## 2023-04-09 DIAGNOSIS — M80051D Age-related osteoporosis with current pathological fracture, right femur, subsequent encounter for fracture with routine healing: Secondary | ICD-10-CM | POA: Diagnosis not present

## 2023-04-09 DIAGNOSIS — M80011D Age-related osteoporosis with current pathological fracture, right shoulder, subsequent encounter for fracture with routine healing: Secondary | ICD-10-CM | POA: Diagnosis not present

## 2023-04-09 NOTE — Telephone Encounter (Signed)
Copied from CRM [id number]. Topic: General - Other >> Apr 09, 2023  2:47 PM Santiya F wrote: Reason for CRM: Darolyn Rua, an OT with [facility] is calling requesting a protocol for pt's right shoulder. Please follow up with Shawna Orleans, [phone]

## 2023-04-10 ENCOUNTER — Other Ambulatory Visit: Payer: Self-pay | Admitting: Nurse Practitioner

## 2023-04-11 NOTE — Telephone Encounter (Signed)
Called Melanie back. She got protocol from Ortho

## 2023-04-14 DIAGNOSIS — M1990 Unspecified osteoarthritis, unspecified site: Secondary | ICD-10-CM | POA: Diagnosis not present

## 2023-04-14 DIAGNOSIS — Z87891 Personal history of nicotine dependence: Secondary | ICD-10-CM | POA: Diagnosis not present

## 2023-04-14 DIAGNOSIS — J4489 Other specified chronic obstructive pulmonary disease: Secondary | ICD-10-CM | POA: Diagnosis not present

## 2023-04-14 DIAGNOSIS — Z9181 History of falling: Secondary | ICD-10-CM | POA: Diagnosis not present

## 2023-04-14 DIAGNOSIS — M80051D Age-related osteoporosis with current pathological fracture, right femur, subsequent encounter for fracture with routine healing: Secondary | ICD-10-CM | POA: Diagnosis not present

## 2023-04-14 DIAGNOSIS — M80011D Age-related osteoporosis with current pathological fracture, right shoulder, subsequent encounter for fracture with routine healing: Secondary | ICD-10-CM | POA: Diagnosis not present

## 2023-04-14 DIAGNOSIS — K5900 Constipation, unspecified: Secondary | ICD-10-CM | POA: Diagnosis not present

## 2023-04-14 DIAGNOSIS — J84112 Idiopathic pulmonary fibrosis: Secondary | ICD-10-CM | POA: Diagnosis not present

## 2023-04-14 DIAGNOSIS — Z7901 Long term (current) use of anticoagulants: Secondary | ICD-10-CM | POA: Diagnosis not present

## 2023-04-14 DIAGNOSIS — I4891 Unspecified atrial fibrillation: Secondary | ICD-10-CM | POA: Diagnosis not present

## 2023-04-14 DIAGNOSIS — I10 Essential (primary) hypertension: Secondary | ICD-10-CM | POA: Diagnosis not present

## 2023-04-14 DIAGNOSIS — Z96641 Presence of right artificial hip joint: Secondary | ICD-10-CM | POA: Diagnosis not present

## 2023-04-14 NOTE — Progress Notes (Signed)
Cardiology Office Note  Date:  04/15/2023   ID:  LASHANTI Henson, DOB 11/07/1940, MRN [id number]  PCP:  Erasmo Downer, MD   Chief Complaint  Patient presents with   1 month follow up     "Doing well." Medications reviewed by the patient verbally.     HPI:  82 y.o. y/o female with a history of  hypertension,  COPD, idiopathic pulmonary fibrosis,  who presents for follow-up after recent admission 5/24  for right femoral neck fracture and right anterior inferior glenoid fracture complicated by new onset atrial fibrillation.  Who presents for follow-up in clinic for atrial fibrillation  recently hospitalized following fall with acute fractures of the right anterior inferior glenoid and right femoral neck status post right hemiarthroplasty. She was noted to be in atrial fibrillation on arrival.   Echo during hospitalization showed normal LV function with RVSP of 35 mmHg and moderate to severe TR.  anticoagulated with Eliquis.  2.5 twice daily reduced dose given age and weight  Seen in clinic by one of our providers March 20, 2023 Remained in atrial fibrillation  Presents with family today Doing PT and OT, still has not made a full recovery, slow, needing assistance for mobility Not driving yet Reports she is asymptomatic from her atrial fibrillation Denies significant shortness of breath on exertion, no leg swelling, no PND orthopnea  EKG personally reviewed by myself on todays visit EKG Interpretation Date/Time:  Monday April 15 2023 09:58:14 EDT Ventricular Rate:  59 PR Interval:    QRS Duration:  94 QT Interval:  440 QTC Calculation: 435 R Axis:   -32  Text Interpretation: Atrial fibrillation with slow ventricular response Left axis deviation Moderate voltage criteria for LVH, may be normal variant ( R in aVL , Cornell product ) ST & T wave abnormality, consider inferolateral ischemia When compared with ECG of 09-Feb-2023 15:26, PREVIOUS ECG IS PRESENT Confirmed by  Julien Nordmann [phone]) on 04/15/2023 10:05:30 AM     PMH:   has a past medical history of Allergy, Arthritis, Bronchitis, chronic (HCC), COPD (chronic obstructive pulmonary disease) (HCC), Cough, Dyspnea, Hypertension, 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HCC), Osteoporosis, Persistent atrial fibrillation (HCC), and Tricuspid regurgitation.  PSH:    Past Surgical History:  Procedure Laterality Date   BREAST EXCISIONAL BIOPSY Left 1996   neg   BREAST SURGERY     CAPSULOTOMY     CATARACT EXTRACTION W/PHACO Left 10/31/2017   Procedure: CATARACT EXTRACTION PHACO AND INTRAOCULAR LENS PLACEMENT (IOC);  Surgeon: Nevada Crane, MD;  Location: [facility] ORS;  Service: Ophthalmology;  Laterality: Left;  [place] 00:43.7 AP% 19.6 CDE 8.57 FLUID PACK LOT # [id number]   CATARACT EXTRACTION W/PHACO Right 12/05/2017   Procedure: CATARACT EXTRACTION PHACO AND INTRAOCULAR LENS PLACEMENT (IOC);  Surgeon: Nevada Crane, MD;  Location: [facility] ORS;  Service: Ophthalmology;  Laterality: Right;  fluid pack lot #[id number]  exp 07/01/2019 [place]    00:31.4 AP%    8.8 CDE    2.79   COLONOSCOPY  02/15/2006   COLONOSCOPY WITH PROPOFOL N/A 03/21/2016   Procedure: COLONOSCOPY WITH PROPOFOL;  Surgeon: Earline Mayotte, MD;  Location: [facility] ENDOSCOPY;  Service: Endoscopy;  Laterality: N/A;   COLONOSCOPY WITH PROPOFOL N/A 03/29/2021   Procedure: COLONOSCOPY WITH PROPOFOL;  Surgeon: Earline Mayotte, MD;  Location: [facility] ENDOSCOPY;  Service: Endoscopy;  Laterality: N/A;   DILATION AND CURETTAGE OF UTERUS     EYE SURGERY     HIP ARTHROPLASTY Right 02/10/2023   Procedure: ARTHROPLASTY BIPOLAR  HIP (HEMIARTHROPLASTY);  Surgeon: Signa Kell, MD;  Location: [facility] ORS;  Service: Orthopedics;  Laterality: Right;   VARICOSE VEIN SURGERY      Current Outpatient Medications  Medication Sig Dispense Refill   acetaminophen (TYLENOL) 500 MG tablet Take 500 mg by mouth every 6 (six) hours as needed for moderate pain or headache.     albuterol (VENTOLIN HFA)  108 (90 Base) MCG/ACT inhaler Inhale 2 puffs into the lungs every 6 (six) hours as needed for wheezing or shortness of breath.     apixaban (ELIQUIS) 2.5 MG TABS tablet Take 1 tablet (2.5 mg total) by mouth 2 (two) times daily. 180 tablet 3   docusate sodium (COLACE) 100 MG capsule Take 1 capsule (100 mg total) by mouth 2 (two) times daily. 10 capsule 0   metoprolol succinate (TOPROL-XL) 50 MG 24 hr tablet Take 1 tablet (50 mg total) by mouth at bedtime. 90 tablet 3   montelukast (SINGULAIR) 10 MG tablet TAKE 1 TABLET BY MOUTH BEDTIME FOR ASTHMA 90 tablet 1   ondansetron (ZOFRAN) 4 MG tablet Take 1 tablet (4 mg total) by mouth every 6 (six) hours as needed for nausea. 20 tablet 0   oxyCODONE (OXY IR/ROXICODONE) 5 MG immediate release tablet Take 0.5-1 tablets (2.5-5 mg total) by mouth every 4 (four) hours as needed for moderate pain (pain score 4-6). 30 tablet 0   polyethylene glycol (MIRALAX / GLYCOLAX) 17 g packet Take 17 g by mouth daily as needed for moderate constipation. 14 each 0   traMADol (ULTRAM) 50 MG tablet Take 1 tablet (50 mg total) by mouth every 6 (six) hours as needed for moderate pain. 30 tablet 0   No current facility-administered medications for this visit.     Allergies:   Lisinopril   Social History:  The patient  reports that she has quit smoking. Her smoking use included cigarettes. She has never used smokeless tobacco. She reports current alcohol use. She reports that she does not use drugs.   Family History:   family history includes Diabetes in her maternal aunt and maternal grandmother; Emphysema in her father; Hypertension in her mother; Lung cancer in her grandchild; Stroke in her brother and sister.    Review of Systems: Review of Systems  Constitutional: Negative.   HENT: Negative.    Respiratory: Negative.    Cardiovascular: Negative.   Gastrointestinal: Negative.   Musculoskeletal: Negative.   Neurological: Negative.   Psychiatric/Behavioral: Negative.     All other systems reviewed and are negative.   PHYSICAL EXAM: VS:  BP (!) 130/90 (BP Location: Left Arm, Patient Position: Sitting, Cuff Size: Normal)   Pulse (!) 59   Ht 5\' 2"  (1.575 m)   Wt 123 lb (55.8 kg)   SpO2 97%   BMI 22.50 kg/m  , BMI Body mass index is 22.5 kg/m. GEN: Well nourished, well developed, in no acute distress HEENT: normal Neck: no JVD, carotid bruits, or masses Cardiac: RRR; no murmurs, rubs, or gallops,no edema  Respiratory:  clear to auscultation bilaterally, normal work of breathing GI: soft, nontender, nondistended, + BS MS: no deformity or atrophy Skin: warm and dry, no rash Neuro:  Strength and sensation are intact Psych: euthymic mood, full affect   Recent Labs: 11/07/2022: ALT 11 02/10/2023: Magnesium 2.1 03/20/2023: BUN 11; Creatinine, Ser 0.66; Hemoglobin 12.9; Platelets 310; Potassium 4.2; Sodium 135    Lipid Panel Lab Results  Component Value Date   CHOL 166 11/07/2022   HDL 57 11/07/2022  LDLCALC 92 11/07/2022   TRIG 92 11/07/2022      Wt Readings from Last 3 Encounters:  04/15/23 123 lb (55.8 kg)  03/20/23 128 lb (58.1 kg)  03/14/23 120 lb (54.4 kg)     ASSESSMENT AND PLAN:  Problem List Items Addressed This Visit       Cardiology Problems   Essential hypertension   Relevant Orders   EKG 12-Lead (Completed)   Moderate mixed hyperlipidemia not requiring statin therapy     Other   Displaced fracture of glenoid cavity of right scapula with routine healing   Fibrosis lung (HCC)   COPD (chronic obstructive pulmonary disease) (HCC)   Other Visit Diagnoses     Persistent atrial fibrillation (HCC)    -  Primary   Relevant Orders   EKG 12-Lead (Completed)      Persistent atrial fibrillation Rate controlled on metoprolol, tolerating Eliquis 2.5 twice daily Long discussion concerning treatment options for atrial fibrillation including cardioversion, medical management with rate control, consideration of antiarrhythmics  with cardioversion At this time prefers rate control Family will monitor her symptoms as she recovers and get stronger and more active and if she develops worsening shortness of breath, fatigue, palpitation symptoms, cardioversion could be performed  Abnormal EKG Denies anginal symptoms, no shortness of breath, no chest pain symptoms ST-T wave abnormality V3 through V6, more pronounced compared to prior EKG June 2024 Recent echocardiogram with no wall motion abnormality, EF 55 to 60% Low risk features for ischemia non-smoker, no diabetes, cholesterol relatively well-controlled Will hold off on ischemic workup at this time  COPD/pulmonary fibrosis On room air, reports breathing stable   Total encounter time more than 40 minutes  Greater than 50% was spent in counseling and coordination of care with the patient    Signed, Dossie Arbour, M.D., Ph.D. [facility] [place], [place] [phone]

## 2023-04-15 ENCOUNTER — Encounter: Payer: Self-pay | Admitting: Cardiovascular Disease

## 2023-04-15 ENCOUNTER — Ambulatory Visit: Payer: Medicare Other | Attending: Cardiovascular Disease | Admitting: Cardiovascular Disease

## 2023-04-15 VITALS — BP 130/90 | HR 59 | Ht 62.0 in | Wt 123.0 lb

## 2023-04-15 DIAGNOSIS — I4891 Unspecified atrial fibrillation: Secondary | ICD-10-CM | POA: Diagnosis not present

## 2023-04-15 DIAGNOSIS — J4489 Other specified chronic obstructive pulmonary disease: Secondary | ICD-10-CM | POA: Diagnosis not present

## 2023-04-15 DIAGNOSIS — E782 Mixed hyperlipidemia: Secondary | ICD-10-CM | POA: Diagnosis not present

## 2023-04-15 DIAGNOSIS — I4819 Other persistent atrial fibrillation: Secondary | ICD-10-CM | POA: Diagnosis not present

## 2023-04-15 DIAGNOSIS — J8410 Pulmonary fibrosis, unspecified: Secondary | ICD-10-CM | POA: Insufficient documentation

## 2023-04-15 DIAGNOSIS — I10 Essential (primary) hypertension: Secondary | ICD-10-CM | POA: Diagnosis not present

## 2023-04-15 DIAGNOSIS — S42141D Displaced fracture of glenoid cavity of scapula, right shoulder, subsequent encounter for fracture with routine healing: Secondary | ICD-10-CM | POA: Diagnosis not present

## 2023-04-15 DIAGNOSIS — J449 Chronic obstructive pulmonary disease, unspecified: Secondary | ICD-10-CM | POA: Diagnosis not present

## 2023-04-15 DIAGNOSIS — M80011D Age-related osteoporosis with current pathological fracture, right shoulder, subsequent encounter for fracture with routine healing: Secondary | ICD-10-CM | POA: Diagnosis not present

## 2023-04-15 DIAGNOSIS — M80051D Age-related osteoporosis with current pathological fracture, right femur, subsequent encounter for fracture with routine healing: Secondary | ICD-10-CM | POA: Diagnosis not present

## 2023-04-15 DIAGNOSIS — J84112 Idiopathic pulmonary fibrosis: Secondary | ICD-10-CM | POA: Diagnosis not present

## 2023-04-15 NOTE — Patient Instructions (Signed)
 Medication Instructions:  No changes  If you need a refill on your cardiac medications before your next appointment, please call your pharmacy.    Lab work: No new labs needed   Testing/Procedures: No new testing needed   Follow-Up: At [facility], you and your health needs are our priority.  As part of our continuing mission to provide you with exceptional heart care, we have created designated Provider Care Teams.  These Care Teams include your primary Cardiologist (physician) and Advanced Practice Providers (APPs -  Physician Assistants and Nurse Practitioners) who all work together to provide you with the care you need, when you need it.  You will need a follow up appointment in 6 months  Providers on your designated Care Team:   Christopher Berge, NP Ryan Dunn, PA-C Cadence Furth, PA-C  COVID-19 Vaccine Information can be found at: [email or web address] For questions related to vaccine distribution or appointments, please email [email or web address] or call [phone].   

## 2023-04-16 DIAGNOSIS — M80051D Age-related osteoporosis with current pathological fracture, right femur, subsequent encounter for fracture with routine healing: Secondary | ICD-10-CM | POA: Diagnosis not present

## 2023-04-16 DIAGNOSIS — M80011D Age-related osteoporosis with current pathological fracture, right shoulder, subsequent encounter for fracture with routine healing: Secondary | ICD-10-CM | POA: Diagnosis not present

## 2023-04-16 DIAGNOSIS — I4891 Unspecified atrial fibrillation: Secondary | ICD-10-CM | POA: Diagnosis not present

## 2023-04-16 DIAGNOSIS — J4489 Other specified chronic obstructive pulmonary disease: Secondary | ICD-10-CM | POA: Diagnosis not present

## 2023-04-16 DIAGNOSIS — J84112 Idiopathic pulmonary fibrosis: Secondary | ICD-10-CM | POA: Diagnosis not present

## 2023-04-16 DIAGNOSIS — I10 Essential (primary) hypertension: Secondary | ICD-10-CM | POA: Diagnosis not present

## 2023-04-22 DIAGNOSIS — J4489 Other specified chronic obstructive pulmonary disease: Secondary | ICD-10-CM | POA: Diagnosis not present

## 2023-04-22 DIAGNOSIS — M80011D Age-related osteoporosis with current pathological fracture, right shoulder, subsequent encounter for fracture with routine healing: Secondary | ICD-10-CM | POA: Diagnosis not present

## 2023-04-22 DIAGNOSIS — K5900 Constipation, unspecified: Secondary | ICD-10-CM | POA: Diagnosis not present

## 2023-04-22 DIAGNOSIS — Z7901 Long term (current) use of anticoagulants: Secondary | ICD-10-CM | POA: Diagnosis not present

## 2023-04-22 DIAGNOSIS — M1990 Unspecified osteoarthritis, unspecified site: Secondary | ICD-10-CM | POA: Diagnosis not present

## 2023-04-22 DIAGNOSIS — J84112 Idiopathic pulmonary fibrosis: Secondary | ICD-10-CM | POA: Diagnosis not present

## 2023-04-22 DIAGNOSIS — I10 Essential (primary) hypertension: Secondary | ICD-10-CM | POA: Diagnosis not present

## 2023-04-22 DIAGNOSIS — Z9181 History of falling: Secondary | ICD-10-CM | POA: Diagnosis not present

## 2023-04-22 DIAGNOSIS — Z87891 Personal history of nicotine dependence: Secondary | ICD-10-CM | POA: Diagnosis not present

## 2023-04-22 DIAGNOSIS — Z96641 Presence of right artificial hip joint: Secondary | ICD-10-CM | POA: Diagnosis not present

## 2023-04-22 DIAGNOSIS — M80051D Age-related osteoporosis with current pathological fracture, right femur, subsequent encounter for fracture with routine healing: Secondary | ICD-10-CM | POA: Diagnosis not present

## 2023-04-22 DIAGNOSIS — I4891 Unspecified atrial fibrillation: Secondary | ICD-10-CM | POA: Diagnosis not present

## 2023-04-29 ENCOUNTER — Ambulatory Visit: Payer: Medicare Other | Admitting: Family Medicine

## 2023-04-29 ENCOUNTER — Ambulatory Visit (INDEPENDENT_AMBULATORY_CARE_PROVIDER_SITE_OTHER): Payer: Medicare Other | Admitting: Family Medicine

## 2023-04-29 ENCOUNTER — Encounter: Payer: Self-pay | Admitting: Family Medicine

## 2023-04-29 VITALS — BP 130/85 | HR 69 | Temp 98.2°F | Resp 12 | Ht 62.0 in | Wt 124.3 lb

## 2023-04-29 DIAGNOSIS — J449 Chronic obstructive pulmonary disease, unspecified: Secondary | ICD-10-CM

## 2023-04-29 DIAGNOSIS — I10 Essential (primary) hypertension: Secondary | ICD-10-CM | POA: Diagnosis not present

## 2023-04-29 DIAGNOSIS — E782 Mixed hyperlipidemia: Secondary | ICD-10-CM

## 2023-04-29 DIAGNOSIS — S72001A Fracture of unspecified part of neck of right femur, initial encounter for closed fracture: Secondary | ICD-10-CM | POA: Diagnosis not present

## 2023-04-29 DIAGNOSIS — S42141D Displaced fracture of glenoid cavity of scapula, right shoulder, subsequent encounter for fracture with routine healing: Secondary | ICD-10-CM

## 2023-04-29 NOTE — Progress Notes (Signed)
Established Patient Office Visit  Subjective   Patient ID: Karen Henson, female    DOB: Apr 08, 1941  Age: 82 y.o. MRN: [id number]  Chief Complaint  Patient presents with   Hypertension    Hypertension    Discussed the use of AI scribe software for clinical note transcription with the patient, who gave verbal consent to proceed.  History of Present Illness   The patient, with a history of fall resulting in a hip fracture and a shoulder fracture in 6/24, presents for follow-up. She reports dissatisfaction with the current physical therapy company, [facility], due to irregular visits and poor communication. The patient's shoulder is not improving as expected and she expresses a need for more focused therapy on this area. The patient's hip, however, is reported to be doing well, although she can tell there is something different. She reports no pain unless she moves in certain ways. The patient's home has been set up for fall prevention, including a ramp and a bathroom set up for showering. The patient is due for a follow-up with orthopedics in August.         ROS per HPI    Objective:     BP 130/85 Comment: home readings  Pulse 69   Temp 98.2 F (36.8 C) (Temporal)   Resp 12   Ht 5\' 2"  (1.575 m)   Wt 124 lb 4.8 oz (56.4 kg)   SpO2 94%   BMI 22.73 kg/m    Physical Exam Vitals reviewed.  Constitutional:      General: She is not in acute distress.    Appearance: Normal appearance. She is well-developed. She is not diaphoretic.  HENT:     Head: Normocephalic and atraumatic.  Eyes:     General: No scleral icterus.    Conjunctiva/sclera: Conjunctivae normal.  Neck:     Thyroid: No thyromegaly.  Cardiovascular:     Rate and Rhythm: Normal rate and regular rhythm.     Heart sounds: Normal heart sounds. No murmur heard. Pulmonary:     Effort: Pulmonary effort is normal. No respiratory distress.     Breath sounds: Normal breath sounds. No wheezing, rhonchi or rales.   Musculoskeletal:     Cervical back: Neck supple.     Right lower leg: No edema.     Left lower leg: No edema.  Lymphadenopathy:     Cervical: No cervical adenopathy.  Skin:    General: Skin is warm and dry.     Findings: No rash.  Neurological:     Mental Status: She is alert and oriented to person, place, and time. Mental status is at baseline.  Psychiatric:        Mood and Affect: Mood normal.        Behavior: Behavior normal.     No results found for any visits on 04/29/23.    The ASCVD Risk score (Arnett DK, et al., 2019) failed to calculate for the following reasons:   The 2019 ASCVD risk score is only valid for ages 22 to 70    Assessment & Plan:   Problem List Items Addressed This Visit       Cardiovascular and Mediastinum   Essential hypertension - Primary     Respiratory   COPD (chronic obstructive pulmonary disease) (HCC)     Musculoskeletal and Integument   Closed right hip fracture (HCC)   Relevant Orders   Ambulatory referral to Home Health   Displaced fracture of glenoid cavity of right scapula  with routine healing   Relevant Orders   Ambulatory referral to Home Health     Other   Moderate mixed hyperlipidemia not requiring statin therapy       Right Shoulder Fracture: Limited range of motion and inadequate physical therapy. No pain at rest, only with certain movements. -Change physical therapy company for [facility] for better compliance and communication. -Continue home exercises as instructed by previous physical therapy.  Right Hip Fracture: Healing well with no pain. Noted feeling of something different in the hip. -Continue home exercises as instructed by previous physical therapy. -Start Calcium and Vitamin D supplementation for bone healing.  Atrial Fibrillation: No changes in management. Follow-up with cardiologist at the end of summer. -Continue current management.  Hypertension: Elevated blood pressure in the office, but reported normal  readings at home. -Continue current management. -Advise patient to monitor blood pressure at home and report if readings start to increase.  General Health Maintenance / Followup Plans -Schedule wellness visit for December 2024 or January 2025. -No labs needed today.        Return in about 6 months (around 10/30/2023) for AWV.    Shirlee Latch, MD

## 2023-04-30 DIAGNOSIS — I10 Essential (primary) hypertension: Secondary | ICD-10-CM | POA: Diagnosis not present

## 2023-04-30 DIAGNOSIS — I4891 Unspecified atrial fibrillation: Secondary | ICD-10-CM | POA: Diagnosis not present

## 2023-04-30 DIAGNOSIS — M80011D Age-related osteoporosis with current pathological fracture, right shoulder, subsequent encounter for fracture with routine healing: Secondary | ICD-10-CM | POA: Diagnosis not present

## 2023-04-30 DIAGNOSIS — J84112 Idiopathic pulmonary fibrosis: Secondary | ICD-10-CM | POA: Diagnosis not present

## 2023-04-30 DIAGNOSIS — J4489 Other specified chronic obstructive pulmonary disease: Secondary | ICD-10-CM | POA: Diagnosis not present

## 2023-04-30 DIAGNOSIS — M80051D Age-related osteoporosis with current pathological fracture, right femur, subsequent encounter for fracture with routine healing: Secondary | ICD-10-CM | POA: Diagnosis not present

## 2023-05-06 ENCOUNTER — Telehealth: Payer: Self-pay | Admitting: Family Medicine

## 2023-05-06 DIAGNOSIS — S72001D Fracture of unspecified part of neck of right femur, subsequent encounter for closed fracture with routine healing: Secondary | ICD-10-CM | POA: Diagnosis not present

## 2023-05-06 DIAGNOSIS — Z7901 Long term (current) use of anticoagulants: Secondary | ICD-10-CM | POA: Diagnosis not present

## 2023-05-06 DIAGNOSIS — J449 Chronic obstructive pulmonary disease, unspecified: Secondary | ICD-10-CM | POA: Diagnosis not present

## 2023-05-06 DIAGNOSIS — E782 Mixed hyperlipidemia: Secondary | ICD-10-CM | POA: Diagnosis not present

## 2023-05-06 DIAGNOSIS — I4891 Unspecified atrial fibrillation: Secondary | ICD-10-CM | POA: Diagnosis not present

## 2023-05-06 DIAGNOSIS — Z9181 History of falling: Secondary | ICD-10-CM | POA: Diagnosis not present

## 2023-05-06 DIAGNOSIS — I10 Essential (primary) hypertension: Secondary | ICD-10-CM | POA: Diagnosis not present

## 2023-05-06 DIAGNOSIS — S42141D Displaced fracture of glenoid cavity of scapula, right shoulder, subsequent encounter for fracture with routine healing: Secondary | ICD-10-CM | POA: Diagnosis not present

## 2023-05-06 NOTE — Telephone Encounter (Signed)
OK for verbals

## 2023-05-06 NOTE — Telephone Encounter (Signed)
Home Health Verbal Orders - Caller/Agency: [name] with [facility]  Callback Number: [phone]  Requesting PT  Frequency: 1 week 9

## 2023-05-07 NOTE — Telephone Encounter (Signed)
[name] advised of approved orders.

## 2023-05-14 DIAGNOSIS — J449 Chronic obstructive pulmonary disease, unspecified: Secondary | ICD-10-CM | POA: Diagnosis not present

## 2023-05-14 DIAGNOSIS — S72001D Fracture of unspecified part of neck of right femur, subsequent encounter for closed fracture with routine healing: Secondary | ICD-10-CM | POA: Diagnosis not present

## 2023-05-14 DIAGNOSIS — E782 Mixed hyperlipidemia: Secondary | ICD-10-CM | POA: Diagnosis not present

## 2023-05-14 DIAGNOSIS — S42141D Displaced fracture of glenoid cavity of scapula, right shoulder, subsequent encounter for fracture with routine healing: Secondary | ICD-10-CM | POA: Diagnosis not present

## 2023-05-14 DIAGNOSIS — I4891 Unspecified atrial fibrillation: Secondary | ICD-10-CM | POA: Diagnosis not present

## 2023-05-14 DIAGNOSIS — I10 Essential (primary) hypertension: Secondary | ICD-10-CM | POA: Diagnosis not present

## 2023-05-15 DIAGNOSIS — J449 Chronic obstructive pulmonary disease, unspecified: Secondary | ICD-10-CM | POA: Diagnosis not present

## 2023-05-15 DIAGNOSIS — I4891 Unspecified atrial fibrillation: Secondary | ICD-10-CM | POA: Diagnosis not present

## 2023-05-15 DIAGNOSIS — E782 Mixed hyperlipidemia: Secondary | ICD-10-CM | POA: Diagnosis not present

## 2023-05-15 DIAGNOSIS — S42141D Displaced fracture of glenoid cavity of scapula, right shoulder, subsequent encounter for fracture with routine healing: Secondary | ICD-10-CM | POA: Diagnosis not present

## 2023-05-15 DIAGNOSIS — I10 Essential (primary) hypertension: Secondary | ICD-10-CM | POA: Diagnosis not present

## 2023-05-15 DIAGNOSIS — S72001D Fracture of unspecified part of neck of right femur, subsequent encounter for closed fracture with routine healing: Secondary | ICD-10-CM | POA: Diagnosis not present

## 2023-05-17 ENCOUNTER — Telehealth: Payer: Self-pay | Admitting: Family Medicine

## 2023-05-17 NOTE — Telephone Encounter (Signed)
Home Health Verbal Orders - Caller/Agency: Junious Dresser with [facility] Callback Number: [phone]  Requesting OT Frequency: 2 x a wk for 2 wks, 1 x a wk for 4 wks  Please assist patient further

## 2023-05-20 NOTE — Telephone Encounter (Signed)
Left detailed message for Junious Dresser advising of approved orders.

## 2023-05-20 NOTE — Telephone Encounter (Signed)
OK for verbals

## 2023-05-21 DIAGNOSIS — S42141D Displaced fracture of glenoid cavity of scapula, right shoulder, subsequent encounter for fracture with routine healing: Secondary | ICD-10-CM | POA: Diagnosis not present

## 2023-05-21 DIAGNOSIS — S72001D Fracture of unspecified part of neck of right femur, subsequent encounter for closed fracture with routine healing: Secondary | ICD-10-CM | POA: Diagnosis not present

## 2023-05-21 DIAGNOSIS — J449 Chronic obstructive pulmonary disease, unspecified: Secondary | ICD-10-CM | POA: Diagnosis not present

## 2023-05-21 DIAGNOSIS — E782 Mixed hyperlipidemia: Secondary | ICD-10-CM | POA: Diagnosis not present

## 2023-05-21 DIAGNOSIS — I10 Essential (primary) hypertension: Secondary | ICD-10-CM | POA: Diagnosis not present

## 2023-05-21 DIAGNOSIS — I4891 Unspecified atrial fibrillation: Secondary | ICD-10-CM | POA: Diagnosis not present

## 2023-05-23 DIAGNOSIS — S42141D Displaced fracture of glenoid cavity of scapula, right shoulder, subsequent encounter for fracture with routine healing: Secondary | ICD-10-CM | POA: Diagnosis not present

## 2023-05-23 DIAGNOSIS — S72001D Fracture of unspecified part of neck of right femur, subsequent encounter for closed fracture with routine healing: Secondary | ICD-10-CM | POA: Diagnosis not present

## 2023-05-23 DIAGNOSIS — I4891 Unspecified atrial fibrillation: Secondary | ICD-10-CM | POA: Diagnosis not present

## 2023-05-23 DIAGNOSIS — I10 Essential (primary) hypertension: Secondary | ICD-10-CM | POA: Diagnosis not present

## 2023-05-23 DIAGNOSIS — E782 Mixed hyperlipidemia: Secondary | ICD-10-CM | POA: Diagnosis not present

## 2023-05-23 DIAGNOSIS — J449 Chronic obstructive pulmonary disease, unspecified: Secondary | ICD-10-CM | POA: Diagnosis not present

## 2023-05-27 DIAGNOSIS — S42141A Displaced fracture of glenoid cavity of scapula, right shoulder, initial encounter for closed fracture: Secondary | ICD-10-CM | POA: Diagnosis not present

## 2023-05-27 DIAGNOSIS — S42151A Displaced fracture of neck of scapula, right shoulder, initial encounter for closed fracture: Secondary | ICD-10-CM | POA: Diagnosis not present

## 2023-05-27 DIAGNOSIS — Z96641 Presence of right artificial hip joint: Secondary | ICD-10-CM | POA: Diagnosis not present

## 2023-05-28 DIAGNOSIS — I10 Essential (primary) hypertension: Secondary | ICD-10-CM | POA: Diagnosis not present

## 2023-05-28 DIAGNOSIS — S72001D Fracture of unspecified part of neck of right femur, subsequent encounter for closed fracture with routine healing: Secondary | ICD-10-CM | POA: Diagnosis not present

## 2023-05-28 DIAGNOSIS — S42141D Displaced fracture of glenoid cavity of scapula, right shoulder, subsequent encounter for fracture with routine healing: Secondary | ICD-10-CM | POA: Diagnosis not present

## 2023-05-28 DIAGNOSIS — J449 Chronic obstructive pulmonary disease, unspecified: Secondary | ICD-10-CM | POA: Diagnosis not present

## 2023-05-28 DIAGNOSIS — E782 Mixed hyperlipidemia: Secondary | ICD-10-CM | POA: Diagnosis not present

## 2023-05-28 DIAGNOSIS — I4891 Unspecified atrial fibrillation: Secondary | ICD-10-CM | POA: Diagnosis not present

## 2023-05-29 DIAGNOSIS — J449 Chronic obstructive pulmonary disease, unspecified: Secondary | ICD-10-CM | POA: Diagnosis not present

## 2023-05-29 DIAGNOSIS — E782 Mixed hyperlipidemia: Secondary | ICD-10-CM | POA: Diagnosis not present

## 2023-05-29 DIAGNOSIS — I10 Essential (primary) hypertension: Secondary | ICD-10-CM | POA: Diagnosis not present

## 2023-05-29 DIAGNOSIS — I4891 Unspecified atrial fibrillation: Secondary | ICD-10-CM | POA: Diagnosis not present

## 2023-05-29 DIAGNOSIS — S72001D Fracture of unspecified part of neck of right femur, subsequent encounter for closed fracture with routine healing: Secondary | ICD-10-CM | POA: Diagnosis not present

## 2023-05-29 DIAGNOSIS — S42141D Displaced fracture of glenoid cavity of scapula, right shoulder, subsequent encounter for fracture with routine healing: Secondary | ICD-10-CM | POA: Diagnosis not present

## 2023-05-30 DIAGNOSIS — I4891 Unspecified atrial fibrillation: Secondary | ICD-10-CM | POA: Diagnosis not present

## 2023-05-30 DIAGNOSIS — S42141D Displaced fracture of glenoid cavity of scapula, right shoulder, subsequent encounter for fracture with routine healing: Secondary | ICD-10-CM | POA: Diagnosis not present

## 2023-05-30 DIAGNOSIS — E782 Mixed hyperlipidemia: Secondary | ICD-10-CM | POA: Diagnosis not present

## 2023-05-30 DIAGNOSIS — S72001D Fracture of unspecified part of neck of right femur, subsequent encounter for closed fracture with routine healing: Secondary | ICD-10-CM | POA: Diagnosis not present

## 2023-05-30 DIAGNOSIS — I10 Essential (primary) hypertension: Secondary | ICD-10-CM | POA: Diagnosis not present

## 2023-05-30 DIAGNOSIS — J449 Chronic obstructive pulmonary disease, unspecified: Secondary | ICD-10-CM | POA: Diagnosis not present

## 2023-06-04 DIAGNOSIS — I10 Essential (primary) hypertension: Secondary | ICD-10-CM | POA: Diagnosis not present

## 2023-06-04 DIAGNOSIS — J449 Chronic obstructive pulmonary disease, unspecified: Secondary | ICD-10-CM | POA: Diagnosis not present

## 2023-06-04 DIAGNOSIS — I4891 Unspecified atrial fibrillation: Secondary | ICD-10-CM | POA: Diagnosis not present

## 2023-06-04 DIAGNOSIS — S72001D Fracture of unspecified part of neck of right femur, subsequent encounter for closed fracture with routine healing: Secondary | ICD-10-CM | POA: Diagnosis not present

## 2023-06-04 DIAGNOSIS — S42141D Displaced fracture of glenoid cavity of scapula, right shoulder, subsequent encounter for fracture with routine healing: Secondary | ICD-10-CM | POA: Diagnosis not present

## 2023-06-04 DIAGNOSIS — E782 Mixed hyperlipidemia: Secondary | ICD-10-CM | POA: Diagnosis not present

## 2023-06-05 DIAGNOSIS — I10 Essential (primary) hypertension: Secondary | ICD-10-CM | POA: Diagnosis not present

## 2023-06-05 DIAGNOSIS — S42141D Displaced fracture of glenoid cavity of scapula, right shoulder, subsequent encounter for fracture with routine healing: Secondary | ICD-10-CM | POA: Diagnosis not present

## 2023-06-05 DIAGNOSIS — Z7901 Long term (current) use of anticoagulants: Secondary | ICD-10-CM | POA: Diagnosis not present

## 2023-06-05 DIAGNOSIS — J449 Chronic obstructive pulmonary disease, unspecified: Secondary | ICD-10-CM | POA: Diagnosis not present

## 2023-06-05 DIAGNOSIS — E782 Mixed hyperlipidemia: Secondary | ICD-10-CM | POA: Diagnosis not present

## 2023-06-05 DIAGNOSIS — I4891 Unspecified atrial fibrillation: Secondary | ICD-10-CM | POA: Diagnosis not present

## 2023-06-05 DIAGNOSIS — Z9181 History of falling: Secondary | ICD-10-CM | POA: Diagnosis not present

## 2023-06-05 DIAGNOSIS — S72001D Fracture of unspecified part of neck of right femur, subsequent encounter for closed fracture with routine healing: Secondary | ICD-10-CM | POA: Diagnosis not present

## 2023-06-10 DIAGNOSIS — E782 Mixed hyperlipidemia: Secondary | ICD-10-CM | POA: Diagnosis not present

## 2023-06-10 DIAGNOSIS — S42141D Displaced fracture of glenoid cavity of scapula, right shoulder, subsequent encounter for fracture with routine healing: Secondary | ICD-10-CM | POA: Diagnosis not present

## 2023-06-10 DIAGNOSIS — I10 Essential (primary) hypertension: Secondary | ICD-10-CM | POA: Diagnosis not present

## 2023-06-10 DIAGNOSIS — I4891 Unspecified atrial fibrillation: Secondary | ICD-10-CM | POA: Diagnosis not present

## 2023-06-10 DIAGNOSIS — J449 Chronic obstructive pulmonary disease, unspecified: Secondary | ICD-10-CM | POA: Diagnosis not present

## 2023-06-10 DIAGNOSIS — S72001D Fracture of unspecified part of neck of right femur, subsequent encounter for closed fracture with routine healing: Secondary | ICD-10-CM | POA: Diagnosis not present

## 2023-06-11 DIAGNOSIS — J449 Chronic obstructive pulmonary disease, unspecified: Secondary | ICD-10-CM | POA: Diagnosis not present

## 2023-06-11 DIAGNOSIS — E782 Mixed hyperlipidemia: Secondary | ICD-10-CM | POA: Diagnosis not present

## 2023-06-11 DIAGNOSIS — S72001D Fracture of unspecified part of neck of right femur, subsequent encounter for closed fracture with routine healing: Secondary | ICD-10-CM | POA: Diagnosis not present

## 2023-06-11 DIAGNOSIS — S42141D Displaced fracture of glenoid cavity of scapula, right shoulder, subsequent encounter for fracture with routine healing: Secondary | ICD-10-CM | POA: Diagnosis not present

## 2023-06-11 DIAGNOSIS — I4891 Unspecified atrial fibrillation: Secondary | ICD-10-CM | POA: Diagnosis not present

## 2023-06-11 DIAGNOSIS — I10 Essential (primary) hypertension: Secondary | ICD-10-CM | POA: Diagnosis not present

## 2023-06-18 DIAGNOSIS — J449 Chronic obstructive pulmonary disease, unspecified: Secondary | ICD-10-CM | POA: Diagnosis not present

## 2023-06-18 DIAGNOSIS — E782 Mixed hyperlipidemia: Secondary | ICD-10-CM | POA: Diagnosis not present

## 2023-06-18 DIAGNOSIS — S42141D Displaced fracture of glenoid cavity of scapula, right shoulder, subsequent encounter for fracture with routine healing: Secondary | ICD-10-CM | POA: Diagnosis not present

## 2023-06-18 DIAGNOSIS — I4891 Unspecified atrial fibrillation: Secondary | ICD-10-CM | POA: Diagnosis not present

## 2023-06-18 DIAGNOSIS — I10 Essential (primary) hypertension: Secondary | ICD-10-CM | POA: Diagnosis not present

## 2023-06-18 DIAGNOSIS — S72001D Fracture of unspecified part of neck of right femur, subsequent encounter for closed fracture with routine healing: Secondary | ICD-10-CM | POA: Diagnosis not present

## 2023-06-19 DIAGNOSIS — S72001D Fracture of unspecified part of neck of right femur, subsequent encounter for closed fracture with routine healing: Secondary | ICD-10-CM | POA: Diagnosis not present

## 2023-06-19 DIAGNOSIS — I4891 Unspecified atrial fibrillation: Secondary | ICD-10-CM | POA: Diagnosis not present

## 2023-06-19 DIAGNOSIS — E782 Mixed hyperlipidemia: Secondary | ICD-10-CM | POA: Diagnosis not present

## 2023-06-19 DIAGNOSIS — S42141D Displaced fracture of glenoid cavity of scapula, right shoulder, subsequent encounter for fracture with routine healing: Secondary | ICD-10-CM | POA: Diagnosis not present

## 2023-06-19 DIAGNOSIS — I10 Essential (primary) hypertension: Secondary | ICD-10-CM | POA: Diagnosis not present

## 2023-06-19 DIAGNOSIS — J449 Chronic obstructive pulmonary disease, unspecified: Secondary | ICD-10-CM | POA: Diagnosis not present

## 2023-06-28 DIAGNOSIS — E782 Mixed hyperlipidemia: Secondary | ICD-10-CM | POA: Diagnosis not present

## 2023-06-28 DIAGNOSIS — S42141D Displaced fracture of glenoid cavity of scapula, right shoulder, subsequent encounter for fracture with routine healing: Secondary | ICD-10-CM | POA: Diagnosis not present

## 2023-06-28 DIAGNOSIS — J449 Chronic obstructive pulmonary disease, unspecified: Secondary | ICD-10-CM | POA: Diagnosis not present

## 2023-06-28 DIAGNOSIS — S72001D Fracture of unspecified part of neck of right femur, subsequent encounter for closed fracture with routine healing: Secondary | ICD-10-CM | POA: Diagnosis not present

## 2023-06-28 DIAGNOSIS — I4891 Unspecified atrial fibrillation: Secondary | ICD-10-CM | POA: Diagnosis not present

## 2023-06-28 DIAGNOSIS — I10 Essential (primary) hypertension: Secondary | ICD-10-CM | POA: Diagnosis not present

## 2023-07-04 DIAGNOSIS — I10 Essential (primary) hypertension: Secondary | ICD-10-CM | POA: Diagnosis not present

## 2023-07-04 DIAGNOSIS — J449 Chronic obstructive pulmonary disease, unspecified: Secondary | ICD-10-CM | POA: Diagnosis not present

## 2023-07-04 DIAGNOSIS — S42141D Displaced fracture of glenoid cavity of scapula, right shoulder, subsequent encounter for fracture with routine healing: Secondary | ICD-10-CM | POA: Diagnosis not present

## 2023-07-04 DIAGNOSIS — I4891 Unspecified atrial fibrillation: Secondary | ICD-10-CM | POA: Diagnosis not present

## 2023-07-04 DIAGNOSIS — S72001D Fracture of unspecified part of neck of right femur, subsequent encounter for closed fracture with routine healing: Secondary | ICD-10-CM | POA: Diagnosis not present

## 2023-07-04 DIAGNOSIS — E782 Mixed hyperlipidemia: Secondary | ICD-10-CM | POA: Diagnosis not present

## 2023-07-05 DIAGNOSIS — I10 Essential (primary) hypertension: Secondary | ICD-10-CM | POA: Diagnosis not present

## 2023-07-05 DIAGNOSIS — J449 Chronic obstructive pulmonary disease, unspecified: Secondary | ICD-10-CM | POA: Diagnosis not present

## 2023-07-05 DIAGNOSIS — S72001D Fracture of unspecified part of neck of right femur, subsequent encounter for closed fracture with routine healing: Secondary | ICD-10-CM | POA: Diagnosis not present

## 2023-07-05 DIAGNOSIS — Z7901 Long term (current) use of anticoagulants: Secondary | ICD-10-CM | POA: Diagnosis not present

## 2023-07-05 DIAGNOSIS — S42141D Displaced fracture of glenoid cavity of scapula, right shoulder, subsequent encounter for fracture with routine healing: Secondary | ICD-10-CM | POA: Diagnosis not present

## 2023-07-05 DIAGNOSIS — I4891 Unspecified atrial fibrillation: Secondary | ICD-10-CM | POA: Diagnosis not present

## 2023-07-05 DIAGNOSIS — Z9181 History of falling: Secondary | ICD-10-CM | POA: Diagnosis not present

## 2023-07-05 DIAGNOSIS — E782 Mixed hyperlipidemia: Secondary | ICD-10-CM | POA: Diagnosis not present

## 2023-07-10 DIAGNOSIS — S42141D Displaced fracture of glenoid cavity of scapula, right shoulder, subsequent encounter for fracture with routine healing: Secondary | ICD-10-CM

## 2023-07-10 DIAGNOSIS — Z7901 Long term (current) use of anticoagulants: Secondary | ICD-10-CM

## 2023-07-10 DIAGNOSIS — I10 Essential (primary) hypertension: Secondary | ICD-10-CM

## 2023-07-10 DIAGNOSIS — S72001D Fracture of unspecified part of neck of right femur, subsequent encounter for closed fracture with routine healing: Secondary | ICD-10-CM

## 2023-07-10 DIAGNOSIS — E782 Mixed hyperlipidemia: Secondary | ICD-10-CM

## 2023-07-10 DIAGNOSIS — Z9181 History of falling: Secondary | ICD-10-CM

## 2023-07-10 DIAGNOSIS — I4891 Unspecified atrial fibrillation: Secondary | ICD-10-CM

## 2023-07-10 DIAGNOSIS — J449 Chronic obstructive pulmonary disease, unspecified: Secondary | ICD-10-CM

## 2023-07-11 DIAGNOSIS — Z23 Encounter for immunization: Secondary | ICD-10-CM | POA: Diagnosis not present

## 2023-07-16 DIAGNOSIS — I10 Essential (primary) hypertension: Secondary | ICD-10-CM | POA: Diagnosis not present

## 2023-07-16 DIAGNOSIS — J449 Chronic obstructive pulmonary disease, unspecified: Secondary | ICD-10-CM | POA: Diagnosis not present

## 2023-07-16 DIAGNOSIS — E782 Mixed hyperlipidemia: Secondary | ICD-10-CM | POA: Diagnosis not present

## 2023-07-16 DIAGNOSIS — I4891 Unspecified atrial fibrillation: Secondary | ICD-10-CM | POA: Diagnosis not present

## 2023-07-16 DIAGNOSIS — S42141D Displaced fracture of glenoid cavity of scapula, right shoulder, subsequent encounter for fracture with routine healing: Secondary | ICD-10-CM | POA: Diagnosis not present

## 2023-07-16 DIAGNOSIS — S72001D Fracture of unspecified part of neck of right femur, subsequent encounter for closed fracture with routine healing: Secondary | ICD-10-CM | POA: Diagnosis not present

## 2023-07-25 DIAGNOSIS — E782 Mixed hyperlipidemia: Secondary | ICD-10-CM | POA: Diagnosis not present

## 2023-07-25 DIAGNOSIS — I4891 Unspecified atrial fibrillation: Secondary | ICD-10-CM | POA: Diagnosis not present

## 2023-07-25 DIAGNOSIS — J449 Chronic obstructive pulmonary disease, unspecified: Secondary | ICD-10-CM | POA: Diagnosis not present

## 2023-07-25 DIAGNOSIS — S72001D Fracture of unspecified part of neck of right femur, subsequent encounter for closed fracture with routine healing: Secondary | ICD-10-CM | POA: Diagnosis not present

## 2023-07-25 DIAGNOSIS — S42141D Displaced fracture of glenoid cavity of scapula, right shoulder, subsequent encounter for fracture with routine healing: Secondary | ICD-10-CM | POA: Diagnosis not present

## 2023-07-25 DIAGNOSIS — I10 Essential (primary) hypertension: Secondary | ICD-10-CM | POA: Diagnosis not present

## 2023-07-26 ENCOUNTER — Telehealth: Payer: Self-pay

## 2023-07-26 NOTE — Telephone Encounter (Signed)
Copied from CRM [id number]. Topic: Quick Communication - Home Health Verbal Orders >> Jul 05, 2023 10:24 AM Everette C wrote: Caller/Agency: Delorise Jackson Number: [phone] Requesting OT/PT/Skilled Nursing/Social Work/Speech Therapy: OT  Frequency: 1w8 OT extension

## 2023-07-29 NOTE — Telephone Encounter (Signed)
OK for verbals

## 2023-07-29 NOTE — Telephone Encounter (Signed)
Verbals given  

## 2023-08-01 DIAGNOSIS — S42141D Displaced fracture of glenoid cavity of scapula, right shoulder, subsequent encounter for fracture with routine healing: Secondary | ICD-10-CM | POA: Diagnosis not present

## 2023-08-01 DIAGNOSIS — I4891 Unspecified atrial fibrillation: Secondary | ICD-10-CM | POA: Diagnosis not present

## 2023-08-01 DIAGNOSIS — I10 Essential (primary) hypertension: Secondary | ICD-10-CM | POA: Diagnosis not present

## 2023-08-01 DIAGNOSIS — S72001D Fracture of unspecified part of neck of right femur, subsequent encounter for closed fracture with routine healing: Secondary | ICD-10-CM | POA: Diagnosis not present

## 2023-08-01 DIAGNOSIS — E782 Mixed hyperlipidemia: Secondary | ICD-10-CM | POA: Diagnosis not present

## 2023-08-01 DIAGNOSIS — J449 Chronic obstructive pulmonary disease, unspecified: Secondary | ICD-10-CM | POA: Diagnosis not present

## 2023-08-04 DIAGNOSIS — S72001D Fracture of unspecified part of neck of right femur, subsequent encounter for closed fracture with routine healing: Secondary | ICD-10-CM | POA: Diagnosis not present

## 2023-08-04 DIAGNOSIS — Z7901 Long term (current) use of anticoagulants: Secondary | ICD-10-CM | POA: Diagnosis not present

## 2023-08-04 DIAGNOSIS — I10 Essential (primary) hypertension: Secondary | ICD-10-CM | POA: Diagnosis not present

## 2023-08-04 DIAGNOSIS — S42141D Displaced fracture of glenoid cavity of scapula, right shoulder, subsequent encounter for fracture with routine healing: Secondary | ICD-10-CM | POA: Diagnosis not present

## 2023-08-04 DIAGNOSIS — E782 Mixed hyperlipidemia: Secondary | ICD-10-CM | POA: Diagnosis not present

## 2023-08-04 DIAGNOSIS — Z9181 History of falling: Secondary | ICD-10-CM | POA: Diagnosis not present

## 2023-08-04 DIAGNOSIS — J449 Chronic obstructive pulmonary disease, unspecified: Secondary | ICD-10-CM | POA: Diagnosis not present

## 2023-08-04 DIAGNOSIS — I4891 Unspecified atrial fibrillation: Secondary | ICD-10-CM | POA: Diagnosis not present

## 2023-08-06 ENCOUNTER — Encounter: Payer: Self-pay | Admitting: Nurse Practitioner

## 2023-08-06 ENCOUNTER — Telehealth (INDEPENDENT_AMBULATORY_CARE_PROVIDER_SITE_OTHER): Payer: Medicare Other | Admitting: Nurse Practitioner

## 2023-08-06 VITALS — Resp 16 | Ht 62.0 in | Wt 125.0 lb

## 2023-08-06 DIAGNOSIS — R051 Acute cough: Secondary | ICD-10-CM

## 2023-08-06 DIAGNOSIS — J440 Chronic obstructive pulmonary disease with acute lower respiratory infection: Secondary | ICD-10-CM | POA: Diagnosis not present

## 2023-08-06 MED ORDER — LEVOFLOXACIN 750 MG PO TABS
750.0000 mg | ORAL_TABLET | Freq: Every day | ORAL | 0 refills | Status: DC
Start: 2023-08-06 — End: 2023-09-14

## 2023-08-06 MED ORDER — BENZONATATE 200 MG PO CAPS
200.0000 mg | ORAL_CAPSULE | Freq: Two times a day (BID) | ORAL | 0 refills | Status: DC | PRN
Start: 2023-08-06 — End: 2023-09-14

## 2023-08-06 MED ORDER — PREDNISONE 10 MG PO TABS
ORAL_TABLET | ORAL | 0 refills | Status: DC
Start: 2023-08-06 — End: 2023-09-14

## 2023-08-06 NOTE — Progress Notes (Signed)
[facility] [address] [place], [place] [address]  Internal MEDICINE  Telephone Visit  Patient Name: Karen Henson  [id number]  [id number]  Date of Service: 08/06/2023  I connected with the patient at 1530 by telephone and verified the patients identity using two identifiers.   I discussed the limitations, risks, security and privacy concerns of performing an evaluation and management service by telephone and the availability of in person appointments. I also discussed with the patient that there may be a patient responsible charge related to the service.  The patient expressed understanding and agrees to proceed.    Chief Complaint  Patient presents with   Telephone Screen    Congested since last Friday night, coughing sounds bad. Covid test negative. No fever.    Telephone Assessment    HPI Karen Henson presents for a telehealth virtual visit for upper respiratory infection Congested since last Friday night, coughing sounds bad. Covid test negative. No fever.  Also have headache.    Current Medication: Outpatient Encounter Medications as of 08/06/2023  Medication Sig   acetaminophen (TYLENOL) 500 MG tablet Take 500 mg by mouth every 6 (six) hours as needed for moderate pain or headache.   albuterol (VENTOLIN HFA) 108 (90 Base) MCG/ACT inhaler Inhale 2 puffs into the lungs every 6 (six) hours as needed for wheezing or shortness of breath.   apixaban (ELIQUIS) 2.5 MG TABS tablet Take 1 tablet (2.5 mg total) by mouth 2 (two) times daily.   benzonatate (TESSALON) 200 MG capsule Take 1 capsule (200 mg total) by mouth 2 (two) times daily as needed for cough.   levofloxacin (LEVAQUIN) 750 MG tablet Take 1 tablet (750 mg total) by mouth daily.   metoprolol succinate (TOPROL-XL) 50 MG 24 hr tablet Take 1 tablet (50 mg total) by mouth at bedtime.   montelukast (SINGULAIR) 10 MG tablet TAKE 1 TABLET BY MOUTH BEDTIME FOR ASTHMA   predniSONE (DELTASONE) 10 MG tablet Take one tab 3  x day for 3 days, then take one tab 2 x a day for 3 days and then take one tab a day for 3 days for copd   No facility-administered encounter medications on file as of 08/06/2023.    Surgical History: Past Surgical History:  Procedure Laterality Date   BREAST EXCISIONAL BIOPSY Left 1996   neg   BREAST SURGERY     CAPSULOTOMY     CATARACT EXTRACTION W/PHACO Left 10/31/2017   Procedure: CATARACT EXTRACTION PHACO AND INTRAOCULAR LENS PLACEMENT (IOC);  Surgeon: Nevada Crane, MD;  Location: [facility] ORS;  Service: Ophthalmology;  Laterality: Left;  [place] 00:43.7 AP% 19.6 CDE 8.57 FLUID PACK LOT # [id number]   CATARACT EXTRACTION W/PHACO Right 12/05/2017   Procedure: CATARACT EXTRACTION PHACO AND INTRAOCULAR LENS PLACEMENT (IOC);  Surgeon: Nevada Crane, MD;  Location: [facility] ORS;  Service: Ophthalmology;  Laterality: Right;  fluid pack lot #[id number]  exp 07/01/2019 [place]    00:31.4 AP%    8.8 CDE    2.79   COLONOSCOPY  02/15/2006   COLONOSCOPY WITH PROPOFOL N/A 03/21/2016   Procedure: COLONOSCOPY WITH PROPOFOL;  Surgeon: Earline Mayotte, MD;  Location: [facility] END[facility]COPY;  Service: Endoscopy;  Laterality: N/A;   COLONOSCOPY WITH PROPOFOL N/A 03/29/2021   Procedure: COLONOSCOPY WITH PROPOFOL;  Surgeon: Earline Mayotte, MD;  Location: [facility] ENDOSCOPY;  Service: Endoscopy;  Laterality: N/A;   DILATION AND CURETTAGE OF UTERUS     EYE SURGERY     HIP ARTHROPLASTY Right 02/10/2023  Procedure: ARTHROPLASTY BIPOLAR HIP (HEMIARTHROPLASTY);  Surgeon: Signa Kell, MD;  Location: [facility] ORS;  Service: Orthopedics;  Laterality: Right;   VARICOSE VEIN SURGERY      Medical History: Past Medical History:  Diagnosis Date   Allergy    Environmental Allergies   Arthritis    Bronchitis, chronic (HCC)    COPD (chronic obstructive pulmonary disease) (HCC)    Cough    CHRONIC   Dyspnea    OCCAS   Hypertension    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HCC)    Osteoporosis    Persistent atrial fibrillation (HCC)    a. Dx  01/2023 in setting of hip fx->rate controlled w/ bb. CHA2DS2VASc = 4-->eliquis 2.5 bid.   Tricuspid regurgitation    a. 01/2023 Echo: EF 55-60%, no rwma, nl RV fxn, RVSP . Mod dil RA. Mild MR. Mod-Sev TR.    Family History: Family History  Problem Relation Age of Onset   Hypertension Mother    Emphysema Father    Stroke Sister    Stroke Brother    Diabetes Maternal Aunt    Diabetes Maternal Grandmother    Lung cancer Grandchild    Breast cancer Neg Hx    Ovarian cancer Neg Hx    Colon cancer Neg Hx     Social History   Socioeconomic History   Marital status: Widowed    Spouse name: Not on file   Number of children: 2   Years of education: Not on file   Highest education level: 11th grade  Occupational History   Occupation: retired  Tobacco Use   Smoking status: Former    Current packs/day: 0.25    Types: Cigarettes   Smokeless tobacco: Never   Tobacco comments:    smoked a pack a week, quit about 30 years ago (in 1985)  Vaping Use   Vaping status: Never Used  Substance and Sexual Activity   Alcohol use: Yes    Comment: ocassional- monthly   Drug use: No   Sexual activity: Never  Other Topics Concern   Not on file  Social History Narrative   Not on file   Social Determinants of Health   Financial Resource Strain: Low Risk  (03/14/2023)   Overall Financial Resource Strain (CARDIA)    Difficulty of Paying Living Expenses: Not hard at all  Food Insecurity: No Food Insecurity (03/14/2023)   Hunger Vital Sign    Worried About Running Out of Food in the Last Year: Never true    Ran Out of Food in the Last Year: Never true  Transportation Needs: No Transportation Needs (03/14/2023)   PRAPARE - Administrator, Civil Service (Medical): No    Lack of Transportation (Non-Medical): No  Physical Activity: Unknown (03/14/2023)   Exercise Vital Sign    Days of Exercise per Week: 0 days    Minutes of Exercise per Session: Not on file  Stress: No Stress  Concern Present (03/14/2023)   [employer] of Occupational Health - Occupational Stress Questionnaire    Feeling of Stress : Not at all  Social Connections: Moderately Integrated (03/14/2023)   Social Connection and Isolation Panel [NHANES]    Frequency of Communication with Friends and Family: More than three times a week    Frequency of Social Gatherings with Friends and Family: More than three times a week    Attends Religious Services: More than 4 times per year    Active Member of [employer] or Organizations: Yes    Attends Club or  Organization Meetings: More than 4 times per year    Marital Status: Widowed  Intimate Partner Violence: Not At Risk (02/09/2023)   Humiliation, Afraid, Rape, and Kick questionnaire    Fear of Current or Ex-Partner: No    Emotionally Abused: No    Physically Abused: No    Sexually Abused: No      Review of Systems  Constitutional:  Positive for fatigue.  HENT:  Positive for congestion and postnasal drip. Negative for rhinorrhea, sinus pressure, sinus pain, sneezing and sore throat.   Respiratory:  Positive for cough, chest tightness, shortness of breath and wheezing.   Cardiovascular: Negative.  Negative for chest pain and palpitations.  Neurological:  Positive for headaches.    Vital Signs: Resp 16   Ht 5\' 2"  (1.575 m)   Wt 125 lb (56.7 kg)   BMI 22.86 kg/m    Observation/Objective: She is alert and oriented. No acute distress noted. Voice is hoarse and patient is coughing.     Assessment/Plan: 1. Chronic obstructive pulmonary disease with acute lower respiratory infection (HCC) Antibiotic and prednisone taper prescribed. Take until gone  - levofloxacin (LEVAQUIN) 750 MG tablet; Take 1 tablet (750 mg total) by mouth daily.  Dispense: 7 tablet; Refill: 0 - predniSONE (DELTASONE) 10 MG tablet; Take one tab 3 x day for 3 days, then take one tab 2 x a day for 3 days and then take one tab a day for 3 days for copd  Dispense: 18 tablet; Refill:  0  2. Acute cough Cough medication prescribed as needed  - benzonatate (TESSALON) 200 MG capsule; Take 1 capsule (200 mg total) by mouth 2 (two) times daily as needed for cough.  Dispense: 30 capsule; Refill: 0   General Counseling: Earlyn verbalizes understanding of the findings of today's phone visit and agrees with plan of treatment. I have discussed any further diagnostic evaluation that may be needed or ordered today. We also reviewed her medications today. she has been encouraged to call the office with any questions or concerns that should arise related to todays visit.  Return if symptoms worsen or fail to improve.   No orders of the defined types were placed in this encounter.   Meds ordered this encounter  Medications   levofloxacin (LEVAQUIN) 750 MG tablet    Sig: Take 1 tablet (750 mg total) by mouth daily.    Dispense:  7 tablet    Refill:  0    Fill script today asap   predniSONE (DELTASONE) 10 MG tablet    Sig: Take one tab 3 x day for 3 days, then take one tab 2 x a day for 3 days and then take one tab a day for 3 days for copd    Dispense:  18 tablet    Refill:  0   benzonatate (TESSALON) 200 MG capsule    Sig: Take 1 capsule (200 mg total) by mouth 2 (two) times daily as needed for cough.    Dispense:  30 capsule    Refill:  0    Fill new script today    Time spent:10 Minutes Time spent with patient included reviewing progress notes, labs, imaging studies, and discussing plan for follow up.  [place] Controlled Substance Database was reviewed by me for overdose risk score (ORS) if appropriate.  This patient was seen by Sallyanne Kuster, FNP-C in collaboration with Dr. Beverely Risen as a part of collaborative care agreement.  Delancey Moraes R. Tedd Sias, MSN, FNP-C Internal medicine

## 2023-08-09 DIAGNOSIS — I10 Essential (primary) hypertension: Secondary | ICD-10-CM | POA: Diagnosis not present

## 2023-08-09 DIAGNOSIS — J449 Chronic obstructive pulmonary disease, unspecified: Secondary | ICD-10-CM | POA: Diagnosis not present

## 2023-08-09 DIAGNOSIS — E782 Mixed hyperlipidemia: Secondary | ICD-10-CM | POA: Diagnosis not present

## 2023-08-09 DIAGNOSIS — S72001D Fracture of unspecified part of neck of right femur, subsequent encounter for closed fracture with routine healing: Secondary | ICD-10-CM | POA: Diagnosis not present

## 2023-08-09 DIAGNOSIS — I4891 Unspecified atrial fibrillation: Secondary | ICD-10-CM | POA: Diagnosis not present

## 2023-08-09 DIAGNOSIS — S42141D Displaced fracture of glenoid cavity of scapula, right shoulder, subsequent encounter for fracture with routine healing: Secondary | ICD-10-CM | POA: Diagnosis not present

## 2023-08-13 DIAGNOSIS — E782 Mixed hyperlipidemia: Secondary | ICD-10-CM | POA: Diagnosis not present

## 2023-08-13 DIAGNOSIS — S72001D Fracture of unspecified part of neck of right femur, subsequent encounter for closed fracture with routine healing: Secondary | ICD-10-CM | POA: Diagnosis not present

## 2023-08-13 DIAGNOSIS — I10 Essential (primary) hypertension: Secondary | ICD-10-CM | POA: Diagnosis not present

## 2023-08-13 DIAGNOSIS — S42141D Displaced fracture of glenoid cavity of scapula, right shoulder, subsequent encounter for fracture with routine healing: Secondary | ICD-10-CM | POA: Diagnosis not present

## 2023-08-13 DIAGNOSIS — J449 Chronic obstructive pulmonary disease, unspecified: Secondary | ICD-10-CM | POA: Diagnosis not present

## 2023-08-13 DIAGNOSIS — I4891 Unspecified atrial fibrillation: Secondary | ICD-10-CM | POA: Diagnosis not present

## 2023-08-22 DIAGNOSIS — I10 Essential (primary) hypertension: Secondary | ICD-10-CM | POA: Diagnosis not present

## 2023-08-22 DIAGNOSIS — J449 Chronic obstructive pulmonary disease, unspecified: Secondary | ICD-10-CM | POA: Diagnosis not present

## 2023-08-22 DIAGNOSIS — E782 Mixed hyperlipidemia: Secondary | ICD-10-CM | POA: Diagnosis not present

## 2023-08-22 DIAGNOSIS — S42141D Displaced fracture of glenoid cavity of scapula, right shoulder, subsequent encounter for fracture with routine healing: Secondary | ICD-10-CM | POA: Diagnosis not present

## 2023-08-22 DIAGNOSIS — S72001D Fracture of unspecified part of neck of right femur, subsequent encounter for closed fracture with routine healing: Secondary | ICD-10-CM | POA: Diagnosis not present

## 2023-08-22 DIAGNOSIS — I4891 Unspecified atrial fibrillation: Secondary | ICD-10-CM | POA: Diagnosis not present

## 2023-08-28 DIAGNOSIS — I10 Essential (primary) hypertension: Secondary | ICD-10-CM | POA: Diagnosis not present

## 2023-08-28 DIAGNOSIS — E782 Mixed hyperlipidemia: Secondary | ICD-10-CM | POA: Diagnosis not present

## 2023-08-28 DIAGNOSIS — S72001D Fracture of unspecified part of neck of right femur, subsequent encounter for closed fracture with routine healing: Secondary | ICD-10-CM | POA: Diagnosis not present

## 2023-08-28 DIAGNOSIS — J449 Chronic obstructive pulmonary disease, unspecified: Secondary | ICD-10-CM | POA: Diagnosis not present

## 2023-08-28 DIAGNOSIS — I4891 Unspecified atrial fibrillation: Secondary | ICD-10-CM | POA: Diagnosis not present

## 2023-08-28 DIAGNOSIS — S42141D Displaced fracture of glenoid cavity of scapula, right shoulder, subsequent encounter for fracture with routine healing: Secondary | ICD-10-CM | POA: Diagnosis not present

## 2023-09-02 DIAGNOSIS — Z96641 Presence of right artificial hip joint: Secondary | ICD-10-CM | POA: Diagnosis not present

## 2023-09-02 DIAGNOSIS — S42151A Displaced fracture of neck of scapula, right shoulder, initial encounter for closed fracture: Secondary | ICD-10-CM | POA: Diagnosis not present

## 2023-09-02 DIAGNOSIS — S42141A Displaced fracture of glenoid cavity of scapula, right shoulder, initial encounter for closed fracture: Secondary | ICD-10-CM | POA: Diagnosis not present

## 2023-09-10 ENCOUNTER — Encounter: Payer: Self-pay | Admitting: Emergency Medicine

## 2023-09-11 ENCOUNTER — Ambulatory Visit: Payer: Medicare Other | Admitting: Emergency Medicine

## 2023-09-11 VITALS — Ht 62.0 in | Wt 120.0 lb

## 2023-09-11 DIAGNOSIS — Z0000 Encounter for general adult medical examination without abnormal findings: Secondary | ICD-10-CM

## 2023-09-11 DIAGNOSIS — Z1231 Encounter for screening mammogram for malignant neoplasm of breast: Secondary | ICD-10-CM

## 2023-09-11 NOTE — Progress Notes (Signed)
Subjective:   Karen Henson is a 82 y.o. female who presents for Medicare Annual (Subsequent) preventive examination.  Visit Complete: Virtual I connected with  Karen Henson on 09/11/23 by a audio enabled telemedicine application and verified that I am speaking with the correct person using two identifiers.  Patient Location: Home  Provider Location: Home Office  I discussed the limitations of evaluation and management by telemedicine. The patient expressed understanding and agreed to proceed.  Vital Signs: Because this visit was a virtual/telehealth visit, some criteria may be missing or patient reported. Any vitals not documented were not able to be obtained and vitals that have been documented are patient reported.   Cardiac Risk Factors include: advanced age (>70men, >34 women);hypertension;Other (see comment), Risk factor comments: A. Fib     Objective:    Today's Vitals   09/11/23 0812  Weight: 120 lb (54.4 kg)  Height: 5\' 2"  (1.575 m)   Body mass index is 21.95 kg/m.     09/11/2023    8:29 AM 03/29/2021    6:53 AM 08/11/2020   11:14 AM 08/10/2019    2:56 PM 08/06/2018    9:14 AM 10/31/2017    7:05 AM 07/23/2017   10:21 AM  Advanced Directives  Does Patient Have a Medical Advance Directive? Yes Yes Yes Yes Yes Yes Yes  Type of Estate agent of [place];Living will Living will Healthcare Power of [place];Living will Healthcare Power of [place];Living will Living will;Healthcare Power of [employer] Power of Gas;Living will Healthcare Power of [place];Living will  Does patient want to make changes to medical advance directive? No - Patient declined        Copy of Healthcare Power of Attorney in Chart? No - copy requested  No - copy requested No - copy requested No - copy requested No - copy requested No - copy requested    Current Medications (verified) Outpatient Encounter Medications as of 09/11/2023  Medication Sig    acetaminophen (TYLENOL) 500 MG tablet Take 500 mg by mouth every 6 (six) hours as needed for moderate pain or headache.   albuterol (VENTOLIN HFA) 108 (90 Base) MCG/ACT inhaler Inhale 2 puffs into the lungs every 6 (six) hours as needed for wheezing or shortness of breath.   apixaban (ELIQUIS) 2.5 MG TABS tablet Take 1 tablet (2.5 mg total) by mouth 2 (two) times daily.   benzonatate (TESSALON) 200 MG capsule Take 1 capsule (200 mg total) by mouth 2 (two) times daily as needed for cough.   metoprolol succinate (TOPROL-XL) 50 MG 24 hr tablet Take 1 tablet (50 mg total) by mouth at bedtime.   montelukast (SINGULAIR) 10 MG tablet TAKE 1 TABLET BY MOUTH BEDTIME FOR ASTHMA   pseudoephedrine-guaifenesin (MUCINEX D) 60-600 MG 12 hr tablet Take 1 tablet by mouth daily.   levofloxacin (LEVAQUIN) 750 MG tablet Take 1 tablet (750 mg total) by mouth daily. (Patient not taking: Reported on 09/11/2023)   predniSONE (DELTASONE) 10 MG tablet Take one tab 3 x day for 3 days, then take one tab 2 x a day for 3 days and then take one tab a day for 3 days for copd (Patient not taking: Reported on 09/11/2023)   No facility-administered encounter medications on file as of 09/11/2023.    Allergies (verified) Lisinopril   History: Past Medical History:  Diagnosis Date   Allergy    Environmental Allergies   Arthritis    Bronchitis, chronic (HCC)    COPD (chronic obstructive  pulmonary disease) (HCC)    Cough    CHRONIC   Dyspnea    OCCAS   Hypertension    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HCC)    Osteoporosis    Persistent atrial fibrillation (HCC)    a. Dx 01/2023 in setting of hip fx->rate controlled w/ bb. CHA2DS2VASc = 4-->eliquis 2.5 bid.   Tricuspid regurgitation    a. 01/2023 Echo: EF 55-60%, no rwma, nl RV fxn, RVSP . Mod dil RA. Mild MR. Mod-Sev TR.   Past Surgical History:  Procedure Laterality Date   BREAST EXCISIONAL BIOPSY Left 1996   neg   BREAST SURGERY     CAPSULOTOMY     CATARACT EXTRACTION W/PHACO  Left 10/31/2017   Procedure: CATARACT EXTRACTION PHACO AND INTRAOCULAR LENS PLACEMENT (IOC);  Surgeon: Nevada Crane, MD;  Location: [facility] ORS;  Service: Ophthalmology;  Laterality: Left;  [place] 00:43.7 AP% 19.6 CDE 8.57 FLUID PACK LOT # [id number]   CATARACT EXTRACTION W/PHACO Right 12/05/2017   Procedure: CATARACT EXTRACTION PHACO AND INTRAOCULAR LENS PLACEMENT (IOC);  Surgeon: Nevada Crane, MD;  Location: [facility] ORS;  Service: Ophthalmology;  Laterality: Right;  fluid pack lot #[id number]  exp 07/01/2019 [place]    00:31.4 AP%    8.8 CDE    2.79   COLONOSCOPY  02/15/2006   COLONOSCOPY WITH PROPOFOL N/A 03/21/2016   Procedure: COLONOSCOPY WITH PROPOFOL;  Surgeon: Earline Mayotte, MD;  Location[facility];  Service: Endoscopy;  Laterality: N/A;   COLONOSCOPY WITH PROPOFOL N/A 03/29/2021   Procedure: COLONOSCOPY WITH PROPOFOL;  Surgeon: Earline Mayotte, MD;  Location: [facility] ENDOSCOPY;  Service: Endoscopy;  Laterality: N/A;   DILATION AND CURETTAGE OF UTERUS     EYE SURGERY     HIP ARTHROPLASTY Right 02/10/2023   Procedure: ARTHROPLASTY BIPOLAR HIP (HEMIARTHROPLASTY);  Surgeon: Signa Kell, MD;  Location: [facility] ORS;  Service: Orthopedics;  Laterality: Right;   VARICOSE VEIN SURGERY     Family History  Problem Relation Age of Onset   Hypertension Mother    Emphysema Father    Stroke Sister    Stroke Brother    Diabetes Maternal Aunt    Diabetes Maternal Grandmother    Lung cancer Grandchild    Breast cancer Neg Hx    Ovarian cancer Neg Hx    Colon cancer Neg Hx    Social History   Socioeconomic History   Marital status: Widowed    Spouse name: Not on file   Number of children: 2   Years of education: Not on file   Highest education level: 11th grade  Occupational History   Occupation: retired  Tobacco Use   Smoking status: Former    Current packs/day: 0.00    Average packs/day: 0.1 packs/day for 10.0 years (1.0 ttl pk-yrs)    Types: Cigarettes    Start date: 33     Quit date: 1985    Years since quitting: 39.9   Smokeless tobacco: Never   Tobacco comments:    smoked a pack a week, quit about 30 years ago (in 1985)  Vaping Use   Vaping status: Never Used  Substance and Sexual Activity   Alcohol use: Yes    Comment: ocassional- monthly or less, 1 glass of wine or beer   Drug use: No   Sexual activity: Never  Other Topics Concern   Not on file  Social History Narrative   Not on file   Social Determinants of Health   Financial Resource Strain: Low Risk  (09/11/2023)  Overall Financial Resource Strain (CARDIA)    Difficulty of Paying Living Expenses: Not hard at all  Food Insecurity: No Food Insecurity (09/11/2023)   Hunger Vital Sign    Worried About Running Out of Food in the Last Year: Never true    Ran Out of Food in the Last Year: Never true  Transportation Needs: No Transportation Needs (09/11/2023)   PRAPARE - Administrator, Civil Service (Medical): No    Lack of Transportation (Non-Medical): No  Physical Activity: Insufficiently Active (09/11/2023)   Exercise Vital Sign    Days of Exercise per Week: 4 days    Minutes of Exercise per Session: 20 min  Stress: No Stress Concern Present (09/11/2023)   [employer] of Occupational Health - Occupational Stress Questionnaire    Feeling of Stress : Not at all  Social Connections: Moderately Integrated (09/11/2023)   Social Connection and Isolation Panel [NHANES]    Frequency of Communication with Friends and Family: More than three times a week    Frequency of Social Gatherings with Friends and Family: More than three times a week    Attends Religious Services: More than 4 times per year    Active Member of [employer] or Organizations: Yes    Attends Banker Meetings: More than 4 times per year    Marital Status: Widowed    Tobacco Counseling Counseling given: Not Answered Tobacco comments: smoked a pack a week, quit about 30 years ago (in  1985)   Clinical Intake:  Pre-visit preparation completed: Yes  Pain : No/denies pain     BMI - recorded: 21.95 Nutritional Status: BMI of 19-24  Normal Nutritional Risks: None Diabetes: No  How often do you need to have someone help you when you read instructions, pamphlets, or other written materials from your doctor or pharmacy?: 1 - Never  Interpreter Needed?: No  Information entered by :: Tora Kindred, CMA   Activities of Daily Living    09/11/2023    8:15 AM 04/29/2023    8:26 AM  In your present state of health, do you have any difficulty performing the following activities:  Hearing? 0 0  Vision? 0 0  Difficulty concentrating or making decisions? 0 0  Walking or climbing stairs? 1 1  Comment uses a cane outside due to recent hip fracture and surgery   Dressing or bathing? 0 0  Doing errands, shopping? 0 1  Preparing Food and eating ? N   Using the Toilet? N   In the past six months, have you accidently leaked urine? N   Do you have problems with loss of bowel control? N   Managing your Medications? N   Managing your Finances? N   Housekeeping or managing your Housekeeping? N     Patient Care Team: Erasmo Downer, MD as PCP - General (Family Medicine) Antonieta Iba, MD as PCP - Cardiology (Cardiology) Lemar Livings Merrily Pew, MD (General Surgery) Yevonne Pax, MD as Consulting Physician (Internal Medicine) [facility], [facility])  Indicate any recent Medical Services you may have received from other than [facility] providers in the past year (date may be approximate).     Assessment:   This is a routine wellness examination for [place].  Hearing/Vision screen Hearing Screening - Comments:: Denies hearing loss Vision Screening - Comments:: Gets eye exams   Goals Addressed               This Visit's Progress  Patient Stated (pt-stated)        Exercise more      Depression Screen    09/11/2023    8:27 AM 04/29/2023    8:26  AM 03/14/2023   10:19 AM 09/06/2022   10:02 AM 02/15/2022   10:48 AM 08/17/2021    9:45 AM 08/11/2020   11:11 AM  PHQ 2/9 Scores  PHQ - 2 Score 0 0 0 0 0 0 0  PHQ- 9 Score 0 0 2 0 0 0     Fall Risk    09/11/2023    8:22 AM 03/14/2023   10:19 AM 09/06/2022   10:02 AM 02/15/2022   10:48 AM 08/17/2021    9:45 AM  Fall Risk   Falls in the past year? 1 1 0 0 0  Number falls in past yr: 0 0 0 0 0  Injury with Fall? 1 1 0 0 0  Risk for fall due to : History of fall(s);Impaired balance/gait;Orthopedic patient  No Fall Risks  No Fall Risks  Follow up Education provided;Falls prevention discussed;Falls evaluation completed  Falls evaluation completed  Falls evaluation completed    MEDICARE RISK AT HOME: Medicare Risk at Home Any stairs in or around the home?: No (ramp) If so, are there any without handrails?: No Home free of loose throw rugs in walkways, pet beds, electrical cords, etc?: Yes Adequate lighting in your home to reduce risk of falls?: Yes Life alert?: Yes Use of a cane, walker or w/c?: Yes (uses cane outside due to recent hip injury and surgery) Grab bars in the bathroom?: Yes Shower chair or bench in shower?: Yes Elevated toilet seat or a handicapped toilet?: Yes  TIMED UP AND GO:  Was the test performed?  No    Cognitive Function:        09/11/2023    8:30 AM 09/06/2022   10:06 AM 08/17/2021    9:45 AM 08/06/2018    9:18 AM  6CIT Screen  What Year? 0 points 0 points 0 points 0 points  What month? 0 points 0 points 0 points 0 points  What time? 0 points 0 points 0 points 0 points  Count back from 20 0 points 0 points 0 points 0 points  Months in reverse 0 points 0 points 0 points 0 points  Repeat phrase 2 points 4 points 2 points 0 points  Total Score 2 points 4 points 2 points 0 points    Immunizations Immunization History  Administered Date(s) Administered   Fluad Quad(high Dose 65+) 08/08/2022   Influenza Inj Mdck Quad Pf 05/29/2019   Influenza, High  Dose Seasonal PF 07/23/2017, 07/21/2018, 07/12/2020   Influenza-Unspecified 08/01/2015, 07/27/2021   Moderna Covid-19 Vaccine Bivalent Booster 20yrs & up 08/20/2021, 08/08/2022   Moderna Sars-Covid-2 Vaccination 10/13/2019, 11/10/2019, 08/17/2020, 04/25/2021   PPD Test 02/14/2023, 02/28/2023   Pneumococcal Conjugate-13 08/01/2015   Pneumococcal Polysaccharide-23 07/26/2014   Rsv, Bivalent, Protein Subunit Rsvpref,pf Verdis Frederickson) 08/08/2022   Td 02/26/2008   Zoster Recombinant(Shingrix) 07/21/2018, 09/26/2018   Zoster, Live 03/17/2009    TDAP status: Up to date  Flu Vaccine status: Up to date  Pneumococcal vaccine status: Up to date  Covid-19 vaccine status: Information provided on how to obtain vaccines.   Qualifies for Shingles Vaccine? Yes   Zostavax completed Yes   Shingrix Completed?: Yes  Screening Tests Health Maintenance  Topic Date Due   DTaP/Tdap/Td (2 - Tdap) 02/25/2018   COVID-19 Vaccine (7 - 2023-24 season) 06/02/2023   Medicare  Annual Wellness (AWV)  09/10/2024   DEXA SCAN  12/25/2024   Pneumonia Vaccine 79+ Years old  Completed   INFLUENZA VACCINE  Completed   Zoster Vaccines- Shingrix  Completed   HPV VACCINES  Aged Out   Colonoscopy  Discontinued    Health Maintenance  Health Maintenance Due  Topic Date Due   DTaP/Tdap/Td (2 - Tdap) 02/25/2018   COVID-19 Vaccine (7 - 2023-24 season) 06/02/2023    Colorectal cancer screening: No longer required.   Mammogram status: Ordered 09/11/23. Pt provided with contact info and advised to call to schedule appt.   Bone Density status: Completed 12/26/22. Results reflect: Bone density results: OSTEOPOROSIS. Repeat every 2 years.  Lung Cancer Screening: (Low Dose CT Chest recommended if Age 37-80 years, 20 pack-year currently smoking OR have quit w/in 15years.) does not qualify.   Lung Cancer Screening Referral: n/a  Additional Screening:  Hepatitis C Screening: does not qualify;   Vision Screening:  Recommended annual ophthalmology exams for early detection of glaucoma and other disorders of the eye.  Dental Screening: Recommended annual dental exams for proper oral hygiene   Community Resource Referral / Chronic Care Management: CRR required this visit?  No   CCM required this visit?  No     Plan:     I have personally reviewed and noted the following in the patient's chart:   Medical and social history Use of alcohol, tobacco or illicit drugs  Current medications and supplements including opioid prescriptions. Patient is not currently taking opioid prescriptions. Functional ability and status Nutritional status Physical activity Advanced directives List of other physicians Hospitalizations, surgeries, and ER visits in previous 12 months Vitals Screenings to include cognitive, depression, and falls Referrals and appointments  In addition, I have reviewed and discussed with patient certain preventive protocols, quality metrics, and best practice recommendations. A written personalized care plan for preventive services as well as general preventive health recommendations were provided to patient.     Tora Kindred, CMA   09/11/2023   After Visit Summary: (MyChart) Due to this being a telephonic visit, the after visit summary with patients personalized plan was offered to patient via MyChart   Nurse Notes:  6 CIT Score - 2 Patient states received Tdap while in the hospital 01/2023 for hip surgery. Patient states received Covid booster in the fall of 2024. Placed an order for a MMG.

## 2023-09-11 NOTE — Patient Instructions (Addendum)
Karen Henson , Thank you for taking time to come for your Medicare Wellness Visit. I appreciate your ongoing commitment to your health goals. Please review the following plan we discussed and let me know if I can assist you in the future.   Referrals/Orders/Follow-Ups/Clinician Recommendations: I have placed an order for a mammogram. Call [facility] @ [phone] to schedule at your earliest convenience.  This is a list of the screening recommended for you and due dates:  Health Maintenance  Topic Date Due   DTaP/Tdap/Td vaccine (2 - Tdap) 02/25/2018   COVID-19 Vaccine (7 - 2023-24 season) 06/02/2023   Mammogram  08/16/2023   Medicare Annual Wellness Visit  09/10/2024   DEXA scan (bone density measurement)  12/25/2024   Pneumonia Vaccine  Completed   Flu Shot  Completed   Zoster (Shingles) Vaccine  Completed   HPV Vaccine  Aged Out   Colon Cancer Screening  Discontinued    Advanced directives: (Copy Requested) Please bring a copy of your health care power of attorney and living will to the office to be added to your chart at your convenience.  Next Medicare Annual Wellness Visit scheduled for next year: Yes, 09/16/24 @ 8:10am  Fall Prevention in the Home, Adult Falls can cause injuries and affect people of all ages. There are many simple things that you can do to make your home safe and to help prevent falls. If you need it, ask for help making these changes. What actions can I take to prevent falls? General information Use good lighting in all rooms. Make sure to: Replace any light bulbs that burn out. Turn on lights if it is dark and use night-lights. Keep items that you use often in easy-to-reach places. Lower the shelves around your home if needed. Move furniture so that there are clear paths around it. Do not keep throw rugs or other things on the floor that can make you trip. If any of your floors are uneven, fix them. Add color or contrast paint or tape to clearly  mark and help you see: Grab bars or handrails. First and last steps of staircases. Where the edge of each step is. If you use a ladder or stepladder: Make sure that it is fully opened. Do not climb a closed ladder. Make sure the sides of the ladder are locked in place. Have someone hold the ladder while you use it. Know where your pets are as you move through your home. What can I do in the bathroom?     Keep the floor dry. Clean up any water that is on the floor right away. Remove soap buildup in the bathtub or shower. Buildup makes bathtubs and showers slippery. Use non-skid mats or decals on the floor of the bathtub or shower. Attach bath mats securely with double-sided, non-slip rug tape. If you need to sit down while you are in the shower, use a non-slip stool. Install grab bars by the toilet and in the bathtub and shower. Do not use towel bars as grab bars. What can I do in the bedroom? Make sure that you have a light by your bed that is easy to reach. Do not use any sheets or blankets on your bed that hang to the floor. Have a firm bench or chair with side arms that you can use for support when you get dressed. What can I do in the kitchen? Clean up any spills right away. If you need to reach something above you, use  a sturdy step stool that has a grab bar. Keep electrical cables out of the way. Do not use floor polish or wax that makes floors slippery. What can I do with my stairs? Do not leave anything on the stairs. Make sure that you have a light switch at the top and the bottom of the stairs. Have them installed if you do not have them. Make sure that there are handrails on both sides of the stairs. Fix handrails that are broken or loose. Make sure that handrails are as long as the staircases. Install non-slip stair treads on all stairs in your home if they do not have carpet. Avoid having throw rugs at the top or bottom of stairs, or secure the rugs with carpet tape to  prevent them from moving. Choose a carpet design that does not hide the edge of steps on the stairs. Make sure that carpet is firmly attached to the stairs. Fix any carpet that is loose or worn. What can I do on the outside of my home? Use bright outdoor lighting. Repair the edges of walkways and driveways and fix any cracks. Clear paths of anything that can make you trip, such as tools or rocks. Add color or contrast paint or tape to clearly mark and help you see high doorway thresholds. Trim any bushes or trees on the main path into your home. Check that handrails are securely fastened and in good repair. Both sides of all steps should have handrails. Install guardrails along the edges of any raised decks or porches. Have leaves, snow, and ice cleared regularly. Use sand, salt, or ice melt on walkways during winter months if you live where there is ice and snow. In the garage, clean up any spills right away, including grease or oil spills. What other actions can I take? Review your medicines with your health care provider. Some medicines can make you confused or feel dizzy. This can increase your chance of falling. Wear closed-toe shoes that fit well and support your feet. Wear shoes that have rubber soles and low heels. Use a cane, walker, scooter, or crutches that help you move around if needed. Talk with your provider about other ways that you can decrease your risk of falls. This may include seeing a physical therapist to learn to do exercises to improve movement and strength. Where to find more information Centers for Disease Control and Prevention, STEADI: TonerPromos.no [employer] on Aging: BaseRingTones.pl National Institute on Aging: BaseRingTones.pl Contact a health care provider if: You are afraid of falling at home. You feel weak, drowsy, or dizzy at home. You fall at home. Get help right away if you: Lose consciousness or have trouble moving after a fall. Have a fall that causes a  head injury. These symptoms may be an emergency. Get help right away. Call 911. Do not wait to see if the symptoms will go away. Do not drive yourself to the hospital. This information is not intended to replace advice given to you by your health care provider. Make sure you discuss any questions you have with your health care provider. Document Revised: 05/21/2022 Document Reviewed: 05/21/2022 Elsevier Patient Education  2024 [employer].

## 2023-09-14 ENCOUNTER — Other Ambulatory Visit: Payer: Self-pay | Admitting: Nurse Practitioner

## 2023-09-14 ENCOUNTER — Telehealth: Payer: Self-pay | Admitting: Nurse Practitioner

## 2023-09-14 DIAGNOSIS — R051 Acute cough: Secondary | ICD-10-CM

## 2023-09-14 MED ORDER — BENZONATATE 200 MG PO CAPS
200.0000 mg | ORAL_CAPSULE | Freq: Two times a day (BID) | ORAL | 0 refills | Status: DC | PRN
Start: 2023-09-14 — End: 2023-10-31

## 2023-09-14 NOTE — Progress Notes (Signed)
Called on-call line over the weekend for cough and runny nose.  Asking for levofloxacin and benzonatate.  Was treated about 1 month ago for a lower respiratory infection with levofloxacin, prednisone taper and benzonatate.  Sent benzonatate. Can continue guaifenesin OTC as well. Can order a stat chest xray if her symptoms worsen and determine if another course of antibiotics are warranted again.

## 2023-09-14 NOTE — Telephone Encounter (Signed)
Patient called Saturday morning requesting refills on benzonatate and levofloxacin for cough and runny nose. I called patient back letting her know Alyssa sent in refill for benzonatate and told her to take mucinex or mucinex-d. Patient to call office on Monday to let us know if getting relief or symptoms are getting worse-Toni

## 2023-10-17 DIAGNOSIS — S8002XA Contusion of left knee, initial encounter: Secondary | ICD-10-CM | POA: Diagnosis not present

## 2023-10-17 DIAGNOSIS — M25562 Pain in left knee: Secondary | ICD-10-CM | POA: Diagnosis not present

## 2023-10-24 ENCOUNTER — Other Ambulatory Visit: Payer: Self-pay | Admitting: Orthopedic Surgery

## 2023-10-24 ENCOUNTER — Ambulatory Visit
Admission: RE | Admit: 2023-10-24 | Discharge: 2023-10-24 | Disposition: A | Discharge status: Home / Self Care | Payer: Medicare Other | Source: Ambulatory Visit | Attending: Orthopedic Surgery | Admitting: Orthopedic Surgery

## 2023-10-24 DIAGNOSIS — S8002XD Contusion of left knee, subsequent encounter: Secondary | ICD-10-CM

## 2023-10-24 DIAGNOSIS — M25562 Pain in left knee: Secondary | ICD-10-CM | POA: Diagnosis not present

## 2023-10-24 DIAGNOSIS — M7989 Other specified soft tissue disorders: Secondary | ICD-10-CM | POA: Diagnosis not present

## 2023-10-31 ENCOUNTER — Ambulatory Visit (INDEPENDENT_AMBULATORY_CARE_PROVIDER_SITE_OTHER): Payer: Medicare Other | Admitting: Family Medicine

## 2023-10-31 ENCOUNTER — Encounter: Payer: Self-pay | Admitting: Family Medicine

## 2023-10-31 VITALS — BP 143/100 | HR 70 | Wt 123.0 lb

## 2023-10-31 DIAGNOSIS — S8002XD Contusion of left knee, subsequent encounter: Secondary | ICD-10-CM

## 2023-10-31 DIAGNOSIS — E871 Hypo-osmolality and hyponatremia: Secondary | ICD-10-CM | POA: Diagnosis not present

## 2023-10-31 DIAGNOSIS — I10 Essential (primary) hypertension: Secondary | ICD-10-CM

## 2023-10-31 DIAGNOSIS — R739 Hyperglycemia, unspecified: Secondary | ICD-10-CM | POA: Diagnosis not present

## 2023-10-31 DIAGNOSIS — W19XXXD Unspecified fall, subsequent encounter: Secondary | ICD-10-CM | POA: Diagnosis not present

## 2023-10-31 DIAGNOSIS — T148XXA Other injury of unspecified body region, initial encounter: Secondary | ICD-10-CM

## 2023-10-31 DIAGNOSIS — R630 Anorexia: Secondary | ICD-10-CM

## 2023-10-31 DIAGNOSIS — I4891 Unspecified atrial fibrillation: Secondary | ICD-10-CM | POA: Diagnosis not present

## 2023-10-31 DIAGNOSIS — E782 Mixed hyperlipidemia: Secondary | ICD-10-CM

## 2023-10-31 DIAGNOSIS — J449 Chronic obstructive pulmonary disease, unspecified: Secondary | ICD-10-CM | POA: Diagnosis not present

## 2023-10-31 DIAGNOSIS — W19XXXA Unspecified fall, initial encounter: Secondary | ICD-10-CM

## 2023-10-31 MED ORDER — TRIAMCINOLONE ACETONIDE 0.1 % EX CREA: 1.0000 | TOPICAL_CREAM | Freq: Two times a day (BID) | CUTANEOUS | 1 refills | Status: DC

## 2023-10-31 NOTE — Assessment & Plan Note (Signed)
Chronic and stable.

## 2023-10-31 NOTE — Assessment & Plan Note (Signed)
Reviewed last lipid panel Not currently on a statin Recheck FLP and CMP Discussed diet and exercise

## 2023-10-31 NOTE — Assessment & Plan Note (Signed)
Well controlled Continue current medications Recheck metabolic panel F/u in 6 months

## 2023-10-31 NOTE — Assessment & Plan Note (Signed)
On Eliquis and metoprolol. Reports occasional shortness of breath but no significant heart rate issues. Eliquis contributes to hematoma severity. Emphasized importance of continuing Eliquis to prevent strokes and metoprolol for heart rate control. - Continue Eliquis and metoprolol

## 2023-10-31 NOTE — Assessment & Plan Note (Signed)
Chronic and stable  CMP today

## 2023-10-31 NOTE — Progress Notes (Signed)
Established patient visit   Patient: Karen Henson   DOB: 24-Jul-1941   83 y.o. Female  MRN: [id number] Visit Date: 10/31/2023  Today's healthcare provider: Shirlee Latch, MD   Chief Complaint  Patient presents with   Medical Management of Chronic Issues   Hypertension   Fall    2 weeks ago and being sen by ortho. Reports still having some swelling and painful for patient to be on. She was given tramadol and oxycodone   Subjective    HPI HPI     Hypertension   This is a chronic problem.  The problem is controlled.  Compliance with treatment is excellent.  Lower extremity edema: Present (Left ankle, also leg that has knee swelling due to fall).        Fall    Additional comments: 2 weeks ago and being sen by ortho. Reports still having some swelling and painful for patient to be on. She was given tramadol and oxycodone      Last edited by Acey Lav, CMA on 10/31/2023  9:24 AM.       Discussed the use of AI scribe software for clinical note transcription with the patient, who gave verbal consent to proceed.  History of Present Illness   The patient, with a past medical history of hypertension, atrial fibrillation, and osteoporosis, presents for a follow-up visit after a recent fall that resulted in a knee injury. The patient reports that their knee "gave way" as they were getting out of bed, leading to the fall. The patient has seen an orthopedic specialist twice for this issue and has been given pain medication, which they are reluctant to take due to constipation. The patient reports that they are managing their pain with Tylenol and their bowels are currently functioning normally. The patient also reports that their leg feels stiff and they need to give themselves a moment to get ready to walk once they stand.  In addition to the knee injury, the patient has a history of falls and has been doing physical therapy for a shoulder injury. The patient reports  that they have not been working on leg strength or fall prevention in their physical therapy sessions. The patient also reports that they have been experiencing swelling in their other ankle, which has been ongoing.  The patient also reports that they have been experiencing itching and discomfort in two spots on their legs. These spots appeared after the patient broke their hip. The patient has been trying to manage these spots with lotion and cortisone cream.  The patient's family member reports that the patient has been eating less than usual. The patient confirms this, stating that they feel full and can't eat much. The patient's weight has been stable, but they have lost some weight compared to a year ago.         Medications: Outpatient Medications Prior to Visit  Medication Sig   acetaminophen (TYLENOL) 500 MG tablet Take 500 mg by mouth every 6 (six) hours as needed for moderate pain or headache.   albuterol (VENTOLIN HFA) 108 (90 Base) MCG/ACT inhaler Inhale 2 puffs into the lungs every 6 (six) hours as needed for wheezing or shortness of breath.   apixaban (ELIQUIS) 2.5 MG TABS tablet Take 1 tablet (2.5 mg total) by mouth 2 (two) times daily.   metoprolol succinate (TOPROL-XL) 50 MG 24 hr tablet Take 1 tablet (50 mg total) by mouth at bedtime.   montelukast (SINGULAIR) 10 MG  tablet TAKE 1 TABLET BY MOUTH BEDTIME FOR ASTHMA   oxyCODONE (OXY IR/ROXICODONE) 5 MG immediate release tablet Take 5 mg by mouth every 4 (four) hours as needed.   traMADol (ULTRAM) 50 MG tablet Take 50 mg by mouth every 6 (six) hours as needed.   [DISCONTINUED] benzonatate (TESSALON) 200 MG capsule Take 1 capsule (200 mg total) by mouth 2 (two) times daily as needed for cough. (Patient not taking: Reported on 10/31/2023)   [DISCONTINUED] pseudoephedrine-guaifenesin (MUCINEX D) 60-600 MG 12 hr tablet Take 1 tablet by mouth daily. (Patient not taking: Reported on 10/31/2023)   No facility-administered medications  prior to visit.    Review of Systems     Objective    BP (!) 143/100 (BP Location: Right Arm, Patient Position: Sitting, Cuff Size: Normal)   Pulse 70   Wt 123 lb (55.8 kg)   SpO2 100%   BMI 22.50 kg/m    Physical Exam Vitals reviewed.  Constitutional:      General: She is not in acute distress.    Appearance: Normal appearance. She is well-developed. She is not diaphoretic.  HENT:     Head: Normocephalic and atraumatic.  Eyes:     General: No scleral icterus.    Conjunctiva/sclera: Conjunctivae normal.  Neck:     Thyroid: No thyromegaly.  Cardiovascular:     Rate and Rhythm: Normal rate. Rhythm irregular.     Heart sounds: Normal heart sounds. No murmur heard. Pulmonary:     Effort: Pulmonary effort is normal. No respiratory distress.     Breath sounds: Normal breath sounds. No wheezing, rhonchi or rales.  Musculoskeletal:     Cervical back: Neck supple.     Right lower leg: No edema.     Left lower leg: Edema present.     Comments: Hematoma and swelling of L knee with dependent edema nad brusing into calf and foot  Lymphadenopathy:     Cervical: No cervical adenopathy.  Skin:    General: Skin is warm and dry.     Findings: No rash.  Neurological:     Mental Status: She is alert and oriented to person, place, and time. Mental status is at baseline.  Psychiatric:        Mood and Affect: Mood normal.        Behavior: Behavior normal.      No results found for any visits on 10/31/23.  Assessment & Plan     Problem List Items Addressed This Visit       Cardiovascular and Mediastinum   Essential hypertension - Primary   Well controlled Continue current medications Recheck metabolic panel F/u in 6 months       Relevant Orders   Comprehensive metabolic panel   Atrial fibrillation with RVR (HCC)   On Eliquis and metoprolol. Reports occasional shortness of breath but no significant heart rate issues. Eliquis contributes to hematoma severity. Emphasized  importance of continuing Eliquis to prevent strokes and metoprolol for heart rate control. 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 Orthopedics evaluated twice; x-rays were normal. Managing pain with Tylenol due to adverse effects from stronger pain medications. Discussed that hematomas take time to resorb and may appear worse before improving. Explained that a heating pad can aid resorption and elevation above the heart reduces swelling. - Advise use of heating pad to aid hematoma resorption - Elevate leg above heart level - Monitor for signs of infection  Fall Risk Multiple falls, including recent fall causing knee hematoma. No physical therapy for fall prevention. Reports leg giving way without warning. Discussed potential for home health physical therapy for fall prevention post-hematoma resolution. - Consider home health physical therapy for fall prevention post-hematoma resolution - Implement fall prevention strategies at home  Constipation Constipation secondary to pain medication use. Managing pain with Tylenol but experienced constipation with stronger pain medications. Discussed options including Colace, Miralax, and Senna. - Recommend Colace or Miralax for stool softening - Advise use of Senna if  constipation persists  Atrial Fibrillation   Osteoporosis Osteoporosis with multiple fractures, including hip fracture. Failed Fosamax. Considering Prolia injections for bone density improvement. Discussed Prolia injections every six months. Recommended rescheduling endocrinology appointment. - Reschedule endocrinology appointment to discuss Prolia injections - Consider Prolia injections every six months  Weight Loss Weight loss since hip fracture. Decreased appetite and difficulty eating large amounts. Discussed importance of calorie-dense foods like peanut butter and Greek yogurt to maintain weight and prevent health decline. - Encourage consumption of calorie-dense foods - Monitor weight and nutritional intake  General Health Maintenance Routine blood work planned, including cholesterol, kidney, liver function tests, and A1c due to occasional high blood sugar readings. - Order routine blood work including cholesterol, kidney, liver function tests - Order A1c test  Follow-up - Schedule six-month follow-up appointment - Follow up with endocrinology for osteoporosis management - Monitor lab results and adjust treatment as necessary.        Return in about 6 months (around 04/29/2024) for chronic disease f/u.      Total time spent on today's visit was greater than 40 minutes, including both face-to-face time and nonface-to-face time personally spent on review of chart (labs and imaging), discussing labs and goals, discussing further work-up, treatment options, answering patient's and family members' questions, and coordinating care.   Shirlee Latch, MD  [facility] [phone] (phone) [phone] (fax)  [facility]

## 2023-11-01 ENCOUNTER — Encounter: Payer: Self-pay | Admitting: Family Medicine

## 2023-11-01 LAB — LIPID PANEL WITH LDL/HDL RATIO
Cholesterol, Total: 145 mg/dL (ref 100–199)
HDL: 49 mg/dL (ref >39)
LDL Chol Calc (NIH): 79 mg/dL (ref 0–99)
LDL/HDL Ratio: 1.6 {ratio} (ref 0.0–3.2)
Triglycerides: 92 mg/dL (ref 0–149)
VLDL Cholesterol Cal: 17 mg/dL (ref 5–40)

## 2023-11-01 LAB — COMPREHENSIVE METABOLIC PANEL
ALT: 7 [IU]/L (ref 0–32)
AST: 15 [IU]/L (ref 0–40)
Albumin: 4.5 g/dL (ref 3.7–4.7)
Alkaline Phosphatase: 36 [IU]/L — ABNORMAL LOW (ref 44–121)
BUN/Creatinine Ratio: 14 (ref 12–28)
BUN: 11 mg/dL (ref 8–27)
Bilirubin Total: 1.8 mg/dL — ABNORMAL HIGH (ref 0.0–1.2)
CO2: 24 mmol/L (ref 20–29)
Calcium: 9.7 mg/dL (ref 8.7–10.3)
Chloride: 96 mmol/L (ref 96–106)
Creatinine, Ser: 0.76 mg/dL (ref 0.57–1.00)
Globulin, Total: 2.3 g/dL (ref 1.5–4.5)
Glucose: 91 mg/dL (ref 70–99)
Potassium: 4.8 mmol/L (ref 3.5–5.2)
Sodium: 134 mmol/L (ref 134–144)
Total Protein: 6.8 g/dL (ref 6.0–8.5)
eGFR: 79 mL/min/{1.73_m2} (ref >59)

## 2023-11-01 LAB — HEMOGLOBIN A1C
Est. average glucose Bld gHb Est-mCnc: 117 mg/dL
Hgb A1c MFr Bld: 5.7 % — ABNORMAL HIGH (ref 4.8–5.6)

## 2023-11-05 ENCOUNTER — Other Ambulatory Visit: Payer: Self-pay

## 2023-11-05 DIAGNOSIS — R17 Unspecified jaundice: Secondary | ICD-10-CM

## 2023-12-13 ENCOUNTER — Other Ambulatory Visit: Payer: Self-pay | Admitting: Nurse Practitioner

## 2023-12-13 DIAGNOSIS — I4891 Unspecified atrial fibrillation: Secondary | ICD-10-CM

## 2023-12-13 NOTE — Telephone Encounter (Signed)
 Eliquis 2.5mg  refill request received. Patient is 83 years old, weight-55.8kg, Crea-0.76 on 10/31/23, Diagnosis-Afib, and last seen by Dr. Mariah Milling on 04/15/23. Dose is appropriate based on dosing criteria. Will send in refill to requested pharmacy.

## 2023-12-16 DIAGNOSIS — D040 Carcinoma in situ of skin of lip: Secondary | ICD-10-CM | POA: Diagnosis not present

## 2023-12-16 DIAGNOSIS — D485 Neoplasm of uncertain behavior of skin: Secondary | ICD-10-CM | POA: Diagnosis not present

## 2023-12-16 DIAGNOSIS — D225 Melanocytic nevi of trunk: Secondary | ICD-10-CM | POA: Diagnosis not present

## 2023-12-16 DIAGNOSIS — D2272 Melanocytic nevi of left lower limb, including hip: Secondary | ICD-10-CM | POA: Diagnosis not present

## 2023-12-16 DIAGNOSIS — D2262 Melanocytic nevi of left upper limb, including shoulder: Secondary | ICD-10-CM | POA: Diagnosis not present

## 2023-12-16 DIAGNOSIS — D2261 Melanocytic nevi of right upper limb, including shoulder: Secondary | ICD-10-CM | POA: Diagnosis not present

## 2023-12-16 DIAGNOSIS — L2989 Other pruritus: Secondary | ICD-10-CM | POA: Diagnosis not present

## 2023-12-16 DIAGNOSIS — L570 Actinic keratosis: Secondary | ICD-10-CM | POA: Diagnosis not present

## 2023-12-18 ENCOUNTER — Ambulatory Visit: Payer: Self-pay | Admitting: Family Medicine

## 2023-12-18 NOTE — Telephone Encounter (Signed)
  Chief Complaint: Weakness Symptoms: vomiting, weakness, elevated blood pressure Frequency: onset 12/17/23 Pertinent Negatives: Patient denies chest pain Disposition: [x] ED /[] Urgent Care (no appt availability in office) / [] Appointment(In office/virtual)/ []  [facility]/ [] Home Care/ [] Refused Recommended Disposition /[] [facility]/ []  Follow-up with PCP Additional Notes:  Daughter Dawn calling. Jackeline Gutknecht developed a cough and congestion. Overnight she felt worse and began vomiting, feeling weak, no appetite. Blood pressure 153/101, took BP meds after measuring bp, she missed her dose last night. Weakness to point of unable to ambulate to the bathroom. Emergency room evaluation advised, daughter will transport, if Aalivia still to weak to ambulate they will call for ambulance transport. Care advice discussed as documented in protocol.    Copied from CRM [id number]. Topic: Clinical - Red Word Triage >> Dec 18, 2023 11:55 AM Emylou G wrote: [facility] that prompted transfer to Nurse Triage: Weak, unable to walk or eat, congestion, flu, bp is 153/101 Reason for Disposition  [1] MODERATE vomiting (e.g., 3 - 5 times/day) AND [2] age > 60 years  Protocols used: Vomiting-A-AH

## 2023-12-19 ENCOUNTER — Encounter: Payer: Self-pay | Admitting: Physician Assistant

## 2023-12-19 ENCOUNTER — Ambulatory Visit: Admitting: Physician Assistant

## 2023-12-19 VITALS — BP 141/95 | HR 75 | Temp 98.1°F | Resp 16 | Ht 62.0 in | Wt 128.0 lb

## 2023-12-19 DIAGNOSIS — J441 Chronic obstructive pulmonary disease with (acute) exacerbation: Secondary | ICD-10-CM | POA: Diagnosis not present

## 2023-12-19 MED ORDER — ALBUTEROL SULFATE HFA 108 (90 BASE) MCG/ACT IN AERS: 2.0000 | INHALATION_SPRAY | Freq: Four times a day (QID) | RESPIRATORY_TRACT | 3 refills | Status: DC | PRN

## 2023-12-19 MED ORDER — PREDNISONE 10 MG PO TABS
ORAL_TABLET | ORAL | 0 refills | Status: DC
Start: 2023-12-19 — End: 2024-02-18

## 2023-12-19 MED ORDER — LEVOFLOXACIN 750 MG PO TABS
750.0000 mg | ORAL_TABLET | Freq: Every day | ORAL | 0 refills | Status: DC
Start: 2023-12-19 — End: 2024-02-10

## 2023-12-19 NOTE — Progress Notes (Signed)
 [facility] [address] [place], [place] [address]  Pulmonary Sleep Medicine   Office Visit Note  Patient Name: Karen Henson DOB: 1941-07-26 MRN [id number]  Date of Service: 12/27/2023  Complaints/HPI: Pt is here for acute pulmonary visit. Monday started with cough and runny nose. Tuesday night started vomiting, but this has resolved. Some weakness yesterday, but doing a little better today. Main symptoms now are cough and wheezing with some SOB. Did use albuterol and this helped some. Did try mucinex on Tuesday but not since then. Covid test was negative.  ROS  General: (-) fever, (-) chills, (-) night sweats, (-) weakness Skin: (-) rashes, (-) itching,. Eyes: (-) visual changes, (-) redness, (-) itching. Nose and Sinuses: (-) nasal stuffiness or itchiness, (-) postnasal drip, (-) nosebleeds, (-) sinus trouble. Mouth and Throat: (-) sore throat, (-) hoarseness. Neck: (-) swollen glands, (-) enlarged thyroid, (-) neck pain. Respiratory: + cough, (-) bloody sputum, + shortness of breath, + wheezing. Cardiovascular: - ankle swelling, (-) chest pain. Lymphatic: (-) lymph node enlargement. Neurologic: (-) numbness, (-) tingling. Psychiatric: (-) anxiety, (-) depression   Current Medication: Outpatient Encounter Medications as of 12/19/2023  Medication Sig   acetaminophen (TYLENOL) 500 MG tablet Take 500 mg by mouth every 6 (six) hours as needed for moderate pain or headache.   apixaban (ELIQUIS) 2.5 MG TABS tablet TAKE 1 TABLET BY MOUTH TWICE DAILY   levofloxacin (LEVAQUIN) 750 MG tablet Take 1 tablet (750 mg total) by mouth daily.   metoprolol succinate (TOPROL-XL) 50 MG 24 hr tablet TAKE 1 TABLET BY MOUTH AT BEDTIME   montelukast (SINGULAIR) 10 MG tablet TAKE 1 TABLET BY MOUTH BEDTIME FOR ASTHMA   oxyCODONE (OXY IR/ROXICODONE) 5 MG immediate release tablet Take 5 mg by mouth every 4 (four) hours as needed.   predniSONE (DELTASONE) 10 MG tablet Take one tab 3 x day  for 3 days, then take one tab 2 x a day for 3 days and then take one tab a day for 3 days for copd   traMADol (ULTRAM) 50 MG tablet Take 50 mg by mouth every 6 (six) hours as needed.   triamcinolone cream (KENALOG) 0.1 % Apply 1 Application topically 2 (two) times daily.   [DISCONTINUED] albuterol (VENTOLIN HFA) 108 (90 Base) MCG/ACT inhaler Inhale 2 puffs into the lungs every 6 (six) hours as needed for wheezing or shortness of breath.   [DISCONTINUED] albuterol (VENTOLIN HFA) 108 (90 Base) MCG/ACT inhaler Inhale 2 puffs into the lungs every 6 (six) hours as needed for wheezing or shortness of breath.   No facility-administered encounter medications on file as of 12/19/2023.    Surgical History: Past Surgical History:  Procedure Laterality Date   BREAST EXCISIONAL BIOPSY Left 1996   neg   BREAST SURGERY     CAPSULOTOMY     CATARACT EXTRACTION W/PHACO Left 10/31/2017   Procedure: CATARACT EXTRACTION PHACO AND INTRAOCULAR LENS PLACEMENT (IOC);  Surgeon: Nevada Crane, MD;  Location: [facility] ORS;  Service: Ophthalmology;  Laterality: Left;  [place] 00:43.7 AP% 19.6 CDE 8.57 FLUID PACK LOT # [id number]   CATARACT EXTRACTION W/PHACO Right 12/05/2017   Procedure: CATARACT EXTRACTION PHACO AND INTRAOCULAR LENS PLACEMENT (IOC);  Surgeon: Nevada Crane, MD;  Location: [facility] ORS;  Service: Ophthalmology;  Laterality: Right;  fluid pack lot #[id number]  exp 07/01/2019 [place]    00:31.4 AP%    8.8 CDE    2.79   COLONOSCOPY  02/15/2006   COLONOSCOPY WITH PROPOFOL N/A  03/21/2016   Procedure: COLONOSCOPY WITH PROPOFOL;  Surgeon: Earline Mayotte, MD;  Location: [facility] ENDOSCOPY;  Service: Endoscopy;  Laterality: N/A;   COLONOSCOPY WITH PROPOFOL N/A 03/29/2021   Procedure: COLONOSCOPY WITH PROPOFOL;  Surgeon: Earline Mayotte, MD;  Location: [facility] ENDOSCOPY;  Service: Endoscopy;  Laterality: N/A;   DILATION AND CURETTAGE OF UTERUS     EYE SURGERY     HIP ARTHROPLASTY Right 02/10/2023   Procedure:  ARTHROPLASTY BIPOLAR HIP (HEMIARTHROPLASTY);  Surgeon: Signa Kell, MD;  Location: [facility] ORS;  Service: Orthopedics;  Laterality: Right;   VARICOSE VEIN SURGERY      Medical History: Past Medical History:  Diagnosis Date   Allergy    Environmental Allergies   Arthritis    Bronchitis, chronic (HCC)    Closed right hip fracture (HCC) 02/09/2023   COPD (chronic obstructive pulmonary disease) (HCC)    Cough    CHRONIC   Dyspnea    OCCAS   Hypertension    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HCC)    Osteoporosis    Persistent atrial fibrillation (HCC)    a. Dx 01/2023 in setting of hip fx->rate controlled w/ bb. CHA2DS2VASc = 4-->eliquis 2.5 bid.   Tricuspid regurgitation    a. 01/2023 Echo: EF 55-60%, no rwma, nl RV fxn, RVSP . Mod dil RA. Mild MR. Mod-Sev TR.    Family History: Family History  Problem Relation Age of Onset   Hypertension Mother    Emphysema Father    Stroke Sister    Stroke Brother    Diabetes Maternal Aunt    Diabetes Maternal Grandmother    Lung cancer Grandchild    Breast cancer Neg Hx    Ovarian cancer Neg Hx    Colon cancer Neg Hx     Social History: Social History   Socioeconomic History   Marital status: Widowed    Spouse name: Not on file   Number of children: 2   Years of education: Not on file   Highest education level: 11th grade  Occupational History   Occupation: retired  Tobacco Use   Smoking status: Former    Current packs/day: 0.00    Average packs/day: 0.1 packs/day for 10.0 years (1.0 ttl pk-yrs)    Types: Cigarettes    Start date: 36    Quit date: 1985    Years since quitting: 40.2   Smokeless tobacco: Never   Tobacco comments:    smoked a pack a week, quit about 30 years ago (in 1985)  Vaping Use   Vaping status: Never Used  Substance and Sexual Activity   Alcohol use: Yes    Comment: ocassional- monthly or less, 1 glass of wine or beer   Drug use: No   Sexual activity: Never  Other Topics Concern   Not on file  Social History  Narrative   Not on file   Social Drivers of Health   Financial Resource Strain: Low Risk  (09/11/2023)   Overall Financial Resource Strain (CARDIA)    Difficulty of Paying Living Expenses: Not hard at all  Food Insecurity: No Food Insecurity (09/11/2023)   Hunger Vital Sign    Worried About Running Out of Food in the Last Year: Never true    Ran Out of Food in the Last Year: Never true  Transportation Needs: No Transportation Needs (09/11/2023)   PRAPARE - Administrator, Civil Service (Medical): No    Lack of Transportation (Non-Medical): No  Physical Activity: Insufficiently Active (09/11/2023)   Exercise  Vital Sign    Days of Exercise per Week: 4 days    Minutes of Exercise per Session: 20 min  Stress: No Stress Concern Present (09/11/2023)   [employer] of Occupational Health - Occupational Stress Questionnaire    Feeling of Stress : Not at all  Social Connections: Moderately Integrated (09/11/2023)   Social Connection and Isolation Panel [NHANES]    Frequency of Communication with Friends and Family: More than three times a week    Frequency of Social Gatherings with Friends and Family: More than three times a week    Attends Religious Services: More than 4 times per year    Active Member of [employer] or Organizations: Yes    Attends Banker Meetings: More than 4 times per year    Marital Status: Widowed  Intimate Partner Violence: Not At Risk (09/11/2023)   Humiliation, Afraid, Rape, and Kick questionnaire    Fear of Current or Ex-Partner: No    Emotionally Abused: No    Physically Abused: No    Sexually Abused: No    Vital Signs: Blood pressure (!) 141/95, pulse 75, temperature 98.1 F (36.7 C), resp. rate 16, height 5\' 2"  (1.575 m), weight 128 lb (58.1 kg), SpO2 99%.  Examination: General Appearance: The patient is well-developed, well-nourished, and in no distress. Skin: Gross inspection of skin unremarkable. Head: normocephalic, no  gross deformities. Eyes: no gross deformities noted. ENT: ears appear grossly normal no exudates. Neck: Supple. No thyromegaly. No LAD. Respiratory: Rhonchi present. Cardiovascular: Normal S1 and S2 without murmur or rub. Extremities: No cyanosis. pulses are equal. Neurologic: Alert and oriented. No involuntary movements.  LABS: Recent Results (from the past 2160 hours)  Comprehensive metabolic panel     Status: Abnormal   Collection Time: 10/31/23 10:04 AM  Result Value Ref Range   Glucose 91 70 - 99 mg/dL   BUN 11 8 - 27 mg/dL   Creatinine, Ser 1.61 0.57 - 1.00 mg/dL   eGFR 79 >09 UE/AVW/0.98   BUN/Creatinine Ratio 14 12 - 28   Sodium 134 134 - 144 mmol/L   Potassium 4.8 3.5 - 5.2 mmol/L   Chloride 96 96 - 106 mmol/L   CO2 24 20 - 29 mmol/L   Calcium 9.7 8.7 - 10.3 mg/dL   Total Protein 6.8 6.0 - 8.5 g/dL   Albumin 4.5 3.7 - 4.7 g/dL   Globulin, Total 2.3 1.5 - 4.5 g/dL   Bilirubin Total 1.8 (H) 0.0 - 1.2 mg/dL   Alkaline Phosphatase 36 (L) 44 - 121 IU/L   AST 15 0 - 40 IU/L   ALT 7 0 - 32 IU/L  Lipid Panel With LDL/HDL Ratio     Status: None   Collection Time: 10/31/23 10:04 AM  Result Value Ref Range   Cholesterol, Total 145 100 - 199 mg/dL   Triglycerides 92 0 - 149 mg/dL   HDL 49 >11 mg/dL   VLDL Cholesterol Cal 17 5 - 40 mg/dL   LDL Chol Calc (NIH) 79 0 - 99 mg/dL   LDL/HDL Ratio 1.6 0.0 - 3.2 ratio    Comment:                                     LDL/HDL Ratio  Men  Women                               1/2 Avg.Risk  1.0    1.5                                   Avg.Risk  3.6    3.2                                2X Avg.Risk  6.2    5.0                                3X Avg.Risk  8.0    6.1   Hemoglobin A1c     Status: Abnormal   Collection Time: 10/31/23 10:04 AM  Result Value Ref Range   Hgb A1c MFr Bld 5.7 (H) 4.8 - 5.6 %    Comment:          Prediabetes: 5.7 - 6.4          Diabetes: >6.4          Glycemic control  for adults with diabetes: <7.0    Est. average glucose Bld gHb Est-mCnc 117 mg/dL    Radiology: US Venous Img Lower Unilateral Left (DVT) Result Date: 10/24/2023 CLINICAL DATA:  Leg swelling. EXAM: Left LOWER EXTREMITY VENOUS DOPPLER ULTRASOUND TECHNIQUE: Gray-scale sonography with compression, as well as color and duplex ultrasound, were performed to evaluate the deep venous system(s) from the level of the common femoral vein through the popliteal and proximal calf veins. COMPARISON:  None Available. FINDINGS: VENOUS Normal compressibility of the common femoral, superficial femoral, and popliteal veins, as well as the visualized calf veins. Visualized portions of profunda femoral vein and great saphenous vein unremarkable. No filling defects to suggest DVT on grayscale or color Doppler imaging. Doppler waveforms show normal direction of venous flow, normal respiratory plasticity and response to augmentation. Limited views of the contralateral common femoral vein are unremarkable. OTHER Soft tissue edema. Limitations: none IMPRESSION: No evidence of left lower extremity DVT. Electronically Signed   By: Karen Kays M.D.   On: 10/24/2023 15:09    No results found.  DG Chest 2 View Result Date: 12/23/2023 CLINICAL DATA:  Coughing, wheezing and shortness of breath for 4 days. History of COPD. EXAM: CHEST - 2 VIEW COMPARISON:  Radiographs 02/09/2023 and 10/23/2021.  CT 07/27/2008. FINDINGS: The heart size and mediastinal contours are stable with moderate cardiac enlargement and aortic atherosclerosis. The lungs are hyperinflated but clear. No pleural effusion or pneumothorax. No acute osseous findings are evident. Pectus deformity of the sternum and mild thoracic spondylosis noted. IMPRESSION: No evidence of acute cardiopulmonary process. Stable cardiomegaly and pulmonary hyperinflation. Electronically Signed   By: Carey Bullocks M.D.   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 COPD with acute exacerbation (HCC) (Primary) Will go ahead and treat with levaquin and prednisone and may continue inhaler. Oxygen stable in office. Advised if not improving will need chest xray. Pt has follow up on Monday already scheduled and will keep this to follow up on how she is doing. Advised to go to ED if new or worsening symptoms. - levofloxacin (LEVAQUIN) 750 MG tablet; Take 1 tablet (750 mg total) by mouth daily.  Dispense: 7 tablet; Refill: 0 - predniSONE (DELTASONE) 10 MG tablet; Take one tab 3 x day for 3 days, then take one tab 2 x a day for 3 days and then take one tab a day for 3 days for copd  Dispense: 18 tablet; Refill: 0   General Counseling: I have discussed the findings of the evaluation and examination with Blakely.  I have also discussed any further diagnostic evaluation thatmay be needed or ordered today. Kimiko verbalizes understanding of the findings of todays visit. We also reviewed her medications today and discussed drug interactions and side effects including but not limited excessive drowsiness and altered mental states. We also discussed that there is always a risk not just to her but also people around her. she has been  encouraged to call the office with any questions or concerns that should arise related to todays visit.  No orders of the defined types were placed in this encounter.    Time spent: 30  I have personally obtained a history, examined the patient, evaluated laboratory and imaging results, formulated the assessment and plan and placed orders. This patient was seen by Lynn Ito, PA-C in collaboration with Dr. Freda Munro as a part of collaborative care agreement.     Yevonne Pax, MD [facility] Pulmonary and Critical Care Sleep medicine

## 2023-12-19 NOTE — Telephone Encounter (Signed)
 Pt seen by pulmonology today and prescribed antibiotics and prednisone.

## 2023-12-23 ENCOUNTER — Telehealth: Payer: Self-pay

## 2023-12-23 ENCOUNTER — Encounter: Payer: Self-pay | Admitting: Physician Assistant

## 2023-12-23 ENCOUNTER — Ambulatory Visit
Admission: RE | Admit: 2023-12-23 | Discharge: 2023-12-23 | Disposition: A | Discharge status: Home / Self Care | Attending: Physician Assistant | Admitting: Physician Assistant

## 2023-12-23 ENCOUNTER — Ambulatory Visit
Admission: RE | Admit: 2023-12-23 | Discharge: 2023-12-23 | Disposition: A | Discharge status: Home / Self Care | Source: Ambulatory Visit | Attending: Physician Assistant | Admitting: Physician Assistant

## 2023-12-23 ENCOUNTER — Ambulatory Visit (INDEPENDENT_AMBULATORY_CARE_PROVIDER_SITE_OTHER): Payer: Medicare Other | Admitting: Physician Assistant

## 2023-12-23 VITALS — BP 130/70 | HR 93 | Temp 98.2°F | Resp 16 | Ht 62.0 in | Wt 128.0 lb

## 2023-12-23 DIAGNOSIS — J449 Chronic obstructive pulmonary disease, unspecified: Secondary | ICD-10-CM | POA: Diagnosis not present

## 2023-12-23 DIAGNOSIS — R062 Wheezing: Secondary | ICD-10-CM | POA: Diagnosis not present

## 2023-12-23 DIAGNOSIS — R0602 Shortness of breath: Secondary | ICD-10-CM

## 2023-12-23 DIAGNOSIS — J441 Chronic obstructive pulmonary disease with (acute) exacerbation: Secondary | ICD-10-CM

## 2023-12-23 DIAGNOSIS — I4891 Unspecified atrial fibrillation: Secondary | ICD-10-CM | POA: Diagnosis not present

## 2023-12-23 DIAGNOSIS — R059 Cough, unspecified: Secondary | ICD-10-CM | POA: Diagnosis not present

## 2023-12-23 MED ORDER — IPRATROPIUM-ALBUTEROL 0.5-2.5 (3) MG/3ML IN SOLN
3.0000 mL | RESPIRATORY_TRACT | Status: DC
Start: 2023-12-23 — End: 2023-12-23

## 2023-12-23 MED ORDER — IPRATROPIUM-ALBUTEROL 0.5-2.5 (3) MG/3ML IN SOLN
3.0000 mL | RESPIRATORY_TRACT | 1 refills | Status: DC | PRN
Start: 2023-12-23 — End: 2023-12-23

## 2023-12-23 MED ORDER — ALBUTEROL SULFATE HFA 108 (90 BASE) MCG/ACT IN AERS: 2.0000 | INHALATION_SPRAY | Freq: Four times a day (QID) | RESPIRATORY_TRACT | 3 refills | Status: DC | PRN

## 2023-12-23 MED ORDER — IPRATROPIUM-ALBUTEROL 0.5-2.5 (3) MG/3ML IN SOLN
3.0000 mL | Freq: Four times a day (QID) | RESPIRATORY_TRACT | 1 refills | Status: AC | PRN
Start: 2023-12-23 — End: ?

## 2023-12-23 NOTE — Telephone Encounter (Signed)
 Sent message to Karen Henson that we gave pt nebulizer at visit and order in epic

## 2023-12-23 NOTE — Telephone Encounter (Signed)
Spoke with patient regarding chest x ray results. 

## 2023-12-23 NOTE — Progress Notes (Signed)
 [facility] [address] [place], [place] [address]  Pulmonary Sleep Medicine   Office Visit Note  Patient Name: Karen Henson DOB: 11-22-1940 MRN [id number]  Date of Service: 12/23/2023  Complaints/HPI: Pt is here for routine pulmonary follow up. Seen last week for acute pulmonary visit. Likely started as viral infection as she had vomiting initially as well. Was neg for covid but did not flu test. Reduced appetite still, but still able to eat and drink fluids. Sleeping in recliner due to coughing and wheezing. Hx of Afib but denies hx of CHF. Does not have any ankle swelling in office. Does need cardiology follow up in general though and will schedule. Will complete Abx and prednisone started on Thursday. Duoneb given in office and pt does report symptoms improved some after. Will give neb to use at home and get chest xray. Oxygen stable in office, but advised if new or worsening symptoms to go to ED.  ROS  General: (-) fever, (-) chills, (-) night sweats, (-) weakness Skin: (-) rashes, (-) itching,. Eyes: (-) visual changes, (-) redness, (-) itching. Nose and Sinuses: (-) nasal stuffiness or itchiness, (-) postnasal drip, (-) nosebleeds, (-) sinus trouble. Mouth and Throat: (-) sore throat, (-) hoarseness. Neck: (-) swollen glands, (-) enlarged thyroid, (-) neck pain. Respiratory: + cough, (-) bloody sputum, + shortness of breath, + wheezing. Cardiovascular: - ankle swelling, (-) chest pain. Lymphatic: (-) lymph node enlargement. Neurologic: (-) numbness, (-) tingling. Psychiatric: (-) anxiety, (-) depression   Current Medication: Outpatient Encounter Medications as of 12/23/2023  Medication Sig   acetaminophen (TYLENOL) 500 MG tablet Take 500 mg by mouth every 6 (six) hours as needed for moderate pain or headache.   apixaban (ELIQUIS) 2.5 MG TABS tablet TAKE 1 TABLET BY MOUTH TWICE DAILY   levofloxacin (LEVAQUIN) 750 MG tablet Take 1 tablet (750 mg total) by mouth  daily.   metoprolol succinate (TOPROL-XL) 50 MG 24 hr tablet TAKE 1 TABLET BY MOUTH AT BEDTIME   montelukast (SINGULAIR) 10 MG tablet TAKE 1 TABLET BY MOUTH BEDTIME FOR ASTHMA   oxyCODONE (OXY IR/ROXICODONE) 5 MG immediate release tablet Take 5 mg by mouth every 4 (four) hours as needed.   predniSONE (DELTASONE) 10 MG tablet Take one tab 3 x day for 3 days, then take one tab 2 x a day for 3 days and then take one tab a day for 3 days for copd   traMADol (ULTRAM) 50 MG tablet Take 50 mg by mouth every 6 (six) hours as needed.   triamcinolone cream (KENALOG) 0.1 % Apply 1 Application topically 2 (two) times daily.   [DISCONTINUED] albuterol (VENTOLIN HFA) 108 (90 Base) MCG/ACT inhaler Inhale 2 puffs into the lungs every 6 (six) hours as needed for wheezing or shortness of breath.   [DISCONTINUED] ipratropium-albuterol (DUONEB) 0.5-2.5 (3) MG/3ML SOLN Take 3 mLs by nebulization every 4 (four) hours as needed.   albuterol (VENTOLIN HFA) 108 (90 Base) MCG/ACT inhaler Inhale 2 puffs into the lungs every 6 (six) hours as needed for wheezing or shortness of breath.   ipratropium-albuterol (DUONEB) 0.5-2.5 (3) MG/3ML SOLN Take 3 mLs by nebulization every 6 (six) hours as needed.   [DISCONTINUED] ipratropium-albuterol (DUONEB) 0.5-2.5 (3) MG/3ML nebulizer solution 3 mL    No facility-administered encounter medications on file as of 12/23/2023.    Surgical History: Past Surgical History:  Procedure Laterality Date   BREAST EXCISIONAL BIOPSY Left 1996   neg   BREAST SURGERY  CAPSULOTOMY     CATARACT EXTRACTION W/PHACO Left 10/31/2017   Procedure: CATARACT EXTRACTION PHACO AND INTRAOCULAR LENS PLACEMENT (IOC);  Surgeon: [place] Crane, MD;  Location: [facility] ORS;  Service: Ophthalmology;  Laterality: Left;  [place] 00:43.7 AP% 19.6 CDE 8.57 FLUID PACK LOT # [id number]   CATARACT EXTRACTION W/PHACO Right 12/05/2017   Procedure: CATARACT EXTRACTION PHACO AND INTRAOCULAR LENS PLACEMENT (IOC);  Surgeon:  [place] Crane, MD;  Location: [facility] ORS;  Service: Ophthalmology;  Laterality: Right;  fluid pack lot #[id number]  exp 07/01/2019 [place]    00:31.4 AP%    8.8 CDE    2.79   COLONOSCOPY  02/15/2006   COLONOSCOPY WITH PROPOFOL N/A 03/21/2016   Procedure: COLONOSCOPY WITH PROPOFOL;  Surgeon: Earline Mayotte, MD;  Location: [facility] ENDOSCOPY;  Service: Endoscopy;  Laterality: N/A;   COLONOSCOPY WITH PROPOFOL N/A 03/29/2021   Procedure: COLONOSCOPY WITH PROPOFOL;  Surgeon: Earline Mayotte, MD;  Location: [facility] ENDOSCOPY;  Service: Endoscopy;  Laterality: N/A;   DILATION AND CURETTAGE OF UTERUS     EYE SURGERY     HIP ARTHROPLASTY Right 02/10/2023   Procedure: ARTHROPLASTY BIPOLAR HIP (HEMIARTHROPLASTY);  Surgeon: Signa Kell, MD;  Location: [facility] ORS;  Service: Orthopedics;  Laterality: Right;   VARICOSE VEIN SURGERY      Medical History: Past Medical History:  Diagnosis Date   Allergy    Environmental Allergies   Arthritis    Bronchitis, chronic (HCC)    Closed right hip fracture (HCC) 02/09/2023   COPD (chronic obstructive pulmonary disease) (HCC)    Cough    CHRONIC   Dyspnea    OCCAS   Hypertension    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HCC)    Osteoporosis    Persistent atrial fibrillation (HCC)    a. Dx 01/2023 in setting of hip fx->rate controlled w/ bb. CHA2DS2VASc = 4-->eliquis 2.5 bid.   Tricuspid regurgitation    a. 01/2023 Echo: EF 55-60%, no rwma, nl RV fxn, RVSP . Mod dil RA. Mild MR. Mod-Sev TR.    Family History: Family History  Problem Relation Age of Onset   Hypertension Mother    Emphysema Father    Stroke Sister    Stroke Brother    Diabetes Maternal Aunt    Diabetes Maternal Grandmother    Lung cancer Grandchild    Breast cancer Neg Hx    Ovarian cancer Neg Hx    Colon cancer Neg Hx     Social History: Social History   Socioeconomic History   Marital status: Widowed    Spouse name: Not on file   Number of children: 2   Years of education: Not on file   Highest  education level: 11th grade  Occupational History   Occupation: retired  Tobacco Use   Smoking status: Former    Current packs/day: 0.00    Average packs/day: 0.1 packs/day for 10.0 years (1.0 ttl pk-yrs)    Types: Cigarettes    Start date: 28    Quit date: 1985    Years since quitting: 40.2   Smokeless tobacco: Never   Tobacco comments:    smoked a pack a week, quit about 30 years ago (in 1985)  Vaping Use   Vaping status: Never Used  Substance and Sexual Activity   Alcohol use: Yes    Comment: ocassional- monthly or less, 1 glass of wine or beer   Drug use: No   Sexual activity: Never  Other Topics Concern   Not on file  Social History  Narrative   Not on file   Social Drivers of Health   Financial Resource Strain: Low Risk  (09/11/2023)   Overall Financial Resource Strain (CARDIA)    Difficulty of Paying Living Expenses: Not hard at all  Food Insecurity: No Food Insecurity (09/11/2023)   Hunger Vital Sign    Worried About Running Out of Food in the Last Year: Never true    Ran Out of Food in the Last Year: Never true  Transportation Needs: No Transportation Needs (09/11/2023)   PRAPARE - Administrator, Civil Service (Medical): No    Lack of Transportation (Non-Medical): No  Physical Activity: Insufficiently Active (09/11/2023)   Exercise Vital Sign    Days of Exercise per Week: 4 days    Minutes of Exercise per Session: 20 min  Stress: No Stress Concern Present (09/11/2023)   [employer] of Occupational Health - Occupational Stress Questionnaire    Feeling of Stress : Not at all  Social Connections: Moderately Integrated (09/11/2023)   Social Connection and Isolation Panel [NHANES]    Frequency of Communication with Friends and Family: More than three times a week    Frequency of Social Gatherings with Friends and Family: More than three times a week    Attends Religious Services: More than 4 times per year    Active Member of [employer] or  Organizations: Yes    Attends Banker Meetings: More than 4 times per year    Marital Status: Widowed  Intimate Partner Violence: Not At Risk (09/11/2023)   Humiliation, Afraid, Rape, and Kick questionnaire    Fear of Current or Ex-Partner: No    Emotionally Abused: No    Physically Abused: No    Sexually Abused: No    Vital Signs: Blood pressure 130/70, pulse 93, temperature 98.2 F (36.8 C), resp. rate 16, height 5\' 2"  (1.575 m), weight 128 lb (58.1 kg), SpO2 98%.  Examination: General Appearance: The patient is well-developed, well-nourished, and in no distress. Skin: Gross inspection of skin unremarkable. Head: normocephalic, no gross deformities. Eyes: no gross deformities noted. ENT: ears appear grossly normal no exudates. Neck: Supple. No thyromegaly. No LAD. Respiratory: Rhonchi and wheezing on exam. Cardiovascular: Normal S1 and S2 without murmur or rub. Extremities: No cyanosis. pulses are equal. Neurologic: Alert and oriented. No involuntary movements.  LABS: Recent Results (from the past 2160 hours)  Comprehensive metabolic panel     Status: Abnormal   Collection Time: 10/31/23 10:04 AM  Result Value Ref Range   Glucose 91 70 - 99 mg/dL   BUN 11 8 - 27 mg/dL   Creatinine, Ser 2.70 0.57 - 1.00 mg/dL   eGFR 79 >35 KK/XFG/1.82   BUN/Creatinine Ratio 14 12 - 28   Sodium 134 134 - 144 mmol/L   Potassium 4.8 3.5 - 5.2 mmol/L   Chloride 96 96 - 106 mmol/L   CO2 24 20 - 29 mmol/L   Calcium 9.7 8.7 - 10.3 mg/dL   Total Protein 6.8 6.0 - 8.5 g/dL   Albumin 4.5 3.7 - 4.7 g/dL   Globulin, Total 2.3 1.5 - 4.5 g/dL   Bilirubin Total 1.8 (H) 0.0 - 1.2 mg/dL   Alkaline Phosphatase 36 (L) 44 - 121 IU/L   AST 15 0 - 40 IU/L   ALT 7 0 - 32 IU/L  Lipid Panel With LDL/HDL Ratio     Status: None   Collection Time: 10/31/23 10:04 AM  Result Value Ref Range   Cholesterol, Total  145 100 - 199 mg/dL   Triglycerides 92 0 - 149 mg/dL   HDL 49 >16 mg/dL   VLDL  Cholesterol Cal 17 5 - 40 mg/dL   LDL Chol Calc (NIH) 79 0 - 99 mg/dL   LDL/HDL Ratio 1.6 0.0 - 3.2 ratio    Comment:                                     LDL/HDL Ratio                                             Men  Women                               1/2 Avg.Risk  1.0    1.5                                   Avg.Risk  3.6    3.2                                2X Avg.Risk  6.2    5.0                                3X Avg.Risk  8.0    6.1   Hemoglobin A1c     Status: Abnormal   Collection Time: 10/31/23 10:04 AM  Result Value Ref Range   Hgb A1c MFr Bld 5.7 (H) 4.8 - 5.6 %    Comment:          Prediabetes: 5.7 - 6.4          Diabetes: >6.4          Glycemic control for adults with diabetes: <7.0    Est. average glucose Bld gHb Est-mCnc 117 mg/dL    Radiology: US Venous Img Lower Unilateral Left (DVT) Result Date: 10/24/2023 CLINICAL DATA:  Leg swelling. EXAM: Left LOWER EXTREMITY VENOUS DOPPLER ULTRASOUND TECHNIQUE: Gray-scale sonography with compression, as well as color and duplex ultrasound, were performed to evaluate the deep venous system(s) from the level of the common femoral vein through the popliteal and proximal calf veins. COMPARISON:  None Available. FINDINGS: VENOUS Normal compressibility of the common femoral, superficial femoral, and popliteal veins, as well as the visualized calf veins. Visualized portions of profunda femoral vein and great saphenous vein unremarkable. No filling defects to suggest DVT on grayscale or color Doppler imaging. Doppler waveforms show normal direction of venous flow, normal respiratory plasticity and response to augmentation. Limited views of the contralateral common femoral vein are unremarkable. OTHER Soft tissue edema. Limitations: none IMPRESSION: No evidence of left lower extremity DVT. Electronically Signed   By: Karen Kays M.D.   On: 10/24/2023 15:09    DG Chest 2 View Result Date: 12/23/2023 CLINICAL DATA:  Coughing, wheezing and  shortness of breath for 4 days. History of COPD. EXAM: CHEST - 2 VIEW COMPARISON:  Radiographs 02/09/2023 and 10/23/2021.  CT 07/27/2008. FINDINGS: The heart size and mediastinal contours are stable with moderate cardiac enlargement and  aortic atherosclerosis. The lungs are hyperinflated but clear. No pleural effusion or pneumothorax. No acute osseous findings are evident. Pectus deformity of the sternum and mild thoracic spondylosis noted. IMPRESSION: No evidence of acute cardiopulmonary process. Stable cardiomegaly and pulmonary hyperinflation. Electronically Signed   By: Carey Bullocks M.D.   On: 12/23/2023 12:34    DG Chest 2 View Result Date: 12/23/2023 CLINICAL DATA:  Coughing, wheezing and shortness of breath for 4 days. History of COPD. EXAM: CHEST - 2 VIEW COMPARISON:  Radiographs 02/09/2023 and 10/23/2021.  CT 07/27/2008. FINDINGS: The heart size and mediastinal contours are stable with moderate cardiac enlargement and aortic atherosclerosis. The lungs are hyperinflated but clear. No pleural effusion or pneumothorax. No acute osseous findings are evident. Pectus deformity of the sternum and mild thoracic spondylosis noted. IMPRESSION: No evidence of acute cardiopulmonary process. Stable cardiomegaly and pulmonary hyperinflation. Electronically Signed   By: Carey Bullocks M.D.   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 COPD with acute exacerbation (HCC) (Primary) Given duoneb treatment in office and will give nebulizer for home use as needed. Advised to complete ABX and prednisone and will get chest xray - DG Chest 2 View; Future - For home use only DME Nebulizer machine - ipratropium-albuterol (DUONEB) 0.5-2.5 (3) MG/3ML SOLN; Take 3 mLs by nebulization every 6 (six) hours as needed.  Dispense: 360 mL; Refill: 1  2. SOB (shortness of breath) Continue Levaquin and prednisone and may use neb as needed, Advised to go to ED if new or worsening symptoms arise.  3. Atrial fibrillation, unspecified type (HCC) Will need follow up with cardiology    General Counseling: I have discussed the findings of the evaluation and examination with Anyae.  I have also discussed any further diagnostic evaluation thatmay be needed or ordered today. Tisha verbalizes understanding of the findings of todays visit. We also reviewed her medications today and discussed drug interactions and side effects including but not limited excessive drowsiness and altered mental states. We also discussed that there is always a risk not just to her but also people around her. she has been encouraged to call the office with any questions or concerns that should arise related to todays visit.  Orders Placed This Encounter  Procedures   For home use only DME Nebulizer machine    Patient needs a nebulizer to treat with the following condition:   COPD (chronic obstructive pulmonary disease) (HCC) [829562]    Length of Need:   Lifetime    Additional equipment included:   Administration kit    Additional equipment included:   Filter   DG Chest 2 View    Standing Status:   Future    Number of Occurrences:   1    Expiration Date:  12/22/2024    Reason  for Exam (SYMPTOM  OR DIAGNOSIS REQUIRED):   coughing, wheezing, SOB    Preferred imaging location?:   [facility]     Time spent: 80  I have personally obtained a history, examined the patient, evaluated laboratory and imaging results, formulated the assessment and plan and placed orders. This patient was seen by Lynn Ito, PA-C in collaboration with Dr. Freda Munro as a part of collaborative care agreement.     Yevonne Pax, MD [facility] Pulmonary and Critical Care Sleep medicine

## 2023-12-23 NOTE — Telephone Encounter (Signed)
-----   Message from Carlean Jews sent at 12/23/2023  1:01 PM EDT ----- Please let her know that chest xray did not show any acute findings, continue ABX and prednisone as discussed and utilize neb as needed

## 2023-12-31 ENCOUNTER — Ambulatory Visit (INDEPENDENT_AMBULATORY_CARE_PROVIDER_SITE_OTHER): Admitting: Internal Medicine

## 2023-12-31 ENCOUNTER — Encounter: Payer: Self-pay | Admitting: Internal Medicine

## 2023-12-31 VITALS — BP 119/75 | HR 94 | Temp 98.4°F | Resp 16 | Ht 62.0 in | Wt 120.6 lb

## 2023-12-31 DIAGNOSIS — J440 Chronic obstructive pulmonary disease with acute lower respiratory infection: Secondary | ICD-10-CM

## 2023-12-31 DIAGNOSIS — J84112 Idiopathic pulmonary fibrosis: Secondary | ICD-10-CM

## 2023-12-31 DIAGNOSIS — I4891 Unspecified atrial fibrillation: Secondary | ICD-10-CM

## 2023-12-31 MED ORDER — BREZTRI AEROSPHERE 160-9-4.8 MCG/ACT IN AERO: 2.0000 | INHALATION_SPRAY | Freq: Two times a day (BID) | RESPIRATORY_TRACT | 11 refills | Status: DC

## 2023-12-31 MED ORDER — BREZTRI AEROSPHERE 160-9-4.8 MCG/ACT IN AERO
2.0000 | INHALATION_SPRAY | Freq: Two times a day (BID) | RESPIRATORY_TRACT | 11 refills | Status: AC
Start: 2023-12-31 — End: ?

## 2023-12-31 NOTE — Patient Instructions (Signed)

## 2023-12-31 NOTE — Progress Notes (Unsigned)
 [facility] [address] [place], [place] [address]  Pulmonary Sleep Medicine   Office Visit Note  Patient Name: Karen Henson DOB: 06/26/1941 MRN [id number]  Date of Service: 12/31/2023  Complaints/HPI: She has had a cough and some shortness of breath. CXR was done and this shows no infection. She was on abx also as well as prednisone. She also has a neb. She was given samples of breztri but is not using it. She states she is starting to feel better. Still has baseline shortness breath which is been a gradual decline over time.  My main concern with her is her falling and I have given her recommendations on how to prevent this.  In addition to that I did speak with the daughters at the bedside on and she definitely needs to be very careful with following episodes as this may very well not go so well the next time  Office Spirometry Results:     ROS  General: (-) fever, (-) chills, (-) night sweats, (-) weakness Skin: (-) rashes, (-) itching,. Eyes: (-) visual changes, (-) redness, (-) itching. Nose and Sinuses: (-) nasal stuffiness or itchiness, (-) postnasal drip, (-) nosebleeds, (-) sinus trouble. Mouth and Throat: (-) sore throat, (-) hoarseness. Neck: (-) swollen glands, (-) enlarged thyroid, (-) neck pain. Respiratory: + cough, (-) bloody sputum, + shortness of breath, - wheezing. Cardiovascular: - ankle swelling, (-) chest pain. Lymphatic: (-) lymph node enlargement. Neurologic: (-) numbness, (-) tingling. Psychiatric: (-) anxiety, (-) depression   Current Medication: Outpatient Encounter Medications as of 12/31/2023  Medication Sig   acetaminophen (TYLENOL) 500 MG tablet Take 500 mg by mouth every 6 (six) hours as needed for moderate pain or headache.   albuterol (VENTOLIN HFA) 108 (90 Base) MCG/ACT inhaler Inhale 2 puffs into the lungs every 6 (six) hours as needed for wheezing or shortness of breath.   apixaban (ELIQUIS) 2.5 MG TABS tablet TAKE 1 TABLET BY  MOUTH TWICE DAILY   ipratropium-albuterol (DUONEB) 0.5-2.5 (3) MG/3ML SOLN Take 3 mLs by nebulization every 6 (six) hours as needed.   levofloxacin (LEVAQUIN) 750 MG tablet Take 1 tablet (750 mg total) by mouth daily.   metoprolol succinate (TOPROL-XL) 50 MG 24 hr tablet TAKE 1 TABLET BY MOUTH AT BEDTIME   montelukast (SINGULAIR) 10 MG tablet TAKE 1 TABLET BY MOUTH BEDTIME FOR ASTHMA   oxyCODONE (OXY IR/ROXICODONE) 5 MG immediate release tablet Take 5 mg by mouth every 4 (four) hours as needed.   predniSONE (DELTASONE) 10 MG tablet Take one tab 3 x day for 3 days, then take one tab 2 x a day for 3 days and then take one tab a day for 3 days for copd   traMADol (ULTRAM) 50 MG tablet Take 50 mg by mouth every 6 (six) hours as needed.   triamcinolone cream (KENALOG) 0.1 % Apply 1 Application topically 2 (two) times daily.   No facility-administered encounter medications on file as of 12/31/2023.    Surgical History: Past Surgical History:  Procedure Laterality Date   BREAST EXCISIONAL BIOPSY Left 1996   neg   BREAST SURGERY     CAPSULOTOMY     CATARACT EXTRACTION W/PHACO Left 10/31/2017   Procedure: CATARACT EXTRACTION PHACO AND INTRAOCULAR LENS PLACEMENT (IOC);  Surgeon: Nevada Crane, MD;  Location: [facility] ORS;  Service: Ophthalmology;  Laterality: Left;  [place] 00:43.7 AP% 19.6 CDE 8.57 FLUID PACK LOT # [id number]   CATARACT EXTRACTION W/PHACO Right 12/05/2017   Procedure: CATARACT  EXTRACTION PHACO AND INTRAOCULAR LENS PLACEMENT (IOC);  Surgeon: Nevada Crane, MD;  Location: [facility] ORS;  Service: Ophthalmology;  Laterality: Right;  fluid pack lot #[id number]  exp 07/01/2019 [place]    00:31.4 AP%    8.8 CDE    2.79   COLONOSCOPY  02/15/2006   COLONOSCOPY WITH PROPOFOL N/A 03/21/2016   Procedure: COLONOSCOPY WITH PROPOFOL;  Surgeon: Earline Mayotte, MD;  Location: [facility] ENDOSCOPY;  Service: Endoscopy;  Laterality: N/A;   COLONOSCOPY WITH PROPOFOL N/A 03/29/2021   Procedure: COLONOSCOPY WITH  PROPOFOL;  Surgeon: Earline Mayotte, MD;  Location: [facility] ENDOSCOPY;  Service: Endoscopy;  Laterality: N/A;   DILATION AND CURETTAGE OF UTERUS     EYE SURGERY     HIP ARTHROPLASTY Right 02/10/2023   Procedure: ARTHROPLASTY BIPOLAR HIP (HEMIARTHROPLASTY);  Surgeon: Signa Kell, MD;  Location: [facility] ORS;  Service: Orthopedics;  Laterality: Right;   VARICOSE VEIN SURGERY      Medical History: Past Medical History:  Diagnosis Date   Allergy    Environmental Allergies   Arthritis    Bronchitis, chronic (HCC)    Closed right hip fracture (HCC) 02/09/2023   COPD (chronic obstructive pulmonary disease) (HCC)    Cough    CHRONIC   Dyspnea    OCCAS   Hypertension    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HCC)    Osteoporosis    Persistent atrial fibrillation (HCC)    a. Dx 01/2023 in setting of hip fx->rate controlled w/ bb. CHA2DS2VASc = 4-->eliquis 2.5 bid.   Tricuspid regurgitation    a. 01/2023 Echo: EF 55-60%, no rwma, nl RV fxn, RVSP . Mod dil RA. Mild MR. Mod-Sev TR.    Family History: Family History  Problem Relation Age of Onset   Hypertension Mother    Emphysema Father    Stroke Sister    Stroke Brother    Diabetes Maternal Aunt    Diabetes Maternal Grandmother    Lung cancer Grandchild    Breast cancer Neg Hx    Ovarian cancer Neg Hx    Colon cancer Neg Hx     Social History: Social History   Socioeconomic History   Marital status: Widowed    Spouse name: Not on file   Number of children: 2   Years of education: Not on file   Highest education level: 11th grade  Occupational History   Occupation: retired  Tobacco Use   Smoking status: Former    Current packs/day: 0.00    Average packs/day: 0.1 packs/day for 10.0 years (1.0 ttl pk-yrs)    Types: Cigarettes    Start date: 31    Quit date: 1985    Years since quitting: 40.2   Smokeless tobacco: Never   Tobacco comments:    smoked a pack a week, quit about 30 years ago (in 1985)  Vaping Use   Vaping status: Never Used   Substance and Sexual Activity   Alcohol use: Yes    Comment: ocassional- monthly or less, 1 glass of wine or beer   Drug use: No   Sexual activity: Never  Other Topics Concern   Not on file  Social History Narrative   Not on file   Social Drivers of Health   Financial Resource Strain: Low Risk  (09/11/2023)   Overall Financial Resource Strain (CARDIA)    Difficulty of Paying Living Expenses: Not hard at all  Food Insecurity: No Food Insecurity (09/11/2023)   Hunger Vital Sign    Worried About Running Out of  Food in the Last Year: Never true    Ran Out of Food in the Last Year: Never true  Transportation Needs: No Transportation Needs (09/11/2023)   PRAPARE - Administrator, Civil Service (Medical): No    Lack of Transportation (Non-Medical): No  Physical Activity: Insufficiently Active (09/11/2023)   Exercise Vital Sign    Days of Exercise per Week: 4 days    Minutes of Exercise per Session: 20 min  Stress: No Stress Concern Present (09/11/2023)   [employer] of Occupational Health - Occupational Stress Questionnaire    Feeling of Stress : Not at all  Social Connections: Moderately Integrated (09/11/2023)   Social Connection and Isolation Panel [NHANES]    Frequency of Communication with Friends and Family: More than three times a week    Frequency of Social Gatherings with Friends and Family: More than three times a week    Attends Religious Services: More than 4 times per year    Active Member of [employer] or Organizations: Yes    Attends Banker Meetings: More than 4 times per year    Marital Status: Widowed  Intimate Partner Violence: Not At Risk (09/11/2023)   Humiliation, Afraid, Rape, and Kick questionnaire    Fear of Current or Ex-Partner: No    Emotionally Abused: No    Physically Abused: No    Sexually Abused: No    Vital Signs: Blood pressure 119/75, pulse 94, temperature 98.4 F (36.9 C), resp. rate 16, height 5\' 2"  (1.575 m),  weight 120 lb 9.6 oz (54.7 kg), SpO2 98%.  Examination: General Appearance: The patient is well-developed, well-nourished, and in no distress. Skin: Gross inspection of skin unremarkable. Head: normocephalic, no gross deformities. Eyes: no gross deformities noted. ENT: ears appear grossly normal no exudates. Neck: Supple. No thyromegaly. No LAD. Respiratory: few rhonchi noted. Cardiovascular: Normal S1 and S2 without murmur or rub. Extremities: No cyanosis. pulses are equal. Neurologic: Alert and oriented. No involuntary movements.  LABS: Recent Results (from the past 2160 hours)  Comprehensive metabolic panel     Status: Abnormal   Collection Time: 10/31/23 10:04 AM  Result Value Ref Range   Glucose 91 70 - 99 mg/dL   BUN 11 8 - 27 mg/dL   Creatinine, Ser 1.61 0.57 - 1.00 mg/dL   eGFR 79 >09 UE/AVW/0.98   BUN/Creatinine Ratio 14 12 - 28   Sodium 134 134 - 144 mmol/L   Potassium 4.8 3.5 - 5.2 mmol/L   Chloride 96 96 - 106 mmol/L   CO2 24 20 - 29 mmol/L   Calcium 9.7 8.7 - 10.3 mg/dL   Total Protein 6.8 6.0 - 8.5 g/dL   Albumin 4.5 3.7 - 4.7 g/dL   Globulin, Total 2.3 1.5 - 4.5 g/dL   Bilirubin Total 1.8 (H) 0.0 - 1.2 mg/dL   Alkaline Phosphatase 36 (L) 44 - 121 IU/L   AST 15 0 - 40 IU/L   ALT 7 0 - 32 IU/L  Lipid Panel With LDL/HDL Ratio     Status: None   Collection Time: 10/31/23 10:04 AM  Result Value Ref Range   Cholesterol, Total 145 100 - 199 mg/dL   Triglycerides 92 0 - 149 mg/dL   HDL 49 >11 mg/dL   VLDL Cholesterol Cal 17 5 - 40 mg/dL   LDL Chol Calc (NIH) 79 0 - 99 mg/dL   LDL/HDL Ratio 1.6 0.0 - 3.2 ratio    Comment:  LDL/HDL Ratio                                             Men  Women                               1/2 Avg.Risk  1.0    1.5                                   Avg.Risk  3.6    3.2                                2X Avg.Risk  6.2    5.0                                3X Avg.Risk  8.0    6.1   Hemoglobin A1c      Status: Abnormal   Collection Time: 10/31/23 10:04 AM  Result Value Ref Range   Hgb A1c MFr Bld 5.7 (H) 4.8 - 5.6 %    Comment:          Prediabetes: 5.7 - 6.4          Diabetes: >6.4          Glycemic control for adults with diabetes: <7.0    Est. average glucose Bld gHb Est-mCnc 117 mg/dL    Radiology: DG Chest 2 View Result Date: 12/23/2023 CLINICAL DATA:  Coughing, wheezing and shortness of breath for 4 days. History of COPD. EXAM: CHEST - 2 VIEW COMPARISON:  Radiographs 02/09/2023 and 10/23/2021.  CT 07/27/2008. FINDINGS: The heart size and mediastinal contours are stable with moderate cardiac enlargement and aortic atherosclerosis. The lungs are hyperinflated but clear. No pleural effusion or pneumothorax. No acute osseous findings are evident. Pectus deformity of the sternum and mild thoracic spondylosis noted. IMPRESSION: No evidence of acute cardiopulmonary process. Stable cardiomegaly and pulmonary hyperinflation. Electronically Signed   By: Carey Bullocks M.D.   On: 12/23/2023 12:34    No results found.  DG Chest 2 View Result Date: 12/23/2023 CLINICAL DATA:  Coughing, wheezing and shortness of breath for 4 days. History of COPD. EXAM: CHEST - 2 VIEW COMPARISON:  Radiographs 02/09/2023 and 10/23/2021.  CT 07/27/2008. FINDINGS: The heart size and mediastinal contours are stable with moderate cardiac enlargement and aortic atherosclerosis. The lungs are hyperinflated but clear. No pleural effusion or pneumothorax. No acute osseous findings are evident. Pectus deformity of the sternum and mild thoracic spondylosis noted. IMPRESSION: No evidence of acute cardiopulmonary process. Stable cardiomegaly and pulmonary hyperinflation. Electronically Signed   By: Carey Bullocks M.D.   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 Chronic obstructive pulmonary disease with acute lower respiratory infection (HCC) (Primary)  I reviewed her medications on a she has some gaps will try to fill those capsule it placing her on respiratory has maintenance medications and she can continue using the albuterol as rescue - budeson-glycopyrrolate-formoterol (BREZTRI AEROSPHERE) 160-9-4.8 MCG/ACT AERO; Inhale 2 puffs into the lungs 2 (two) times daily.  Dispense: 10.7 g; Refill: 11  2. Atrial fibrillation, unspecified type (HCC)  Rate is controlled will continue with supportive care and medical management  3. IPF (idiopathic pulmonary fibrosis) (HCC)  She has advanced disease prognosis guarded she does not want to be on anti fibrotic agents with discuss this numerous times in the past   General Counseling: I have discussed the findings of the evaluation and examination with Stormie.  I have also discussed any further diagnostic evaluation thatmay be needed or ordered today. Neiva verbalizes understanding of the findings of todays visit. We also reviewed her medications today and discussed drug interactions and side effects including but not limited excessive drowsiness and altered mental states. We also discussed that there is always a risk not just to her but also people around her. she has been encouraged to call the office with any  questions or concerns that should arise related to todays visit.  No orders of the defined types were placed in this encounter.    Time spent: 7  I have personally obtained a history, examined the patient, evaluated laboratory and imaging results, formulated the assessment and plan and placed orders.    Yevonne Pax, MD [facility] Pulmonary and Critical Care Sleep medicine

## 2024-01-02 ENCOUNTER — Telehealth: Payer: Self-pay

## 2024-01-02 NOTE — Telephone Encounter (Signed)
 PA was approved for [facility] and patient was notified.

## 2024-01-16 ENCOUNTER — Encounter

## 2024-01-21 ENCOUNTER — Encounter

## 2024-02-03 DIAGNOSIS — M7551 Bursitis of right shoulder: Secondary | ICD-10-CM | POA: Diagnosis not present

## 2024-02-03 DIAGNOSIS — G8929 Other chronic pain: Secondary | ICD-10-CM | POA: Diagnosis not present

## 2024-02-03 DIAGNOSIS — M25562 Pain in left knee: Secondary | ICD-10-CM | POA: Diagnosis not present

## 2024-02-04 ENCOUNTER — Ambulatory Visit
Admission: RE | Admit: 2024-02-04 | Discharge: 2024-02-04 | Disposition: A | Discharge status: Home / Self Care | Source: Ambulatory Visit | Attending: Family Medicine | Admitting: Family Medicine

## 2024-02-04 DIAGNOSIS — Z1231 Encounter for screening mammogram for malignant neoplasm of breast: Secondary | ICD-10-CM | POA: Insufficient documentation

## 2024-02-06 ENCOUNTER — Encounter: Payer: Self-pay | Admitting: Family Medicine

## 2024-02-10 ENCOUNTER — Encounter: Payer: Self-pay | Admitting: Internal Medicine

## 2024-02-10 ENCOUNTER — Ambulatory Visit: Admitting: Internal Medicine

## 2024-02-10 VITALS — BP 130/80 | HR 64 | Temp 97.8°F | Resp 16 | Ht 63.0 in | Wt 126.0 lb

## 2024-02-10 DIAGNOSIS — I4891 Unspecified atrial fibrillation: Secondary | ICD-10-CM | POA: Diagnosis not present

## 2024-02-10 DIAGNOSIS — J440 Chronic obstructive pulmonary disease with acute lower respiratory infection: Secondary | ICD-10-CM | POA: Diagnosis not present

## 2024-02-10 DIAGNOSIS — J84112 Idiopathic pulmonary fibrosis: Secondary | ICD-10-CM

## 2024-02-10 NOTE — Progress Notes (Signed)
 [facility] [address] [place], [place] [address]  Pulmonary Sleep Medicine   Office Visit Note  Patient Name: Karen Henson DOB: Jul 19, 1941 MRN [id number]  Date of Service: 02/10/2024  Complaints/HPI: She feels like the breztri  is helping. She feels less shortness of breath.   She has less cough noted pressures little bit more active.  Family was in the room they were updated also with her overall prognosis.  She was given some samples for the   Breztri  be continued.  Office Spirometry Results:     ROS  General: (-) fever, (-) chills, (-) night sweats, (-) weakness Skin: (-) rashes, (-) itching,. Eyes: (-) visual changes, (-) redness, (-) itching. Nose and Sinuses: (-) nasal stuffiness or itchiness, (-) postnasal drip, (-) nosebleeds, (-) sinus trouble. Mouth and Throat: (-) sore throat, (-) hoarseness. Neck: (-) swollen glands, (-) enlarged thyroid , (-) neck pain. Respiratory: +- cough, (-) bloody sputum, +- shortness of breath, - wheezing. Cardiovascular: + ankle swelling, (-) chest pain. Lymphatic: (-) lymph node enlargement. Neurologic: (-) numbness, (-) tingling. Psychiatric: (-) anxiety, (-) depression   Current Medication: Outpatient Encounter Medications as of 02/10/2024  Medication Sig   acetaminophen  (TYLENOL ) 500 MG tablet Take 500 mg by mouth every 6 (six) hours as needed for moderate pain or headache.   albuterol  (VENTOLIN  HFA) 108 (90 Base) MCG/ACT inhaler Inhale 2 puffs into the lungs every 6 (six) hours as needed for wheezing or shortness of breath.   apixaban  (ELIQUIS ) 2.5 MG TABS tablet TAKE 1 TABLET BY MOUTH TWICE DAILY   budeson-glycopyrrolate-formoterol  (BREZTRI  AEROSPHERE) 160-9-4.8 MCG/ACT AERO Inhale 2 puffs into the lungs 2 (two) times daily.   ipratropium-albuterol  (DUONEB) 0.5-2.5 (3) MG/3ML SOLN Take 3 mLs by nebulization every 6 (six) hours as needed.   metoprolol  succinate (TOPROL -XL) 50 MG 24 hr tablet TAKE 1 TABLET BY MOUTH AT  BEDTIME   montelukast  (SINGULAIR ) 10 MG tablet TAKE 1 TABLET BY MOUTH BEDTIME FOR ASTHMA   oxyCODONE  (OXY IR/ROXICODONE ) 5 MG immediate release tablet Take 5 mg by mouth every 4 (four) hours as needed.   predniSONE  (DELTASONE ) 10 MG tablet Take one tab 3 x day for 3 days, then take one tab 2 x a day for 3 days and then take one tab a day for 3 days for copd   traMADol  (ULTRAM ) 50 MG tablet Take 50 mg by mouth every 6 (six) hours as needed.   triamcinolone  cream (KENALOG ) 0.1 % Apply 1 Application topically 2 (two) times daily.   [DISCONTINUED] levofloxacin  (LEVAQUIN ) 750 MG tablet Take 1 tablet (750 mg total) by mouth daily.   No facility-administered encounter medications on file as of 02/10/2024.    Surgical History: Past Surgical History:  Procedure Laterality Date   BREAST EXCISIONAL BIOPSY Left 1996   neg   BREAST SURGERY     CAPSULOTOMY     CATARACT EXTRACTION W/PHACO Left 10/31/2017   Procedure: CATARACT EXTRACTION PHACO AND INTRAOCULAR LENS PLACEMENT (IOC);  Surgeon: Rosa College, MD;  Location: [facility] ORS;  Service: Ophthalmology;  Laterality: Left;  US  00:43.7 AP% 19.6 CDE 8.57 FLUID PACK LOT # [id number]   CATARACT EXTRACTION W/PHACO Right 12/05/2017   Procedure: CATARACT EXTRACTION PHACO AND INTRAOCULAR LENS PLACEMENT (IOC);  Surgeon: Rosa College, MD;  Location: [facility] ORS;  Service: Ophthalmology;  Laterality: Right;  fluid pack lot #[id number]  exp 07/01/2019 US     00:31.4 AP%    8.8 CDE    2.79   COLONOSCOPY  02/15/2006  COLONOSCOPY WITH PROPOFOL  N/A 03/21/2016   Procedure: COLONOSCOPY WITH PROPOFOL ;  Surgeon: Marshall Skeeter, MD;  Location: [facility];  Service: Endoscopy;  Laterality: N/A;   COLONOSCOPY WITH PROPOFOL  N/A 03/29/2021   Procedure: COLONOSCOPY WITH PROPOFOL ;  Surgeon: Marshall Skeeter, MD;  Location: [facility] ENDOSCOPY;  Service: Endoscopy;  Laterality: N/A;   DILATION AND CURETTAGE OF UTERUS     EYE SURGERY     HIP ARTHROPLASTY Right 02/10/2023    Procedure: ARTHROPLASTY BIPOLAR HIP (HEMIARTHROPLASTY);  Surgeon: Lorri Rota, MD;  Location: [facility] ORS;  Service: Orthopedics;  Laterality: Right;   VARICOSE VEIN SURGERY      Medical History: Past Medical History:  Diagnosis Date   Allergy    Environmental Allergies   Arthritis    Bronchitis, chronic (HCC)    Closed right hip fracture (HCC) 02/09/2023   COPD (chronic obstructive pulmonary disease) (HCC)    Cough    CHRONIC   Dyspnea    OCCAS   Hypertension    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HCC)    Osteoporosis    Persistent atrial fibrillation (HCC)    a. Dx 01/2023 in setting of hip fx->rate controlled w/ bb. CHA2DS2VASc = 4-->eliquis  2.5 bid.   Tricuspid regurgitation    a. 01/2023 Echo: EF 55-60%, no rwma, nl RV fxn, RVSP . Mod dil RA. Mild MR. Mod-Sev TR.    Family History: Family History  Problem Relation Age of Onset   Hypertension Mother    Emphysema Father    Stroke Sister    Stroke Brother    Diabetes Maternal Aunt    Diabetes Maternal Grandmother    Lung cancer Grandchild    Breast cancer Neg Hx    Ovarian cancer Neg Hx    Colon cancer Neg Hx     Social History: Social History   Socioeconomic History   Marital status: Widowed    Spouse name: Not on file   Number of children: 2   Years of education: Not on file   Highest education level: 11th grade  Occupational History   Occupation: retired  Tobacco Use   Smoking status: Former    Current packs/day: 0.00    Average packs/day: 0.1 packs/day for 10.0 years (1.0 ttl pk-yrs)    Types: Cigarettes    Start date: 83    Quit date: 1985    Years since quitting: 40.3   Smokeless tobacco: Never   Tobacco comments:    smoked a pack a week, quit about 30 years ago (in 1985)  Vaping Use   Vaping status: Never Used  Substance and Sexual Activity   Alcohol use: Yes    Comment: ocassional- monthly or less, 1 glass of wine or beer   Drug use: No   Sexual activity: Never  Other Topics Concern   Not on file   Social History Narrative   Not on file   Social Drivers of Health   Financial Resource Strain: Low Risk  (09/11/2023)   Overall Financial Resource Strain (CARDIA)    Difficulty of Paying Living Expenses: Not hard at all  Food Insecurity: No Food Insecurity (09/11/2023)   Hunger Vital Sign    Worried About Running Out of Food in the Last Year: Never true    Ran Out of Food in the Last Year: Never true  Transportation Needs: No Transportation Needs (09/11/2023)   PRAPARE - Administrator, Civil Service (Medical): No    Lack of Transportation (Non-Medical): No  Physical Activity: Insufficiently Active (  09/11/2023)   Exercise Vital Sign    Days of Exercise per Week: 4 days    Minutes of Exercise per Session: 20 min  Stress: No Stress Concern Present (09/11/2023)   [employer] of Occupational Health - Occupational Stress Questionnaire    Feeling of Stress : Not at all  Social Connections: Moderately Integrated (09/11/2023)   Social Connection and Isolation Panel [NHANES]    Frequency of Communication with Friends and Family: More than three times a week    Frequency of Social Gatherings with Friends and Family: More than three times a week    Attends Religious Services: More than 4 times per year    Active Member of [employer] or Organizations: Yes    Attends Banker Meetings: More than 4 times per year    Marital Status: Widowed  Intimate Partner Violence: Not At Risk (09/11/2023)   Humiliation, Afraid, Rape, and Kick questionnaire    Fear of Current or Ex-Partner: No    Emotionally Abused: No    Physically Abused: No    Sexually Abused: No    Vital Signs: Blood pressure 130/80, pulse 64, temperature 97.8 F (36.6 C), resp. rate 16, height 5\' 3"  (1.6 m), weight 126 lb (57.2 kg), SpO2 95%.  Examination: General Appearance: The patient is well-developed, well-nourished, and in no distress. Skin: Gross inspection of skin unremarkable. Head:  normocephalic, no gross deformities. Eyes: no gross deformities noted. ENT: ears appear grossly normal no exudates. Neck: Supple. No thyromegaly. No LAD. Respiratory: few crackles baseline. Cardiovascular: Normal S1 and S2 without murmur or rub. Extremities: No cyanosis. pulses are equal. Neurologic: Alert and oriented. No involuntary movements.  LABS: No results found for this or any previous visit (from the past 2160 hours).  Radiology: MM 3D SCREENING MAMMOGRAM BILATERAL BREAST Result Date: 02/05/2024 CLINICAL DATA:  Screening. EXAM: DIGITAL SCREENING BILATERAL MAMMOGRAM WITH TOMOSYNTHESIS AND CAD TECHNIQUE: Bilateral screening digital craniocaudal and mediolateral oblique mammograms were obtained. Bilateral screening digital breast tomosynthesis was performed. The images were evaluated with computer-aided detection. COMPARISON:  Previous exam(s). ACR Breast Density Category b: There are scattered areas of fibroglandular density. FINDINGS: There are no findings suspicious for malignancy. IMPRESSION: No mammographic evidence of malignancy. A result letter of this screening mammogram will be mailed directly to the patient. RECOMMENDATION: Screening mammogram in one year. (Code:SM-B-01Y) BI-RADS CATEGORY  1: Negative. Electronically Signed   By: Sande Cromer M.D.   On: 02/05/2024 16:16    No results found.  MM 3D SCREENING MAMMOGRAM BILATERAL BREAST Result Date: 02/05/2024 CLINICAL DATA:  Screening. EXAM: DIGITAL SCREENING BILATERAL MAMMOGRAM WITH TOMOSYNTHESIS AND CAD TECHNIQUE: Bilateral screening digital craniocaudal and mediolateral oblique mammograms were obtained. Bilateral screening digital breast tomosynthesis was performed. The images were evaluated with computer-aided detection. COMPARISON:  Previous exam(s). ACR Breast Density Category b: There are scattered areas of fibroglandular density. FINDINGS: There are no findings suspicious for malignancy. IMPRESSION: No mammographic  evidence of malignancy. A result letter of this screening mammogram will be mailed directly to the patient. RECOMMENDATION: Screening mammogram in one year. (Code:SM-B-01Y) BI-RADS CATEGORY  1: Negative. Electronically Signed   By: Sande Cromer M.D.   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 Chronic obstructive pulmonary disease with acute lower respiratory infection (HCC) (Primary)  She has advance the on COPD has been treated for a acute infection appears to be doing better will continue with supportive care continue with inhaler regimen.  2. Atrial fibrillation, unspecified type (HCC)   Patient's rate is controlled will continue to monitor closely.  3. IPF (idiopathic pulmonary fibrosis) (HCC)   We are monitoring she does not on any treatment on this   General Counseling: I have discussed the findings of the evaluation and examination with Navayah.  I have also discussed any further diagnostic evaluation thatmay be needed or ordered today. Teala verbalizes understanding of the findings of todays visit. We also reviewed her medications today and discussed drug interactions and side effects including but not limited excessive  drowsiness and altered mental states. We also discussed that there is always a risk not just to her but also people around her. she has been encouraged to call the office with any questions or concerns that should arise related to todays visit.  No orders of the defined types were placed in this encounter.    Time spent: 50  I have personally obtained a history, examined the patient, evaluated laboratory and imaging results, formulated the assessment and plan and placed orders.    Cordie Deters, MD [facility] Pulmonary and Critical Care Sleep medicine

## 2024-02-10 NOTE — Patient Instructions (Signed)
Pulmonary Fibrosis  Pulmonary fibrosis is a type of lung disease that causes scarring. Over time, the scar tissue builds up in the air sacs of your lungs (alveoli). This makes it hard for you to breathe because less oxygen gets into your bloodstream. Scarring from pulmonary fibrosis is permanent and may lead to other serious health problems. What are the causes? There are many different causes of pulmonary fibrosis. In some cases, the cause is not known. This is called idiopathic pulmonary fibrosis. Other causes include: Exposure to chemicals and substances found in agricultural, farm, Holiday representative, or factory work. These include mold, asbestos, silica, metal dusts, and toxic fumes. Sarcoidosis. In this disease, areas of inflammatory cells (granulomas) form and most often affect the lungs. Autoimmune diseases. These include diseases such as rheumatoid arthritis, systemic sclerosis, or connective tissue disease. Taking certain medicines. These include drugs used in radiation therapy or used to treat seizures, heart problems, and some infections. What increases the risk? You are more likely to develop this condition if: You have a family history of the disease. You are an older person. The condition is more common in older adults. You have a history of smoking. You have a job that exposes you to certain chemicals. You have gastroesophageal reflux disease (GERD). What are the signs or symptoms? Symptoms of this condition include: Difficulty breathing that gets worse with activity. Shortness of breath (dyspnea). Dry, hacking cough. Rapid, shallow breathing during exercise or while at rest. Other symptoms may include: Loss of appetite or weight loss Tiredness (fatigue) or weakness. Bluish skin and lips. Rounded and enlarged fingertips (clubbing). How is this diagnosed? This condition may be diagnosed based on: Your symptoms and medical history. A physical exam. You may also have tests,  including: A test that involves looking inside your lungs with an instrument (bronchoscopy). Imaging studies of your lungs and heart. Tests to measure how well you are breathing (pulmonary function tests). Blood tests. Tests to see how well your lungs work while you are walking (pulmonary stress test). A procedure to remove a lung tissue sample to look at it under a microscope (biopsy). How is this treated? There is no cure for pulmonary fibrosis. Treatment focuses on managing symptoms and preventing scarring from getting worse. This may include: Medicines, such as: Steroids to prevent permanent lung changes. Medicines to suppress your body's defense system (immune system). Medicines to help with lung function by reducing inflammation or scarring. Ongoing monitoring with X-rays and lab work. Oxygen therapy. Pulmonary rehabilitation. Surgery. In some cases, a lung transplant is possible. Follow these instructions at home:  Medicines Take over-the-counter and prescription medicines only as told by your health care provider. Keep your vaccinations up to date as recommended by your health care provider. Activity Get regular exercise, but do not pick activities that are too strenuous for you. Ask your health care provider what activities are safe for you. If you have physical limitations, you may get exercise by walking, using a stationary bike, or doing chair exercises. Ask your health care provider about using oxygen while exercising. Do breathing exercises as told by your health care provider. Plan rest periods when you get tired. General instructions Do not use any products that contain nicotine or tobacco. These products include cigarettes, chewing tobacco, and vaping devices, such as e-cigarettes. If you need help quitting, ask your health care provider. If you are exposed to chemicals and substances at work, make sure that you wear a mask or respirator at all times. Learn to  manage  stress. If you need help to do this, ask your health care provider. Join a pulmonary rehabilitation program or a support group for people with pulmonary fibrosis. Eat small meals often so you do not get too full. Overeating can make breathing trouble worse. Maintain a healthy weight. Lose weight if you need to. Keep all follow-up visits. This is important. Where to find more information American Lung Association: www.lung.org National Heart, Lung, and Blood Institute: [employer].is Pulmonary Fibrosis Foundation: pulmonaryfibrosis.org Contact a health care provider if: You have symptoms that do not get better with medicines. You are not able to be as active as usual. You have trouble taking a deep breath. You have a fever or chills. You have blue lips or skin. You have a lot of headaches. You cough up mucus that is dark in color. You have feelings of depression or sadness. You are unable to sleep because it is hard to breathe. Get help right away if: Your symptoms suddenly worsen. You have chest pain. You cough up blood. You get very confused or sleepy. These symptoms may be an emergency. Get help right away. Call 911. Do not wait to see if the symptoms will go away. Do not drive yourself to the hospital. Summary Pulmonary fibrosis is a type of lung disease that causes scar tissue to build up in the air sacs of your lungs (alveoli) over time. This makes it hard for you to breathe because less oxygen gets into your bloodstream. Scarring from pulmonary fibrosis is permanent and may lead to other serious health problems. You are more likely to develop this condition if you have a family history of the condition or a job that exposes you to certain chemicals. There is no cure for pulmonary fibrosis. Treatment focuses on managing symptoms and preventing scarring from getting worse. This information is not intended to replace advice given to you by your health care provider. Make sure  you discuss any questions you have with your health care provider. Document Revised: 05/09/2021 Document Reviewed: 05/09/2021 Elsevier Patient Education  2024 [employer].

## 2024-02-17 NOTE — Progress Notes (Signed)
 Cardiology Office Note  Date:  02/18/2024   ID:  Karen Henson, Karen Henson 02-08-1941, MRN [id number]  PCP:  Mazie Speed, MD   Chief Complaint  Patient presents with   6 month follow up     Patient c/o shortness of breath with exertion and some bilateral LE edema.    HPI:  83 y.o. y/o female with a history of  hypertension,  COPD, idiopathic pulmonary fibrosis,  who presents for follow-up after recent admission 5/24  for right femoral neck fracture and right anterior inferior glenoid fracture complicated by new onset atrial fibrillation.  Who presents for follow-up in clinic for permanent atrial fibrillation  Last seen by myself in clinic July 2024  Reports having a Fall 1 /2025, legs gave way Was home when she went down, trauma to her left knee Lives alone, family and friends live nearby Still with ecchymotic bruising on her left knee  Reports that she is scheduled to start PT  Denies significant symptoms from her atrial fibrillation Previously elected for rate control and anticoagulation  Denies significant leg swelling, no PND orthopnea denies tachycardia Tolerating metoprolol  and low-dose Eliquis   EKG personally reviewed by myself on todays visit EKG Interpretation Date/Time:  Tuesday Feb 18 2024 10:44:00 EDT Ventricular Rate:  62 PR Interval:    QRS Duration:  102 QT Interval:  446 QTC Calculation: 452 R Axis:   -36  Text Interpretation: Atrial fibrillation Left axis deviation Moderate voltage criteria for LVH, may be normal variant ( R in aVL , Cornell product ) Septal infarct , age undetermined ST & T wave abnormality, consider lateral ischemia When compared with ECG of 15-Apr-2023 09:58, No significant change was found Confirmed by Belva Boyden [phone]) on 02/18/2024 11:18:54 AM   Other past medical history reviewed Prior history of fall with acute fractures of the right anterior inferior glenoid and right femoral neck status post right hemiarthroplasty. She  was noted to be in atrial fibrillation on arrival.   Echo during hospitalization showed normal LV function with RVSP of 35 mmHg and moderate to severe TR.  anticoagulated with Eliquis .  2.5 twice daily reduced dose given age and weight  PMH:   has a past medical history of Allergy, Arthritis, Bronchitis, chronic (HCC), Closed right hip fracture (HCC) (02/09/2023), COPD (chronic obstructive pulmonary disease) (HCC), Cough, Dyspnea, Hypertension, 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HCC), Osteoporosis, Persistent atrial fibrillation (HCC), and Tricuspid regurgitation.  PSH:    Past Surgical History:  Procedure Laterality Date   BREAST EXCISIONAL BIOPSY Left 1996   neg   BREAST SURGERY     CAPSULOTOMY     CATARACT EXTRACTION W/PHACO Left 10/31/2017   Procedure: CATARACT EXTRACTION PHACO AND INTRAOCULAR LENS PLACEMENT (IOC);  Surgeon: Rosa College, MD;  Location: [facility];  Service: Ophthalmology;  Laterality: Left;  US  00:43.7 AP% 19.6 CDE 8.57 FLUID PACK LOT # [id number]   CATARACT EXTRACTION W/PHACO Right 12/05/2017   Procedure: CATARACT EXTRACTION PHACO AND INTRAOCULAR LENS PLACEMENT (IOC);  Surgeon: Rosa College, MD;  Location: [facility];  Service: Ophthalmology;  Laterality: Right;  fluid pack lot #[id number] H  exp 07/01/2019 US     00:31.4 AP%    8.8 CDE    2.79   COLONOSCOPY  02/15/2006   COLONOSCOPY WITH PROPOFOL  N/A 03/21/2016   Procedure: COLONOSCOPY WITH PROPOFOL ;  Surgeon: Marshall Skeeter, MD;  Location: [facility];  Service: Endoscopy;  Laterality: N/A;   COLONOSCOPY WITH PROPOFOL  N/A 03/29/2021   Procedure: COLONOSCOPY WITH PROPOFOL ;  Surgeon: Glenetta Lane  W, MD;  Location: [facility];  Service: Endoscopy;  Laterality: N/A;   DILATION AND CURETTAGE OF UTERUS     EYE SURGERY     HIP ARTHROPLASTY Right 02/10/2023   Procedure: ARTHROPLASTY BIPOLAR HIP (HEMIARTHROPLASTY);  Surgeon: Lorri Rota, MD;  Location: [facility];  Service: Orthopedics;  Laterality: Right;   VARICOSE VEIN  SURGERY      Current Outpatient Medications  Medication Sig Dispense Refill   acetaminophen  (TYLENOL ) 500 MG tablet Take 500 mg by mouth every 6 (six) hours as needed for moderate pain or headache.     albuterol  (VENTOLIN  HFA) 108 (90 Base) MCG/ACT inhaler Inhale 2 puffs into the lungs every 6 (six) hours as needed for wheezing or shortness of breath. 18 g 3   apixaban  (ELIQUIS ) 2.5 MG TABS tablet TAKE 1 TABLET BY MOUTH TWICE DAILY 180 tablet 1   budeson-glycopyrrolate-formoterol  (BREZTRI  AEROSPHERE) 160-9-4.8 MCG/ACT AERO Inhale 2 puffs into the lungs 2 (two) times daily. 10.7 g 11   ipratropium-albuterol  (DUONEB) 0.5-2.5 (3) MG/3ML SOLN Take 3 mLs by nebulization every 6 (six) hours as needed. 360 mL 1   metoprolol  succinate (TOPROL -XL) 50 MG 24 hr tablet TAKE 1 TABLET BY MOUTH AT BEDTIME 90 tablet 1   montelukast  (SINGULAIR ) 10 MG tablet TAKE 1 TABLET BY MOUTH BEDTIME FOR ASTHMA 90 tablet 1   oxyCODONE  (OXY IR/ROXICODONE ) 5 MG immediate release tablet Take 5 mg by mouth every 4 (four) hours as needed. (Patient not taking: Reported on 02/18/2024)     traMADol  (ULTRAM ) 50 MG tablet Take 50 mg by mouth every 6 (six) hours as needed. (Patient not taking: Reported on 02/18/2024)     No current facility-administered medications for this visit.    Allergies:   Lisinopril    Social History:  The patient  reports that she quit smoking about 40 years ago. Her smoking use included cigarettes. She started smoking about 50 years ago. She has a 1 pack-year smoking history. She has never used smokeless tobacco. She reports current alcohol use. She reports that she does not use drugs.   Family History:   family history includes Diabetes in her maternal aunt and maternal grandmother; Emphysema in her father; Hypertension in her mother; Lung cancer in her grandchild; Stroke in her brother and sister.    Review of Systems: Review of Systems  Constitutional: Negative.   HENT: Negative.    Respiratory:  Negative.    Cardiovascular: Negative.   Gastrointestinal: Negative.   Musculoskeletal:  Positive for falls.  Neurological: Negative.   Psychiatric/Behavioral: Negative.    All other systems reviewed and are negative.  PHYSICAL EXAM: VS:  BP 120/72 (BP Location: Left Arm, Patient Position: Sitting, Cuff Size: Normal)   Pulse 62   Ht 5\' 2"  (1.575 m)   Wt 124 lb 8 oz (56.5 kg)   SpO2 95%   BMI 22.77 kg/m  , BMI Body mass index is 22.77 kg/m. GEN: Well nourished, well developed, in no acute distress HEENT: normal Neck: no JVD, carotid bruits, or masses Cardiac: RRR; no murmurs, rubs, or gallops,no edema  Respiratory:  clear to auscultation bilaterally, normal work of breathing GI: soft, nontender, nondistended, + BS MS: no deformity or atrophy Skin: warm and dry, no rash Neuro:  Strength and sensation are intact Psych: euthymic mood, full affect  Recent Labs: 03/20/2023: Hemoglobin 12.9; Platelets 310 10/31/2023: ALT 7; BUN 11; Creatinine, Ser 0.76; Potassium 4.8; Sodium 134    Lipid Panel Lab Results  Component Value Date  CHOL 145 10/31/2023   HDL 49 10/31/2023   LDLCALC 79 10/31/2023   TRIG 92 10/31/2023      Wt Readings from Last 3 Encounters:  02/18/24 124 lb 8 oz (56.5 kg)  02/10/24 126 lb (57.2 kg)  12/31/23 120 lb 9.6 oz (54.7 kg)     ASSESSMENT AND PLAN:  Problem List Items Addressed This Visit       Cardiology Problems   Essential hypertension   Relevant Orders   EKG 12-Lead (Completed)   Moderate mixed hyperlipidemia not requiring statin therapy   Other Visit Diagnoses       Persistent atrial fibrillation (HCC)    -  Primary   Relevant Orders   EKG 12-Lead (Completed)       Persistent atrial fibrillation Rate controlled on metoprolol , tolerating Eliquis  2.5 twice daily -Previously preferred rate control over rhythm control, relatively asymptomatic  Shortness of breath In the setting of atrial fibrillation,COPD/pulmonary fibrosis,  deconditioning On room air, Recommend regular walking program for conditioning and leg strengthening  Falls Reports that she is scheduled to work with PT Stressed importance of leg strengthening exercises, regular walking program if she would like to remain independent in her own house  Signed, Juanda Noon, M.D., Ph.D. [facility] [place], [place] [phone]

## 2024-02-18 ENCOUNTER — Ambulatory Visit: Attending: Cardiovascular Disease | Admitting: Cardiovascular Disease

## 2024-02-18 VITALS — BP 120/72 | HR 62 | Ht 62.0 in | Wt 124.5 lb

## 2024-02-18 DIAGNOSIS — E782 Mixed hyperlipidemia: Secondary | ICD-10-CM | POA: Diagnosis not present

## 2024-02-18 DIAGNOSIS — I4819 Other persistent atrial fibrillation: Secondary | ICD-10-CM | POA: Insufficient documentation

## 2024-02-18 DIAGNOSIS — I10 Essential (primary) hypertension: Secondary | ICD-10-CM | POA: Insufficient documentation

## 2024-02-18 NOTE — Patient Instructions (Signed)

## 2024-02-19 DIAGNOSIS — D040 Carcinoma in situ of skin of lip: Secondary | ICD-10-CM | POA: Diagnosis not present

## 2024-02-28 DIAGNOSIS — M25562 Pain in left knee: Secondary | ICD-10-CM | POA: Diagnosis not present

## 2024-02-28 DIAGNOSIS — G8929 Other chronic pain: Secondary | ICD-10-CM | POA: Diagnosis not present

## 2024-03-04 DIAGNOSIS — G8929 Other chronic pain: Secondary | ICD-10-CM | POA: Diagnosis not present

## 2024-03-04 DIAGNOSIS — M25562 Pain in left knee: Secondary | ICD-10-CM | POA: Diagnosis not present

## 2024-03-11 DIAGNOSIS — M25562 Pain in left knee: Secondary | ICD-10-CM | POA: Diagnosis not present

## 2024-03-11 DIAGNOSIS — G8929 Other chronic pain: Secondary | ICD-10-CM | POA: Diagnosis not present

## 2024-03-17 ENCOUNTER — Other Ambulatory Visit: Payer: Self-pay | Admitting: Nurse Practitioner

## 2024-03-18 DIAGNOSIS — M25562 Pain in left knee: Secondary | ICD-10-CM | POA: Diagnosis not present

## 2024-03-18 DIAGNOSIS — G8929 Other chronic pain: Secondary | ICD-10-CM | POA: Diagnosis not present

## 2024-03-26 DIAGNOSIS — G8929 Other chronic pain: Secondary | ICD-10-CM | POA: Diagnosis not present

## 2024-03-26 DIAGNOSIS — M25562 Pain in left knee: Secondary | ICD-10-CM | POA: Diagnosis not present

## 2024-04-02 DIAGNOSIS — M25562 Pain in left knee: Secondary | ICD-10-CM | POA: Diagnosis not present

## 2024-04-02 DIAGNOSIS — G8929 Other chronic pain: Secondary | ICD-10-CM | POA: Diagnosis not present

## 2024-04-08 DIAGNOSIS — M25562 Pain in left knee: Secondary | ICD-10-CM | POA: Diagnosis not present

## 2024-04-08 DIAGNOSIS — G8929 Other chronic pain: Secondary | ICD-10-CM | POA: Diagnosis not present

## 2024-04-22 DIAGNOSIS — G8929 Other chronic pain: Secondary | ICD-10-CM | POA: Diagnosis not present

## 2024-04-22 DIAGNOSIS — M25562 Pain in left knee: Secondary | ICD-10-CM | POA: Diagnosis not present

## 2024-04-29 DIAGNOSIS — G8929 Other chronic pain: Secondary | ICD-10-CM | POA: Diagnosis not present

## 2024-04-29 DIAGNOSIS — M25562 Pain in left knee: Secondary | ICD-10-CM | POA: Diagnosis not present

## 2024-04-30 ENCOUNTER — Encounter: Payer: Self-pay | Admitting: Family Medicine

## 2024-04-30 ENCOUNTER — Ambulatory Visit (INDEPENDENT_AMBULATORY_CARE_PROVIDER_SITE_OTHER): Payer: Self-pay | Admitting: Family Medicine

## 2024-04-30 VITALS — BP 138/82 | HR 71 | Temp 97.9°F | Ht 62.0 in | Wt 124.3 lb

## 2024-04-30 DIAGNOSIS — I10 Essential (primary) hypertension: Secondary | ICD-10-CM

## 2024-04-30 DIAGNOSIS — J449 Chronic obstructive pulmonary disease, unspecified: Secondary | ICD-10-CM | POA: Diagnosis not present

## 2024-04-30 DIAGNOSIS — E782 Mixed hyperlipidemia: Secondary | ICD-10-CM | POA: Diagnosis not present

## 2024-04-30 DIAGNOSIS — J8410 Pulmonary fibrosis, unspecified: Secondary | ICD-10-CM | POA: Diagnosis not present

## 2024-04-30 DIAGNOSIS — I4819 Other persistent atrial fibrillation: Secondary | ICD-10-CM

## 2024-04-30 DIAGNOSIS — I4891 Unspecified atrial fibrillation: Secondary | ICD-10-CM

## 2024-04-30 DIAGNOSIS — R7303 Prediabetes: Secondary | ICD-10-CM

## 2024-04-30 NOTE — Assessment & Plan Note (Signed)
 Currently well controlled on medication regimen. Denies any symptoms or adverse medication side effects. Last CMP was stable. Initial BP in office was 154/107 with repeat measurement in 138/82. - Continue metoprolol  50 mg daily - Order CMP

## 2024-04-30 NOTE — Assessment & Plan Note (Signed)
 Currently well controlled on medication regimen. Denies any symptoms or adverse medication side effects. Endorses shortness of breath when walking up stairs or hills, but endorses that this is her baseline. - Continue current medication regimen

## 2024-04-30 NOTE — Progress Notes (Signed)
 Established Patient Office Visit  Subjective   Patient ID: Karen Henson, female    DOB: 1941/03/30  Age: 83 y.o. MRN: [id number]  Chief Complaint  Patient presents with   Medical Management of Chronic Issues   Hypertension        Hyperlipidemia   COPD    Karen Henson is a 83 yo F with a history of HTN, Afib, HLD, prediabetes, and lung fibrosis who comes in for follow up management of her chronic conditions.  On interview, she reports feeling well. She reports good control of her HTN and Afib at home with her current medication regimen and is followed by cardiology. She continues to manage her HLD and prediabetes with lifestyle changes. She states that her lung fibrosis has been stable with her medications, rarely having to use her rescue inhalers. She reports no other concerns today and is interested in addressing her health maintenance goals.      ROS    Objective:     BP 138/82 (BP Location: Right Arm, Patient Position: Sitting, Cuff Size: Normal)   Pulse 71   Temp 97.9 F (36.6 C) (Oral)   Ht 5' 2 (1.575 m)   Wt 124 lb 4.8 oz (56.4 kg)   SpO2 98%   BMI 22.73 kg/m    Physical Exam Vitals reviewed.  Constitutional:      General: She is not in acute distress.    Appearance: Normal appearance. She is not ill-appearing or diaphoretic.  HENT:     Head: Normocephalic.     Right Ear: Tympanic membrane, ear canal and external ear normal.     Left Ear: Tympanic membrane, ear canal and external ear normal.     Nose: Nose normal.     Mouth/Throat:     Mouth: Mucous membranes are moist.     Pharynx: Oropharynx is clear. No oropharyngeal exudate or posterior oropharyngeal erythema.  Eyes:     General: No scleral icterus.    Conjunctiva/sclera: Conjunctivae normal.     Pupils: Pupils are equal, round, and reactive to light.  Cardiovascular:     Rate and Rhythm: Normal rate. Rhythm irregular.     Pulses: Normal pulses.     Heart sounds: Normal heart sounds. No  murmur heard.    No friction rub. No gallop.  Pulmonary:     Effort: Pulmonary effort is normal. No respiratory distress.     Breath sounds: Normal breath sounds. No wheezing.  Abdominal:     General: There is no distension.     Palpations: Abdomen is soft.     Tenderness: There is no abdominal tenderness. There is no guarding.  Musculoskeletal:     Cervical back: Normal range of motion.     Right lower leg: No edema.     Left lower leg: No edema.  Lymphadenopathy:     Cervical: No cervical adenopathy.  Skin:    General: Skin is warm and dry.     Capillary Refill: Capillary refill takes less than 2 seconds.  Neurological:     Mental Status: She is alert and oriented to person, place, and time.  Psychiatric:        Mood and Affect: Mood normal.      No results found for any visits on 04/30/24.    The ASCVD Risk score (Arnett DK, et al., 2019) failed to calculate for the following reasons:   The 2019 ASCVD risk score is only valid for ages 81 to 70  Assessment & Plan:   Problem List Items Addressed This Visit       Cardiovascular and Mediastinum   Essential hypertension - Primary   Currently well controlled on medication regimen. Denies any symptoms or adverse medication side effects. Last CMP was stable. Initial BP in office was 154/107 with repeat measurement in 138/82. - Continue metoprolol  50 mg daily - Order CMP       Relevant Orders   Comprehensive Metabolic Panel (CMET)   Persistent atrial fibrillation (HCC)   Currently well controlled on medication regimen. Denies any symptoms or adverse medication side effects. Followed by cardiology who has offered an ablation for symptomatic relief. - Continue Eliquis  and Metoprolol  - Discussed ablation for symptomatic relief at patient's request; patient currently undecided        Respiratory   Fibrosis lung (HCC)   Currently controlled on medication regimen. Denies any symptoms or adverse medication side effects.  Endorses shortness of breath walking up stairs or hills, but states that this has been her baseline for years. Denies any recent worsening of shortness of breath. - Continue current medication regimen       COPD (chronic obstructive pulmonary disease) (HCC)   Currently well controlled on medication regimen. Denies any symptoms or adverse medication side effects. Endorses shortness of breath when walking up stairs or hills, but endorses that this is her baseline. - Continue current medication regimen         Other   Moderate mixed hyperlipidemia not requiring statin therapy   Currently well controlled on lifestyle changes. Denies any symptoms. Last FLP was stable. - Continue lifestyle modifications - Order FLP       Relevant Orders   Lipid Profile   Comprehensive Metabolic Panel (CMET)   Other Visit Diagnoses       Prediabetes       Relevant Orders   HgB A1c      Prediabetes Currently well controlled with lifestyle modifications. Denies any symptoms. Last A1C was stable. - Continue lifestyle modifications - Order A1C  General Health Maintenance Discussed wellness and preventative health screenings to achieve health maintenance goals. - Encouraged flu and Covid vaccinations in the fall - Up to date on other wellness measures and preventative screenings   Return in about 6 months (around 10/31/2024) for chronic disease f/u.    Elia LULLA Blanch, Medical Student   Patient seen along with MS3 student, Elia Blanch. I personally evaluated this patient along with the student, and verified all aspects of the history, physical exam, and medical decision making as documented by the student. I agree with the student's documentation and have made all necessary edits.  Whyatt Klinger, Jon HERO, MD, MPH [facility]

## 2024-04-30 NOTE — Assessment & Plan Note (Signed)
 Currently well controlled on medication regimen. Denies any symptoms or adverse medication side effects. Followed by cardiology who has offered an ablation for symptomatic relief. - Continue Eliquis  and Metoprolol  - Discussed ablation for symptomatic relief at patient's request; patient currently undecided

## 2024-04-30 NOTE — Assessment & Plan Note (Signed)
 Currently controlled on medication regimen. Denies any symptoms or adverse medication side effects. Endorses shortness of breath walking up stairs or hills, but states that this has been her baseline for years. Denies any recent worsening of shortness of breath. - Continue current medication regimen

## 2024-04-30 NOTE — Assessment & Plan Note (Signed)
 Currently well controlled on lifestyle changes. Denies any symptoms. Last FLP was stable. - Continue lifestyle modifications - Order FLP

## 2024-05-01 ENCOUNTER — Ambulatory Visit: Payer: Self-pay | Admitting: Family Medicine

## 2024-05-01 LAB — HEMOGLOBIN A1C
Est. average glucose Bld gHb Est-mCnc: 114 mg/dL
Hgb A1c MFr Bld: 5.6 % (ref 4.8–5.6)

## 2024-05-01 LAB — COMPREHENSIVE METABOLIC PANEL WITH GFR
ALT: 19 IU/L (ref 0–32)
AST: 27 IU/L (ref 0–40)
Albumin: 4.8 g/dL — ABNORMAL HIGH (ref 3.7–4.7)
Alkaline Phosphatase: 43 IU/L — ABNORMAL LOW (ref 44–121)
BUN/Creatinine Ratio: 18 (ref 12–28)
BUN: 16 mg/dL (ref 8–27)
Bilirubin Total: 0.8 mg/dL (ref 0.0–1.2)
CO2: 23 mmol/L (ref 20–29)
Calcium: 9.9 mg/dL (ref 8.7–10.3)
Chloride: 96 mmol/L (ref 96–106)
Creatinine, Ser: 0.88 mg/dL (ref 0.57–1.00)
Globulin, Total: 1.8 g/dL (ref 1.5–4.5)
Glucose: 94 mg/dL (ref 70–99)
Potassium: 5.2 mmol/L (ref 3.5–5.2)
Sodium: 135 mmol/L (ref 134–144)
Total Protein: 6.6 g/dL (ref 6.0–8.5)
eGFR: 66 mL/min/1.73 (ref >59)

## 2024-05-01 LAB — LIPID PANEL
Chol/HDL Ratio: 3.2 ratio (ref 0.0–4.4)
Cholesterol, Total: 172 mg/dL (ref 100–199)
HDL: 54 mg/dL (ref >39)
LDL Chol Calc (NIH): 103 mg/dL — ABNORMAL HIGH (ref 0–99)
Triglycerides: 78 mg/dL (ref 0–149)
VLDL Cholesterol Cal: 15 mg/dL (ref 5–40)

## 2024-05-06 DIAGNOSIS — M25562 Pain in left knee: Secondary | ICD-10-CM | POA: Diagnosis not present

## 2024-05-06 DIAGNOSIS — G8929 Other chronic pain: Secondary | ICD-10-CM | POA: Diagnosis not present

## 2024-05-08 ENCOUNTER — Ambulatory Visit (INDEPENDENT_AMBULATORY_CARE_PROVIDER_SITE_OTHER): Admitting: Podiatry

## 2024-05-08 ENCOUNTER — Ambulatory Visit

## 2024-05-08 ENCOUNTER — Encounter: Payer: Self-pay | Admitting: Podiatry

## 2024-05-08 VITALS — Ht 62.0 in | Wt 124.3 lb

## 2024-05-08 DIAGNOSIS — M722 Plantar fascial fibromatosis: Secondary | ICD-10-CM

## 2024-05-08 DIAGNOSIS — M7751 Other enthesopathy of right foot: Secondary | ICD-10-CM | POA: Diagnosis not present

## 2024-05-08 DIAGNOSIS — L309 Dermatitis, unspecified: Secondary | ICD-10-CM

## 2024-05-08 MED ORDER — BETAMETHASONE SOD PHOS & ACET 6 (3-3) MG/ML IJ SUSP
3.0000 mg | Freq: Once | INTRAMUSCULAR | Status: AC
Start: 2024-05-08 — End: 2024-05-08
  Administered 2024-05-08: 3 mg via INTRA_ARTICULAR

## 2024-05-08 MED ORDER — CLOTRIMAZOLE-BETAMETHASONE 1-0.05 % EX CREA
1.0000 | TOPICAL_CREAM | Freq: Every day | CUTANEOUS | 2 refills | Status: DC
Start: 2024-05-08 — End: 2024-08-10

## 2024-05-08 NOTE — Progress Notes (Signed)
 Chief Complaint  Patient presents with   Foot Pain    Pt is here due to right foot pain thinks she may have plantar fasciitis states the pain usually goes away quickly this time the pain has been there for about a week with no relief.    HPI: 83 y.o. female presenting today as a new patient for 2 separate complaints.  First, the patient states that she has been developing some arch pain to the right foot over the past week.  Idiopathic onset.  She does state that she has an intermittent history of plantar fasciitis  Over the last month she has also developed skin lesions that are very itchy to the plantar aspect of the right foot.  Idiopathic onset.  She has not anything for treatment  Past Medical History:  Diagnosis Date   Allergy    Environmental Allergies   Arthritis    Bronchitis, chronic (HCC)    Closed right hip fracture (HCC) 02/09/2023   COPD (chronic obstructive pulmonary disease) (HCC)    Cough    CHRONIC   Dyspnea    OCCAS   Hypertension    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HCC)    Osteoporosis    Persistent atrial fibrillation (HCC)    a. Dx 01/2023 in setting of hip fx->rate controlled w/ bb. CHA2DS2VASc = 4-->eliquis  2.5 bid.   Tricuspid regurgitation    a. 01/2023 Echo: EF 55-60%, no rwma, nl RV fxn, RVSP . Mod dil RA. Mild MR. Mod-Sev TR.    Past Surgical History:  Procedure Laterality Date   BREAST EXCISIONAL BIOPSY Left 1996   neg   BREAST SURGERY     CAPSULOTOMY     CATARACT EXTRACTION W/PHACO Left 10/31/2017   Procedure: CATARACT EXTRACTION PHACO AND INTRAOCULAR LENS PLACEMENT (IOC);  Surgeon: Myrna Adine Anes, MD;  Location: [facility];  Service: Ophthalmology;  Laterality: Left;  US  00:43.7 AP% 19.6 CDE 8.57 FLUID PACK LOT # [id number]   CATARACT EXTRACTION W/PHACO Right 12/05/2017   Procedure: CATARACT EXTRACTION PHACO AND INTRAOCULAR LENS PLACEMENT (IOC);  Surgeon: Myrna Adine Anes, MD;  Location: [facility];  Service: Ophthalmology;  Laterality: Right;  fluid  pack lot #[id number] H  exp 07/01/2019 US     00:31.4 AP%    8.8 CDE    2.79   COLONOSCOPY  02/15/2006   COLONOSCOPY WITH PROPOFOL  N/A 03/21/2016   Procedure: COLONOSCOPY WITH PROPOFOL ;  Surgeon: Reyes LELON Cota, MD;  Location: [facility];  Service: Endoscopy;  Laterality: N/A;   COLONOSCOPY WITH PROPOFOL  N/A 03/29/2021   Procedure: COLONOSCOPY WITH PROPOFOL ;  Surgeon: Cota Reyes LELON, MD;  Location: [facility];  Service: Endoscopy;  Laterality: N/A;   DILATION AND CURETTAGE OF UTERUS     EYE SURGERY     HIP ARTHROPLASTY Right 02/10/2023   Procedure: ARTHROPLASTY BIPOLAR HIP (HEMIARTHROPLASTY);  Surgeon: Tobie Priest, MD;  Location: [facility];  Service: Orthopedics;  Laterality: Right;   VARICOSE VEIN SURGERY      Allergies  Allergen Reactions   Lisinopril  Cough     Physical Exam: General: The patient is alert and oriented x3 in no acute distress.  Dermatology: Skin is warm, dry and supple bilateral lower extremities.  There is some peeling skin noted to the volar surfaces of the right foot and a few very small pustular lesions.  Unknown etiology but consistent with some sort of a pustular dermatitis  Vascular: Palpable pedal pulses bilaterally. Capillary refill within normal limits.  No appreciable edema.  No erythema.  Neurological: Grossly  intact via light touch  Musculoskeletal Exam: Hallux valgus deformity noted.  Tenderness to palpation along the arch of the right foot  Radiographic Exam RT foot 05/08/2024:  Hallux valgus deformity noted.  Joint spaces mostly preserved.  No acute fractures identified.  Impression: Negative  Assessment/Plan of Care: 1.  Plantar fasciitis mid arch right foot 2.  Pustular dermatitis right foot  -Patient evaluated.  X-rays reviewed -Injection of 0.5 cc Celestone  Soluspan injected in the plantar fascia right -Advised against going barefoot.  Recommend good supportive tennis shoes and sneakers -Compression ankle sleeve dispensed.  Wear  daily -Patient is on anticoagulant.  No NSAIDs -Continue OTC Tylenol  as needed -Prescription for Lotrisone  cream apply twice daily -Return to clinic PRN       Thresa EMERSON Sar, DPM [facility]  Dr. Thresa EMERSON Sar, DPM    [address] [place], [place] [address]                Office [phone]  Fax [phone]

## 2024-06-22 DIAGNOSIS — D2271 Melanocytic nevi of right lower limb, including hip: Secondary | ICD-10-CM | POA: Diagnosis not present

## 2024-06-22 DIAGNOSIS — Z85828 Personal history of other malignant neoplasm of skin: Secondary | ICD-10-CM | POA: Diagnosis not present

## 2024-06-22 DIAGNOSIS — D2262 Melanocytic nevi of left upper limb, including shoulder: Secondary | ICD-10-CM | POA: Diagnosis not present

## 2024-06-22 DIAGNOSIS — Z08 Encounter for follow-up examination after completed treatment for malignant neoplasm: Secondary | ICD-10-CM | POA: Diagnosis not present

## 2024-06-22 DIAGNOSIS — D2272 Melanocytic nevi of left lower limb, including hip: Secondary | ICD-10-CM | POA: Diagnosis not present

## 2024-06-22 DIAGNOSIS — D2261 Melanocytic nevi of right upper limb, including shoulder: Secondary | ICD-10-CM | POA: Diagnosis not present

## 2024-06-22 DIAGNOSIS — L821 Other seborrheic keratosis: Secondary | ICD-10-CM | POA: Diagnosis not present

## 2024-06-22 DIAGNOSIS — D225 Melanocytic nevi of trunk: Secondary | ICD-10-CM | POA: Diagnosis not present

## 2024-07-24 DIAGNOSIS — Z23 Encounter for immunization: Secondary | ICD-10-CM | POA: Diagnosis not present

## 2024-08-10 ENCOUNTER — Encounter: Payer: Self-pay | Admitting: Internal Medicine

## 2024-08-10 ENCOUNTER — Ambulatory Visit: Admitting: Internal Medicine

## 2024-08-10 VITALS — BP 148/90 | HR 67 | Temp 98.0°F | Resp 16 | Ht 62.0 in | Wt 134.0 lb

## 2024-08-10 DIAGNOSIS — J440 Chronic obstructive pulmonary disease with acute lower respiratory infection: Secondary | ICD-10-CM | POA: Diagnosis not present

## 2024-08-10 DIAGNOSIS — R0602 Shortness of breath: Secondary | ICD-10-CM | POA: Diagnosis not present

## 2024-08-10 DIAGNOSIS — J84112 Idiopathic pulmonary fibrosis: Secondary | ICD-10-CM | POA: Diagnosis not present

## 2024-08-10 NOTE — Patient Instructions (Signed)
Pulmonary Fibrosis  Pulmonary fibrosis is a type of lung disease that causes scarring. Over time, the scar tissue builds up in the air sacs of your lungs (alveoli). This makes it hard for you to breathe because less oxygen gets into your bloodstream. Scarring from pulmonary fibrosis is permanent and may lead to other serious health problems. What are the causes? There are many different causes of pulmonary fibrosis. In some cases, the cause is not known. This is called idiopathic pulmonary fibrosis. Other causes include: Exposure to chemicals and substances found in agricultural, farm, Holiday representative, or factory work. These include mold, asbestos, silica, metal dusts, and toxic fumes. Sarcoidosis. In this disease, areas of inflammatory cells (granulomas) form and most often affect the lungs. Autoimmune diseases. These include diseases such as rheumatoid arthritis, systemic sclerosis, or connective tissue disease. Taking certain medicines. These include drugs used in radiation therapy or used to treat seizures, heart problems, and some infections. What increases the risk? You are more likely to develop this condition if: You have a family history of the disease. You are an older person. The condition is more common in older adults. You have a history of smoking. You have a job that exposes you to certain chemicals. You have gastroesophageal reflux disease (GERD). What are the signs or symptoms? Symptoms of this condition include: Difficulty breathing that gets worse with activity. Shortness of breath (dyspnea). Dry, hacking cough. Rapid, shallow breathing during exercise or while at rest. Other symptoms may include: Loss of appetite or weight loss Tiredness (fatigue) or weakness. Bluish skin and lips. Rounded and enlarged fingertips (clubbing). How is this diagnosed? This condition may be diagnosed based on: Your symptoms and medical history. A physical exam. You may also have tests,  including: A test that involves looking inside your lungs with an instrument (bronchoscopy). Imaging studies of your lungs and heart. Tests to measure how well you are breathing (pulmonary function tests). Blood tests. Tests to see how well your lungs work while you are walking (pulmonary stress test). A procedure to remove a lung tissue sample to look at it under a microscope (biopsy). How is this treated? There is no cure for pulmonary fibrosis. Treatment focuses on managing symptoms and preventing scarring from getting worse. This may include: Medicines, such as: Steroids to prevent permanent lung changes. Medicines to suppress your body's defense system (immune system). Medicines to help with lung function by reducing inflammation or scarring. Ongoing monitoring with X-rays and lab work. Oxygen therapy. Pulmonary rehabilitation. Surgery. In some cases, a lung transplant is possible. Follow these instructions at home:  Medicines Take over-the-counter and prescription medicines only as told by your health care provider. Keep your vaccinations up to date as recommended by your health care provider. Activity Get regular exercise, but do not pick activities that are too strenuous for you. Ask your health care provider what activities are safe for you. If you have physical limitations, you may get exercise by walking, using a stationary bike, or doing chair exercises. Ask your health care provider about using oxygen while exercising. Do breathing exercises as told by your health care provider. Plan rest periods when you get tired. General instructions Do not use any products that contain nicotine or tobacco. These products include cigarettes, chewing tobacco, and vaping devices, such as e-cigarettes. If you need help quitting, ask your health care provider. If you are exposed to chemicals and substances at work, make sure that you wear a mask or respirator at all times. Learn to  manage  stress. If you need help to do this, ask your health care provider. Join a pulmonary rehabilitation program or a support group for people with pulmonary fibrosis. Eat small meals often so you do not get too full. Overeating can make breathing trouble worse. Maintain a healthy weight. Lose weight if you need to. Keep all follow-up visits. This is important. Where to find more information American Lung Association: www.lung.org National Heart, Lung, and Blood Institute: [employer].is Pulmonary Fibrosis Foundation: pulmonaryfibrosis.org Contact a health care provider if: You have symptoms that do not get better with medicines. You are not able to be as active as usual. You have trouble taking a deep breath. You have a fever or chills. You have blue lips or skin. You have a lot of headaches. You cough up mucus that is dark in color. You have feelings of depression or sadness. You are unable to sleep because it is hard to breathe. Get help right away if: Your symptoms suddenly worsen. You have chest pain. You cough up blood. You get very confused or sleepy. These symptoms may be an emergency. Get help right away. Call 911. Do not wait to see if the symptoms will go away. Do not drive yourself to the hospital. Summary Pulmonary fibrosis is a type of lung disease that causes scar tissue to build up in the air sacs of your lungs (alveoli) over time. This makes it hard for you to breathe because less oxygen gets into your bloodstream. Scarring from pulmonary fibrosis is permanent and may lead to other serious health problems. You are more likely to develop this condition if you have a family history of the condition or a job that exposes you to certain chemicals. There is no cure for pulmonary fibrosis. Treatment focuses on managing symptoms and preventing scarring from getting worse. This information is not intended to replace advice given to you by your health care provider. Make sure  you discuss any questions you have with your health care provider. Document Revised: 05/09/2021 Document Reviewed: 05/09/2021 Elsevier Patient Education  2024 [employer].

## 2024-08-10 NOTE — Progress Notes (Signed)
 [facility] [address] [place], [place] [address]  Pulmonary Sleep Medicine   Office Visit Note  Patient Name: Karen Henson DOB: Mar 04, 1941 MRN [id number]  Date of Service: 08/10/2024  Complaints/HPI: She has noted that she has more shortness of breath going up stairs and going up hill. Patient has slowed down a bit and this has made a difference. She denies having chest pain and she denies having palpitation. I did review her spirometry and there is some decline but withing margin of error likely.  Office Spirometry Results: Peak Flow: (!) 2 L/min FEV1: 0.85 liters FVC: 1.36 liters FEV1/FVC: 62.5 % FVC  % Predicted: 59 % FEV % Predicted: 50 % FeF 25-75: 0.46 liters FeF 25-75 % Predicted: 39   ROS  General: (-) fever, (-) chills, (-) night sweats, (-) weakness Skin: (-) rashes, (-) itching,. Eyes: (-) visual changes, (-) redness, (-) itching. Nose and Sinuses: (-) nasal stuffiness or itchiness, (-) postnasal drip, (-) nosebleeds, (-) sinus trouble. Mouth and Throat: (-) sore throat, (-) hoarseness. Neck: (-) swollen glands, (-) enlarged thyroid , (-) neck pain. Respiratory: + cough, (-) bloody sputum, + shortness of breath, - wheezing. Cardiovascular: - ankle swelling, (-) chest pain. Lymphatic: (-) lymph node enlargement. Neurologic: (-) numbness, (-) tingling. Psychiatric: (-) anxiety, (-) depression   Current Medication: Outpatient Encounter Medications as of 08/10/2024  Medication Sig   acetaminophen  (TYLENOL ) 500 MG tablet Take 500 mg by mouth every 6 (six) hours as needed for moderate pain or headache.   apixaban  (ELIQUIS ) 2.5 MG TABS tablet TAKE 1 TABLET BY MOUTH TWICE DAILY   budeson-glycopyrrolate-formoterol  (BREZTRI  AEROSPHERE) 160-9-4.8 MCG/ACT AERO Inhale 2 puffs into the lungs 2 (two) times daily.   ipratropium-albuterol  (DUONEB) 0.5-2.5 (3) MG/3ML SOLN Take 3 mLs by nebulization every 6 (six) hours as needed.   metoprolol  succinate  (TOPROL -XL) 50 MG 24 hr tablet TAKE 1 TABLET BY MOUTH AT BEDTIME   montelukast  (SINGULAIR ) 10 MG tablet TAKE 1 TABLET BY MOUTH BEDTIME FOR ASTHMA   [DISCONTINUED] albuterol  (VENTOLIN  HFA) 108 (90 Base) MCG/ACT inhaler Inhale 2 puffs into the lungs every 6 (six) hours as needed for wheezing or shortness of breath.   [DISCONTINUED] clotrimazole -betamethasone  (LOTRISONE ) cream Apply 1 Application topically daily.   No facility-administered encounter medications on file as of 08/10/2024.    Surgical History: Past Surgical History:  Procedure Laterality Date   BREAST EXCISIONAL BIOPSY Left 1996   neg   BREAST SURGERY     CAPSULOTOMY     CATARACT EXTRACTION W/PHACO Left 10/31/2017   Procedure: CATARACT EXTRACTION PHACO AND INTRAOCULAR LENS PLACEMENT (IOC);  Surgeon: Myrna Adine Anes, MD;  Location: [facility] ORS;  Service: Ophthalmology;  Laterality: Left;  US  00:43.7 AP% 19.6 CDE 8.57 FLUID PACK LOT # [id number]   CATARACT EXTRACTION W/PHACO Right 12/05/2017   Procedure: CATARACT EXTRACTION PHACO AND INTRAOCULAR LENS PLACEMENT (IOC);  Surgeon: Myrna Adine Anes, MD;  Location: [facility];  Service: Ophthalmology;  Laterality: Right;  fluid pack lot #[id number]  exp 07/01/2019 US     00:31.4 AP%    8.8 CDE    2.79   COLONOSCOPY  02/15/2006   COLONOSCOPY WITH PROPOFOL  N/A 03/21/2016   Procedure: COLONOSCOPY WITH PROPOFOL ;  Surgeon: Reyes LELON Cota, MD;  Location: [facility];  Service: Endoscopy;  Laterality: N/A;   COLONOSCOPY WITH PROPOFOL  N/A 03/29/2021   Procedure: COLONOSCOPY WITH PROPOFOL ;  Surgeon: Cota Reyes LELON, MD;  Location: [facility];  Service: Endoscopy;  Laterality: N/A;   DILATION AND CURETTAGE  OF UTERUS     EYE SURGERY     HIP ARTHROPLASTY Right 02/10/2023   Procedure: ARTHROPLASTY BIPOLAR HIP (HEMIARTHROPLASTY);  Surgeon: Tobie Priest, MD;  Location: [facility];  Service: Orthopedics;  Laterality: Right;   VARICOSE VEIN SURGERY      Medical History: Past Medical  History:  Diagnosis Date   Allergy    Environmental Allergies   Arthritis    Bronchitis, chronic (HCC)    Closed right hip fracture (HCC) 02/09/2023   COPD (chronic obstructive pulmonary disease) (HCC)    Cough    CHRONIC   Dyspnea    OCCAS   Hypertension    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HCC)    Osteoporosis    Persistent atrial fibrillation (HCC)    a. Dx 01/2023 in setting of hip fx->rate controlled w/ bb. CHA2DS2VASc = 4-->eliquis  2.5 bid.   Tricuspid regurgitation    a. 01/2023 Echo: EF 55-60%, no rwma, nl RV fxn, RVSP . Mod dil RA. Mild MR. Mod-Sev TR.    Family History: Family History  Problem Relation Age of Onset   Hypertension Mother    Emphysema Father    Stroke Sister    Stroke Brother    Diabetes Maternal Aunt    Diabetes Maternal Grandmother    Lung cancer Grandchild    Breast cancer Neg Hx    Ovarian cancer Neg Hx    Colon cancer Neg Hx     Social History: Social History   Socioeconomic History   Marital status: Widowed    Spouse name: Not on file   Number of children: 2   Years of education: Not on file   Highest education level: 11th grade  Occupational History   Occupation: retired  Tobacco Use   Smoking status: Former    Current packs/day: 0.00    Average packs/day: 0.1 packs/day for 10.0 years (1.0 ttl pk-yrs)    Types: Cigarettes    Start date: 60    Quit date: 1985    Years since quitting: 40.8   Smokeless tobacco: Never   Tobacco comments:    smoked a pack a week, quit about 30 years ago (in 1985)  Vaping Use   Vaping status: Never Used  Substance and Sexual Activity   Alcohol use: Yes    Comment: ocassional- monthly or less, 1 glass of wine or beer   Drug use: No   Sexual activity: Never  Other Topics Concern   Not on file  Social History Narrative   Not on file   Social Drivers of Health   Financial Resource Strain: Low Risk  (02/28/2024)   Received from [facility]   Overall Financial Resource Strain (CARDIA)     Difficulty of Paying Living Expenses: Not hard at all  Food Insecurity: No Food Insecurity (02/28/2024)   Received from [facility]   Hunger Vital Sign    Within the past 12 months, you worried that your food would run out before you got the money to buy more.: Never true    Within the past 12 months, the food you bought just didn't last and you didn't have money to get more.: Never true  Transportation Needs: No Transportation Needs (02/28/2024)   Received from [facility] - Transportation    In the past 12 months, has lack of transportation kept you from medical appointments or from getting medications?: No    Lack of Transportation (Non-Medical): No  Physical Activity: Insufficiently Active (  09/11/2023)   Exercise Vital Sign    Days of Exercise per Week: 4 days    Minutes of Exercise per Session: 20 min  Stress: No Stress Concern Present (09/11/2023)   [employer] of Occupational Health - Occupational Stress Questionnaire    Feeling of Stress : Not at all  Social Connections: Moderately Integrated (09/11/2023)   Social Connection and Isolation Panel    Frequency of Communication with Friends and Family: More than three times a week    Frequency of Social Gatherings with Friends and Family: More than three times a week    Attends Religious Services: More than 4 times per year    Active Member of [employer] or Organizations: Yes    Attends Banker Meetings: More than 4 times per year    Marital Status: Widowed  Intimate Partner Violence: Not At Risk (09/11/2023)   Humiliation, Afraid, Rape, and Kick questionnaire    Fear of Current or Ex-Partner: No    Emotionally Abused: No    Physically Abused: No    Sexually Abused: No    Vital Signs: Blood pressure (!) 148/90, pulse 67, temperature 98 F (36.7 C), resp. rate 16, height 5' 2 (1.575 m), weight 134 lb (60.8 kg), SpO2 99%, peak flow (!) 2  L/min.  Examination: General Appearance: The patient is well-developed, well-nourished, and in no distress. Skin: Gross inspection of skin unremarkable. Head: normocephalic, no gross deformities. Eyes: no gross deformities noted. ENT: ears appear grossly normal no exudates. Neck: Supple. No thyromegaly. No LAD. Respiratory: few crackles. Cardiovascular: Normal S1 and S2 without murmur or rub. Extremities: No cyanosis. pulses are equal. Neurologic: Alert and oriented. No involuntary movements.  LABS: No results found for this or any previous visit (from the past 2160 hours).  Radiology: MM 3D SCREENING MAMMOGRAM BILATERAL BREAST Result Date: 02/05/2024 CLINICAL DATA:  Screening. EXAM: DIGITAL SCREENING BILATERAL MAMMOGRAM WITH TOMOSYNTHESIS AND CAD TECHNIQUE: Bilateral screening digital craniocaudal and mediolateral oblique mammograms were obtained. Bilateral screening digital breast tomosynthesis was performed. The images were evaluated with computer-aided detection. COMPARISON:  Previous exam(s). ACR Breast Density Category b: There are scattered areas of fibroglandular density. FINDINGS: There are no findings suspicious for malignancy. IMPRESSION: No mammographic evidence of malignancy. A result letter of this screening mammogram will be mailed directly to the patient. RECOMMENDATION: Screening mammogram in one year. (Code:SM-B-01Y) BI-RADS CATEGORY  1: Negative. Electronically Signed   By: Dirk Arrant M.D.   On: 02/05/2024 16:16    No results found.  No results found.  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 IPF (idiopathic pulmonary fibrosis) (HCC) (Primary) I reviewed her previous spirometry's appear to be within margin of her although she has overall downward trend.  The patient understands that the prognosis is guarded and that she can anticipate a gradual  decline  2. SOB (shortness of breath) We will continue to monitor her spirometry's and PFTs - Spirometry with Graph  3. Chronic obstructive pulmonary disease with acute lower respiratory infection (HCC) Inhaler should be continued.  Patient is currently on Breztri  she states it does help she also has DuoNeb   General Counseling: I have discussed the findings of the evaluation and examination with Karen Henson.  I have also discussed any further diagnostic evaluation thatmay be needed or ordered today. Karen Henson verbalizes understanding of the findings of todays visit. We also reviewed her medications today and discussed drug interactions and side effects including but not limited excessive drowsiness and altered mental states. We also discussed that there is always a risk not just to her but also people around her. she has been encouraged to call the office with any questions or concerns that should arise related to todays visit.  Orders Placed This Encounter  Procedures   Spirometry with Graph    Where should this test be performed?:   [facility]     Time spent: 56  I have personally obtained a history, examined the patient, evaluated laboratory and imaging results, formulated the assessment and plan and placed orders.    Karen Henson DELENA Bathe, MD  [facility] Pulmonary and Critical Care Sleep medicine

## 2024-08-11 NOTE — Addendum Note (Signed)
 Addended by: ALMER BI on: 08/11/2024 09:03 AM   Modules accepted: Orders

## 2024-09-16 ENCOUNTER — Ambulatory Visit (INDEPENDENT_AMBULATORY_CARE_PROVIDER_SITE_OTHER): Payer: Self-pay | Admitting: Emergency Medicine

## 2024-09-16 VITALS — Ht 62.0 in | Wt 130.0 lb

## 2024-09-16 DIAGNOSIS — Z1231 Encounter for screening mammogram for malignant neoplasm of breast: Secondary | ICD-10-CM

## 2024-09-16 DIAGNOSIS — Z0000 Encounter for general adult medical examination without abnormal findings: Secondary | ICD-10-CM | POA: Diagnosis not present

## 2024-09-16 DIAGNOSIS — Z780 Asymptomatic menopausal state: Secondary | ICD-10-CM

## 2024-09-16 NOTE — Patient Instructions (Signed)
 Ms. Rummell,  Thank you for taking the time for your Medicare Wellness Visit. I appreciate your continued commitment to your health goals. Please review the care plan we discussed, and feel free to reach out if I can assist you further.  Please note that Annual Wellness Visits do not include a physical exam. Some assessments may be limited, especially if the visit was conducted virtually. If needed, we may recommend an in-person follow-up with your provider.  Ongoing Care Seeing your primary care provider every 3 to 6 months helps us  monitor your health and provide consistent, personalized care.   Referrals If a referral was made during today's visit and you haven't received any updates within two weeks, please contact the referred provider directly to check on the status.  Recommended Screenings:  Please call to schedule your mammogram (due after 02/03/25) and bone density scan: You may have these done in the same visit.  [facility] at [facility] Address: [address], [place], [place] Phone: [phone]  [facility] at [facility] [address] [place], [place] [address] Phone: [phone]   Health Maintenance  Topic Date Due   COVID-19 Vaccine (7 - 2025-26 season) 06/01/2024   Medicare Annual Wellness Visit  09/10/2024   Osteoporosis screening with Bone Density Scan  12/25/2024   Breast Cancer Screening  02/03/2025   DTaP/Tdap/Td vaccine (3 - Td or Tdap) 11/08/2032   Pneumococcal Vaccine for age over 71  Completed   Flu Shot  Completed   Zoster (Shingles) Vaccine  Completed   Meningitis B Vaccine  Aged Out   Colon Cancer Screening  Discontinued       09/16/2024    8:13 AM  Advanced Directives  Does Patient Have a Medical Advance Directive? Yes  Type of Estate Agent of [place];Living will  Does patient want to make changes to medical advance directive? No - Patient declined  Copy of Healthcare  Power of Attorney in Chart? No - copy requested    Vision: Annual vision screenings are recommended for early detection of glaucoma, cataracts, and diabetic retinopathy. These exams can also reveal signs of chronic conditions such as diabetes and high blood pressure.  Dental: Annual dental screenings help detect early signs of oral cancer, gum disease, and other conditions linked to overall health, including heart disease and diabetes.  Please see the attached documents for additional preventive care recommendations.

## 2024-09-16 NOTE — Progress Notes (Signed)
 Chief Complaint  Patient presents with   Medicare Wellness     Subjective:   Karen Henson is a 83 y.o. female who presents for a Medicare Annual Wellness Visit.  Visit info / Clinical Intake: Medicare Wellness Visit Type:: Subsequent Annual Wellness Visit Persons participating in visit and providing information:: patient Medicare Wellness Visit Mode:: Telephone If telephone:: video declined Since this visit was completed virtually, some vitals may be partially provided or unavailable. Missing vitals are due to the limitations of the virtual format.: Documented vitals are patient reported If Telephone or Video please confirm:: I connected with patient using audio/video enable telemedicine. I verified patient identity with two identifiers, discussed telehealth limitations, and patient agreed to proceed. Patient Location:: home Provider Location:: home office Interpreter Needed?: No Pre-visit prep was completed: yes AWV questionnaire completed by patient prior to visit?: no Living arrangements:: (!) lives alone Patient's Overall Health Status Rating: good Typical amount of pain: none Does pain affect daily life?: no Are you currently prescribed opioids?: no  Dietary Habits and Nutritional Risks How many meals a day?: 3 Eats fruit and vegetables daily?: yes Most meals are obtained by: preparing own meals; eating out (50/50) In the last 2 weeks, have you had any of the following?: none Diabetic:: no  Functional Status Activities of Daily Living (to include ambulation/medication): Independent Ambulation: Independent with device- listed below Home Assistive Devices/Equipment: Cane; Eyeglasses (uses cane when outside on uneven ground, glasses for reading) Medication Administration: Independent Home Management (perform basic housework or laundry): Independent Manage your own finances?: yes Primary transportation is: family / friends Concerns about vision?: no *vision screening  is required for WTM* Concerns about hearing?: no  Fall Screening Falls in the past year?: 0 Number of falls in past year: 0 Was there an injury with Fall?: 0 Fall Risk Category Calculator: 0 Patient Fall Risk Level: Low Fall Risk  Fall Risk Patient at Risk for Falls Due to: Impaired balance/gait Fall risk Follow up: Falls evaluation completed; Education provided; Falls prevention discussed  Home and Transportation Safety: All rugs have non-skid backing?: N/A, no rugs All stairs or steps have railings?: yes (also has a ramp) Grab bars in the bathtub or shower?: yes Have non-skid surface in bathtub or shower?: yes Good home lighting?: yes Regular seat belt use?: yes Hospital stays in the last year:: no  Cognitive Assessment Difficulty concentrating, remembering, or making decisions? : yes (sometimes forgetful) Will 6CIT or Mini Cog be Completed: yes What year is it?: 0 points What month is it?: 0 points Give patient an address phrase to remember (5 components): [address] [place] About what time is it?: 0 points Count backwards from 20 to 1: 0 points Say the months of the year in reverse: 0 points Repeat the address phrase from earlier: 0 points 6 CIT Score: 0 points  Advance Directives (For Healthcare) Does Patient Have a Medical Advance Directive?: Yes Does patient want to make changes to medical advance directive?: No - Patient declined Type of Advance Directive: Healthcare Power of [place]; Living will Copy of Healthcare Power of Attorney in Chart?: No - copy requested Copy of Living Will in Chart?: No - copy requested  Reviewed/Updated  Reviewed/Updated: Reviewed All (Medical, Surgical, Family, Medications, Allergies, Care Teams, Patient Goals)    Allergies (verified) Lisinopril    Current Medications (verified) Outpatient Encounter Medications as of 09/16/2024  Medication Sig   acetaminophen  (TYLENOL ) 500 MG tablet Take 500 mg by mouth every 6 (six) hours  as needed for moderate pain or headache.   apixaban  (ELIQUIS ) 2.5 MG TABS tablet TAKE 1 TABLET BY MOUTH TWICE DAILY   budeson-glycopyrrolate-formoterol  (BREZTRI  AEROSPHERE) 160-9-4.8 MCG/ACT AERO Inhale 2 puffs into the lungs 2 (two) times daily. (Patient taking differently: Inhale 2 puffs into the lungs 2 (two) times daily. PRN)   ipratropium-albuterol  (DUONEB) 0.5-2.5 (3) MG/3ML SOLN Take 3 mLs by nebulization every 6 (six) hours as needed.   metoprolol  succinate (TOPROL -XL) 50 MG 24 hr tablet TAKE 1 TABLET BY MOUTH AT BEDTIME   montelukast  (SINGULAIR ) 10 MG tablet TAKE 1 TABLET BY MOUTH BEDTIME FOR ASTHMA   No facility-administered encounter medications on file as of 09/16/2024.    History: Past Medical History:  Diagnosis Date   Allergy    Environmental Allergies   Arthritis    Bronchitis, chronic (HCC)    Closed right hip fracture (HCC) 02/09/2023   COPD (chronic obstructive pulmonary disease) (HCC)    Cough    CHRONIC   Dyspnea    OCCAS   Hypertension    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HCC)    Osteoporosis    Persistent atrial fibrillation (HCC)    a. Dx 01/2023 in setting of hip fx->rate controlled w/ bb. CHA2DS2VASc = 4-->eliquis  2.5 bid.   Tricuspid regurgitation    a. 01/2023 Echo: EF 55-60%, no rwma, nl RV fxn, RVSP . Mod dil RA. Mild MR. Mod-Sev TR.   Past Surgical History:  Procedure Laterality Date   BREAST EXCISIONAL BIOPSY Left 1996   neg   BREAST SURGERY     CAPSULOTOMY     CATARACT EXTRACTION W/PHACO Left 10/31/2017   Procedure: CATARACT EXTRACTION PHACO AND INTRAOCULAR LENS PLACEMENT (IOC);  Surgeon: Myrna Adine Anes, MD;  Location: [facility] OR[facility];  Service: Ophthalmology;  Laterality: Left;  US  00:43.7 AP% 19.6 CDE 8.57 FLUID PACK LOT # [id number]   CATARACT EXTRACTION W/PHACO Right 12/05/2017   Procedure: CATARACT EXTRACTION PHACO AND INTRAOCULAR LENS PLACEMENT (IOC);  Surgeon: Myrna Adine Anes, MD;  Location: [facility];  Service: Ophthalmology;  Laterality: Right;   fluid pack lot #[id number]  exp 07/01/2019 US     00:31.4 AP%    8.8 CDE    2.79   COLONOSCOPY  02/15/2006   COLONOSCOPY WITH PROPOFOL  N/A 03/21/2016   Procedure: COLONOSCOPY WITH PROPOFOL ;  Surgeon: Reyes LELON Cota, MD;  Location: [facility];  Service: Endoscopy;  Laterality: N/A;   COLONOSCOPY WITH PROPOFOL  N/A 03/29/2021   Procedure: COLONOSCOPY WITH PROPOFOL ;  Surgeon: Cota Reyes LELON, MD;  Location: [facility];  Service: Endoscopy;  Laterality: N/A;   DILATION AND CURETTAGE OF UTERUS     EYE SURGERY     HIP ARTHROPLASTY Right 02/10/2023   Procedure: ARTHROPLASTY BIPOLAR HIP (HEMIARTHROPLASTY);  Surgeon: Tobie Priest, MD;  Location: [facility];  Service: Orthopedics;  Laterality: Right;   VARICOSE VEIN SURGERY     Family History  Problem Relation Age of Onset   Hypertension Mother    Emphysema Father    Stroke Sister    Stroke Brother    Diabetes Maternal Aunt    Diabetes Maternal Grandmother    Lung cancer Grandchild    Breast cancer Neg Hx    Ovarian cancer Neg Hx    Colon cancer Neg Hx    Social History   Occupational History   Occupation: retired  Tobacco Use   Smoking status: Former    Current packs/day: 0.00    Average packs/day: 0.1 packs/day for 10.0 years (1.0 ttl pk-yrs)    Types: Cigarettes  Start date: 70    Quit date: 37    Years since quitting: 73.9   Smokeless tobacco: Never   Tobacco comments:    smoked a pack a week, quit about 30 years ago (in 1985)  Vaping Use   Vaping status: Never Used  Substance and Sexual Activity   Alcohol use: Yes    Comment: ocassional- monthly or less, 1 glass of wine or beer   Drug use: No   Sexual activity: Never   Tobacco Counseling Counseling given: Not Answered Tobacco comments: smoked a pack a week, quit about 30 years ago (in 1985)  SDOH Screenings   Food Insecurity: No Food Insecurity (09/16/2024)  Housing: Low Risk (09/16/2024)  Transportation Needs: No Transportation Needs (09/16/2024)   Utilities: Not At Risk (09/16/2024)  Alcohol Screen: Low Risk (09/11/2023)  Depression (PHQ2-9): Low Risk (09/16/2024)  Financial Resource Strain: Low Risk  (02/28/2024)   [facility]  Physical Activity: Insufficiently Active (09/16/2024)  Social Connections: Moderately Integrated (09/16/2024)  Stress: No Stress Concern Present (09/16/2024)  Tobacco Use: Medium Risk (09/16/2024)  Health Literacy: Adequate Health Literacy (09/16/2024)   See flowsheets for full screening details  Depression Screen PHQ 2 & 9 Depression Scale- Over the past 2 weeks, how often have you been bothered by any of the following problems? Little interest or pleasure in doing things: 0 Feeling down, depressed, or hopeless (PHQ Adolescent also includes...irritable): 0 PHQ-2 Total Score: 0 Trouble falling or staying asleep, or sleeping too much: 1 Feeling tired or having little energy: 0 Poor appetite or overeating (PHQ Adolescent also includes...weight loss): 0 Feeling bad about yourself - or that you are a failure or have let yourself or your family down: 0 Trouble concentrating on things, such as reading the newspaper or watching television (PHQ Adolescent also includes...like school work): 0 Moving or speaking so slowly that other people could have noticed. Or the opposite - being so fidgety or restless that you have been moving around a lot more than usual: 0 Thoughts that you would be better off dead, or of hurting yourself in some way: 0 PHQ-9 Total Score: 1 If you checked off any problems, how difficult have these problems made it for you to do your work, take care of things at home, or get along with other people?: Not difficult at all  Depression Treatment Depression Interventions/Treatment : [id number] Score <4 Follow-up Not Indicated     Goals Addressed               This Visit's Progress     Patient Stated (pt-stated)   Not on track     Exercise more              Objective:    Today's Vitals   09/16/24 0810  Weight: 130 lb (59 kg)  Height: 5' 2 (1.575 m)   Body mass index is 23.78 kg/m.  Hearing/Vision screen Hearing Screening - Comments:: Denies hearing loss  Vision Screening - Comments:: UTD @ [facility] Immunizations and Health Maintenance Health Maintenance  Topic Date Due   COVID-19 Vaccine (7 - 2025-26 season) 06/01/2024   Bone Density Scan  12/25/2024   Mammogram  02/03/2025   Medicare Annual Wellness (AWV)  09/16/2025   DTaP/Tdap/Td (3 - Td or Tdap) 11/08/2032   Pneumococcal Vaccine: 50+ Years  Completed   Influenza Vaccine  Completed   Zoster Vaccines- Shingrix  Completed   Meningococcal B Vaccine  Aged Out  Colonoscopy  Discontinued        Assessment/Plan:  This is a routine wellness examination for [place].  Patient Care Team: Myrla Jon HERO, MD as PCP - General (Family Medicine) Fernand Elfreda LABOR, MD as Consulting Physician (Pulmonary Disease) [facility], [facility]) Tobie Priest, MD as Consulting Physician (Orthopedic Surgery) Perla Evalene PARAS, MD as Consulting Physician (Cardiology) Dasher, Alm LABOR, MD (Dermatology) Janit Thresa HERO, DPM as Consulting Physician (Podiatry)  I have personally reviewed and noted the following in the patients chart:   Medical and social history Use of alcohol, tobacco or illicit drugs  Current medications and supplements including opioid prescriptions. Functional ability and status Nutritional status Physical activity Advanced directives List of other physicians Hospitalizations, surgeries, and ER visits in previous 12 months Vitals Screenings to include cognitive, depression, and falls Referrals and appointments  Orders Placed This Encounter  Procedures   MM 3D SCREENING MAMMOGRAM BILATERAL BREAST    Standing Status:   Future    Expected Date:   02/03/2025    Expiration Date:   09/16/2025    Scheduling Instructions:     Would like  to schedule in the same visit as DEXA    Reason for Exam (SYMPTOM  OR DIAGNOSIS REQUIRED):   screening for breast cancer    Preferred imaging location?:   [facility]   DG Bone Density    Standing Status:   Future    Expected Date:   12/25/2024    Expiration Date:   09/16/2025    Scheduling Instructions:     Would like to schedule same visit as MMG (~02/03/25)    Reason for Exam (SYMPTOM  OR DIAGNOSIS REQUIRED):   postmenopausal estrogen deficiency    Preferred imaging location?:    [facility]   In addition, I have reviewed and discussed with patient certain preventive protocols, quality metrics, and best practice recommendations. A written personalized care plan for preventive services as well as general preventive health recommendations were provided to patient.   Vina Ned, CMA   09/16/2024   Return in 53 weeks (on 09/22/2025) for Medicare Annual Wellness Visit.  After Visit Summary: (Mail) Due to this being a telephonic visit, the after visit summary with patients personalized plan was offered to patient via mail   Nurse Notes:  Placed orders for MMG (due ~02/03/25) and DEXA (due ~12/25/24) Patient will schedule both when MMG is due. UTD on Covid vaccine per patient (received 07/24/24) Screening colonoscopy no longer recommended due to age.

## 2024-09-17 ENCOUNTER — Other Ambulatory Visit: Payer: Self-pay | Admitting: Cardiovascular Disease

## 2024-09-17 DIAGNOSIS — I4891 Unspecified atrial fibrillation: Secondary | ICD-10-CM

## 2024-11-02 ENCOUNTER — Ambulatory Visit: Admitting: Family Medicine

## 2024-12-03 ENCOUNTER — Ambulatory Visit: Admitting: Family Medicine

## 2025-02-08 ENCOUNTER — Ambulatory Visit: Admitting: Internal Medicine

## 2025-09-22 ENCOUNTER — Ambulatory Visit
